# Patient Record
Sex: Female | Born: 1960 | Race: White | Hispanic: No | Marital: Married | Smoking: Never smoker
Health system: Southern US, Community
[De-identification: ages and names within clinical notes are randomized; demographics above are authoritative.]

## PROBLEM LIST (undated history)

## (undated) DIAGNOSIS — G35D Multiple sclerosis, unspecified: Secondary | ICD-10-CM

## (undated) DIAGNOSIS — I10 Essential (primary) hypertension: Secondary | ICD-10-CM

## (undated) DIAGNOSIS — M549 Dorsalgia, unspecified: Secondary | ICD-10-CM

## (undated) DIAGNOSIS — J45909 Unspecified asthma, uncomplicated: Secondary | ICD-10-CM

## (undated) DIAGNOSIS — F329 Major depressive disorder, single episode, unspecified: Secondary | ICD-10-CM

## (undated) DIAGNOSIS — M791 Myalgia, unspecified site: Secondary | ICD-10-CM

## (undated) DIAGNOSIS — H209 Unspecified iridocyclitis: Secondary | ICD-10-CM

## (undated) DIAGNOSIS — I314 Cardiac tamponade: Secondary | ICD-10-CM

## (undated) DIAGNOSIS — F32A Depression, unspecified: Secondary | ICD-10-CM

## (undated) DIAGNOSIS — E669 Obesity, unspecified: Secondary | ICD-10-CM

## (undated) DIAGNOSIS — G35 Multiple sclerosis: Secondary | ICD-10-CM

## (undated) DIAGNOSIS — G4761 Periodic limb movement disorder: Secondary | ICD-10-CM

## (undated) DIAGNOSIS — J90 Pleural effusion, not elsewhere classified: Secondary | ICD-10-CM

## (undated) HISTORY — DX: Myalgia, unspecified site: M79.10

## (undated) HISTORY — DX: Unspecified asthma, uncomplicated: J45.909

## (undated) HISTORY — DX: Obesity, unspecified: E66.9

## (undated) HISTORY — DX: Major depressive disorder, single episode, unspecified: F32.9

## (undated) HISTORY — DX: Essential (primary) hypertension: I10

## (undated) HISTORY — DX: Dorsalgia, unspecified: M54.9

## (undated) HISTORY — DX: Unspecified iridocyclitis: H20.9

## (undated) HISTORY — DX: Periodic limb movement disorder: G47.61

## (undated) HISTORY — PX: CERVICAL FUSION: SHX112

## (undated) HISTORY — DX: Depression, unspecified: F32.A

---

## 2010-08-11 HISTORY — PX: OTHER SURGICAL HISTORY: SHX169

## 2013-11-17 ENCOUNTER — Encounter: Payer: Self-pay | Admitting: Neurology

## 2013-11-21 ENCOUNTER — Encounter (INDEPENDENT_AMBULATORY_CARE_PROVIDER_SITE_OTHER): Payer: Self-pay

## 2013-11-21 ENCOUNTER — Ambulatory Visit (INDEPENDENT_AMBULATORY_CARE_PROVIDER_SITE_OTHER): Payer: BC Managed Care – PPO | Admitting: Neurology

## 2013-11-21 ENCOUNTER — Encounter: Payer: Self-pay | Admitting: Neurology

## 2013-11-21 VITALS — BP 121/82 | HR 67 | Ht 70.0 in | Wt 265.0 lb

## 2013-11-21 DIAGNOSIS — G35 Multiple sclerosis: Secondary | ICD-10-CM

## 2013-11-21 DIAGNOSIS — Z5181 Encounter for therapeutic drug level monitoring: Secondary | ICD-10-CM

## 2013-11-21 DIAGNOSIS — G35D Multiple sclerosis, unspecified: Secondary | ICD-10-CM

## 2013-11-21 NOTE — Progress Notes (Signed)
Reason for visit: Multiple sclerosis  [name] is a 53 y.o. female  History of present illness:  [name] is a 53 year old right-handed white female with a history of multiple sclerosis. The patient was initially diagnosed in 2000. The patient had an episode of left sided optic neuritis in 1996, and then she had left-sided sensory changes in 2000, and a lumbar puncture confirmed the diagnosis of MS. The patient has had multiple white matter lesions involving the brain and the spinal cord. The patient has been treated initially with Avonex for one year, and was switched to Rebif until December 2014. The patient had an exacerbation approximately one year ago associated with a cold sensation on the body and gait problems. The patient was found to have an enhancing lesion of the brain. The patient was switched to Tysabri, and she has been on this for a total of 6 doses. The patient is tolerating the medication well. The patient has a history of uveitis bilaterally, and some blurring of vision associated with this. The patient reports no current symptoms of numbness, or weakness or significant balance issues. The patient denies problems controlling the bowels or the bladder. The patient does have some neck discomfort at times. The patient has just recently moved to this area, and she does not have a primary care physician.  Past Medical History  Diagnosis Date  . Asthma   . Hypertension   . Depression   . Back pain   . Muscle pain   . Obesity   . Uveitis     Bilateral    Past Surgical History  Procedure Laterality Date  . Uterine ablasion  2012    Family History  Problem Relation Age of Onset  . Breast cancer Mother   . Hypertension Mother   . Diabetes Mother     Social history:  reports that she has never smoked. She has never used smokeless tobacco. She reports that she does not drink alcohol or use illicit drugs.  Medications:  No current outpatient prescriptions on file  prior to visit.   No current facility-administered medications on file prior to visit.      Allergies  Allergen Reactions  . Penicillins     ROS:  Out of a complete 14 system review of symptoms, the patient complains only of the following symptoms, and all other reviewed systems are negative.  Blurred vision Allergies Depression Disinterest in activities  Blood pressure 121/82, pulse 67, height 5\' 10"  (1.778 m), weight 265 lb (120.203 kg).  Physical Exam  General: The patient is alert and cooperative at the time of the examination.The patient is moderately obese.  Eyes: Pupils are equal, round, and reactive to light. Discs are flat bilaterally.  Neck: The neck is supple, no carotid bruits are noted.  Respiratory: The respiratory examination is clear.  Cardiovascular: The cardiovascular examination reveals a regular rate and rhythm, no obvious murmurs or rubs are noted.  Skin: Extremities are without significant edema.  Neurologic Exam  Mental status: The patient is alert and oriented x 3 at the time of the examination. The patient has apparent normal recent and remote memory, with an apparently normal attention span and concentration ability.  Cranial nerves: Facial symmetry is present. There is good sensation of the face to pinprick and soft touch bilaterally. The strength of the facial muscles and the muscles to head turning and shoulder shrug are normal bilaterally. Speech is well enunciated, no aphasia or dysarthria is noted. Extraocular movements  are full. Visual fields are full. The tongue is midline, and the patient has symmetric elevation of the soft palate. No obvious hearing deficits are noted.  Motor: The motor testing reveals 5 over 5 strength of all 4 extremities. Good symmetric motor tone is noted throughout.  Sensory: Sensory testing is intact to pinprick, soft touch, vibration sensation, and position sense on all 4 extremities. No evidence of extinction is  noted.  Coordination: Cerebellar testing reveals good finger-nose-finger and heel-to-shin bilaterally.  Gait and station: Gait is normal. Tandem gait is normal. Romberg is negative. No drift is seen.  Reflexes: Deep tendon reflexes are symmetric and normal bilaterally. Toes are downgoing bilaterally.   Assessment/Plan:  One. Multiple sclerosis  The patient will continue on the Tysabri. The patient will have blood work done today, and MRI evaluation of the brain and cervical spinal cord will be done. The patient will followup in 6 months. The patient is to contact our office if any new events occur. We will refer the patient to a primary care physician.    Jill Alexanders MD 11/21/2013 6:53 PM  [facility] [address] [place], [place] [address]  Phone [phone] Fax [phone]

## 2013-11-21 NOTE — Patient Instructions (Signed)
Multiple Sclerosis  Multiple sclerosis (MS) is a disease of the central nervous system. It leads to loss of the insulating covering of the nerves (myelin sheath) of your brain. When this happens, brain signals do not get transmitted properly or may not get transmitted at all. The symptoms of MS occur in episodes or attacks. These attacks may last weeks to months. There may be long periods of nearly no problems between attacks. The age of onset of MS varies.   CAUSES  The cause of MS is unknown. However, it is more common in the [place] than in the southern [place].  RISK FACTORS  There is a higher incidence of MS in women than in men. MS is not an inherited illness, although your risk of MS is higher if you have a relative with MS.  SIGNS AND SYMPTOMS   The symptoms of MS occur in episodes or attacks. These attacks may last weeks to months. There may be long periods of almost no symptoms between attacks.  The symptoms of MS vary. This is because of the many different ways it affects the central nervous system. The main symptoms of MS include:   Vision problems and eye pain.   Numbness.   Weakness.   Paralysis in your arms, hands, feet, and legs (extremities).   Balance problems.   Tremors.  DIAGNOSIS   Your health care provider can diagnose MS with the help of imaging exams and lab tests. These may include specialized X-ray exams and spinal fluid tests. The best imaging exam to confirm a diagnosis of MS is MRI.  TREATMENT   There is no known cure for MS, but there are medicines that can decrease the number and frequency of attacks. Steroids are often used for short-term relief. Physical and occupational therapy may also help.  HOME CARE INSTRUCTIONS    Take medicines as directed by your health care provider.   Exercise as directed by your health care provider.  SEEK MEDICAL CARE IF:  You begin to feel depressed.  SEEK IMMEDIATE MEDICAL CARE IF:   You develop paralysis.   You develop  problems with bladder, bowel, or sexual function.   You develop mental changes, such as forgetfulness or mood swings.   You have a seizure.  Document Released: 07/25/2000 Document Revised: 05/18/2013 Document Reviewed: 04/04/2013  ExitCare Patient Information 2014 [employer].

## 2013-11-22 LAB — COMPREHENSIVE METABOLIC PANEL
ALT: 37 IU/L — ABNORMAL HIGH (ref 0–32)
AST: 25 IU/L (ref 0–40)
Albumin/Globulin Ratio: 1.4 (ref 1.1–2.5)
Albumin: 4.4 g/dL (ref 3.5–5.5)
Alkaline Phosphatase: 100 IU/L (ref 39–117)
BUN/Creatinine Ratio: 24 — ABNORMAL HIGH (ref 9–23)
BUN: 17 mg/dL (ref 6–24)
CALCIUM: 9.7 mg/dL (ref 8.7–10.2)
CO2: 28 mmol/L (ref 18–29)
Chloride: 100 mmol/L (ref 97–108)
Creatinine, Ser: 0.71 mg/dL (ref 0.57–1.00)
GFR calc Af Amer: 115 mL/min/{1.73_m2} (ref >59)
GFR, EST NON AFRICAN AMERICAN: 100 mL/min/{1.73_m2} (ref >59)
GLOBULIN, TOTAL: 3.1 g/dL (ref 1.5–4.5)
GLUCOSE: 91 mg/dL (ref 65–99)
Potassium: 4 mmol/L (ref 3.5–5.2)
Sodium: 140 mmol/L (ref 134–144)
TOTAL PROTEIN: 7.5 g/dL (ref 6.0–8.5)
Total Bilirubin: 0.3 mg/dL (ref 0.0–1.2)

## 2013-11-22 LAB — CBC WITH DIFFERENTIAL
BASOS: 0 %
Basophils Absolute: 0 10*3/uL (ref 0.0–0.2)
EOS: 2 %
Eosinophils Absolute: 0.2 10*3/uL (ref 0.0–0.4)
HEMATOCRIT: 40.7 % (ref 34.0–46.6)
HEMOGLOBIN: 13.5 g/dL (ref 11.1–15.9)
Immature Grans (Abs): 0 10*3/uL (ref 0.0–0.1)
Immature Granulocytes: 0 %
Lymphocytes Absolute: 3.7 10*3/uL — ABNORMAL HIGH (ref 0.7–3.1)
Lymphs: 50 %
MCH: 29.5 pg (ref 26.6–33.0)
MCHC: 33.2 g/dL (ref 31.5–35.7)
MCV: 89 fL (ref 79–97)
MONOS ABS: 0.6 10*3/uL (ref 0.1–0.9)
Monocytes: 8 %
NEUTROS ABS: 3 10*3/uL (ref 1.4–7.0)
Neutrophils Relative %: 40 %
Platelets: 220 10*3/uL (ref 150–379)
RBC: 4.58 x10E6/uL (ref 3.77–5.28)
RDW: 13.7 % (ref 12.3–15.4)
WBC: 7.5 10*3/uL (ref 3.4–10.8)

## 2013-11-30 ENCOUNTER — Telehealth: Payer: Self-pay | Admitting: Neurology

## 2013-11-30 NOTE — Telephone Encounter (Signed)
The JC virus antibody panel came back as negative. We will recheck in about 6 months. The patient remains on Tysabri.

## 2013-12-01 ENCOUNTER — Ambulatory Visit (INDEPENDENT_AMBULATORY_CARE_PROVIDER_SITE_OTHER): Payer: BC Managed Care – PPO

## 2013-12-01 DIAGNOSIS — G35 Multiple sclerosis: Secondary | ICD-10-CM

## 2013-12-01 MED ORDER — GADOPENTETATE DIMEGLUMINE 469.01 MG/ML IV SOLN: 20.0000 mL | Freq: Once | INTRAVENOUS | Status: AC | PRN

## 2013-12-02 ENCOUNTER — Telehealth: Payer: Self-pay | Admitting: Neurology

## 2013-12-02 ENCOUNTER — Other Ambulatory Visit: Payer: Self-pay | Admitting: Neurology

## 2013-12-02 DIAGNOSIS — G35 Multiple sclerosis: Secondary | ICD-10-CM

## 2013-12-02 NOTE — Telephone Encounter (Signed)
I called patient. The MRI of the brain shows lesions consistent with multiple sclerosis, no enhancing lesions. MRI of the cervical spine was done, no Cord abnormalities are noted. The patient is supposed to go on Tysabri, she has not heard anything yet. I'll try to make sure that this gets set up.   MRI brain April 24th 2015:  IMPRESSION: Abnormal MRI scan the brain showing multiple periventricular, corpus callosal, brainstem and cerebellar white matter hyperintensities typical for multiple sclerosis. No enhancing lesions are noted. There mild changes of incidental paranasal sinusitis and left maxillary sinus polyp/mucus retention cyst is noted.

## 2013-12-05 ENCOUNTER — Telehealth: Payer: Self-pay | Admitting: Neurology

## 2013-12-05 NOTE — Telephone Encounter (Signed)
Left message for patient to see if she had signed Tysabri form.  It seems she has been on medication, but may need new enrollment for a different location since she has just moved into the area.

## 2013-12-06 ENCOUNTER — Telehealth: Payer: Self-pay | Admitting: Neurology

## 2013-12-06 ENCOUNTER — Telehealth: Payer: Self-pay | Admitting: *Deleted

## 2013-12-06 NOTE — Telephone Encounter (Signed)
Called patient regarding a desk she stated she had. Waiting on her to return my call IM checking to see if she took her desk home provider would like to get the disk.

## 2013-12-06 NOTE — Telephone Encounter (Signed)
I called the patient. MRI of the cervical spine does not show cord lesions. MRI the brain shows chronic multiple brain lesions consistent with MS. No enhancing lesions are noted. No comparison studies.   MRI cervical spine December 06 2013:  IMPRESSION: Abnormal MRI scan of the cervical spine showing prominent spondylitic changes at C4-5, C5-6 and C6-7 and broad-based disc osteophyte bulges and mild bilateral foraminal narrowing but without significant compression .No enhancing lesions are noted.   MRI brain 12/06/2013:   IMPRESSION: Abnormal MRI scan the brain showing multiple periventricular, corpus callosal, brainstem and cerebellar white matter hyperintensities typical for multiple sclerosis. No enhancing lesions are noted. There mild changes of incidental paranasal sinusitis and left maxillary sinus polyp/mucus retention cyst is noted.

## 2013-12-14 ENCOUNTER — Telehealth: Payer: Self-pay | Admitting: Neurology

## 2013-12-14 NOTE — Telephone Encounter (Signed)
Left another message for patient, that to change infusion sites a new form may need to be completed.  Asked her to return call.

## 2013-12-14 NOTE — Telephone Encounter (Signed)
Patient is returning call.  °

## 2013-12-26 NOTE — Telephone Encounter (Signed)
Left message that we received a "Change Prescriber authorization form by [employer] to be signed by the doctor.  Once she is authorized to receive Tysabri, I will call [facility] to set up her infusion if necessary.

## 2014-01-13 ENCOUNTER — Telehealth: Payer: Self-pay | Admitting: Neurology

## 2014-01-13 MED ORDER — DOXEPIN HCL 25 MG PO CAPS: 25.0000 mg | ORAL_CAPSULE | Freq: Every day | ORAL | Status: DC

## 2014-01-13 NOTE — Telephone Encounter (Signed)
I will call in the doxepin.

## 2014-01-13 NOTE — Telephone Encounter (Signed)
Patient requesting Rx for doxepin (SINEQUAN) 10 MG capsule 25 mg instead of 10 mg.  Thanks

## 2014-01-13 NOTE — Telephone Encounter (Signed)
I called back and spoke with the patient.  She said she gave [place] the wrong dose of Doxepin when she completed her forms.  States she takes 25mg  once daily, not 10mg  twice daily.  This has now been updated on med list.  Says she still does not have a PCP and would like to know if Dr Jannifer Franklin would prescribe this drug. Please advise.  Thank you.

## 2014-01-16 NOTE — Telephone Encounter (Signed)
I have faxed predetermination forms to [employer] [place] and called patient that we were waiting for a response.  Patient relayed that she changed insurance to [employer] on 01-09-14 and notified [facility] program.  I will also call [employer] and see if summary of benefits has been completed.

## 2014-01-17 ENCOUNTER — Telehealth: Payer: Self-pay | Admitting: Neurology

## 2014-01-17 NOTE — Telephone Encounter (Signed)
[name] with [employer] calling to verify the duration of Tysabri order received.  Please return call.  [phone]

## 2014-01-18 NOTE — Telephone Encounter (Signed)
Left message for Deneise Lever with [employer] and relayed that the patient has changed insurance companies effective 01-09-14.  When I sent determination of coverage form I was not aware [employer] was no longer effective.

## 2014-01-18 NOTE — Telephone Encounter (Signed)
Spoke to [employer] program and they have all the information for the new insurance, and a summary of benefits from [employer].  The patient has a $650 deductible to meet and yearly out of pocket of $4000.  They are going to contact patient for her to speak to their financial dept.    Also spoke to [employer] and representative, Langley Gauss, said the patient does not need a PA for the infusion or medication.  Left message for patient to return call, and inform me if she wants her first appointment at [facility] made, or to wait until she speaks to financial team at [employer].

## 2014-01-23 ENCOUNTER — Telehealth: Payer: Self-pay | Admitting: Nurse Practitioner

## 2014-01-23 NOTE — Telephone Encounter (Signed)
[name] with [place], calling [name] to check status for appointment at [facility] for [employer].  Please return call [phone].

## 2014-01-24 ENCOUNTER — Telehealth: Payer: Self-pay | Admitting: Neurology

## 2014-01-24 ENCOUNTER — Other Ambulatory Visit: Payer: Self-pay | Admitting: Neurology

## 2014-01-24 DIAGNOSIS — G35 Multiple sclerosis: Secondary | ICD-10-CM

## 2014-01-24 NOTE — Telephone Encounter (Signed)
Spoke to [place] with [place] and patient has enrolled in their financial assistance program, so she is ready to be scheduled.  I have set up her Tysabri infusion appointment at [facility] for 02-17-14 at 8am.  Left detailed message for patient about her appointment at short stay, and told her if she needed it changed, to call [facility] at [facility] directly at [phone].  Also told her to call with any other questions.

## 2014-01-24 NOTE — Telephone Encounter (Signed)
Patient has been scheduled for her Tysabri infusion at [facility] on 02-1014.  See phone note 01-23-14.

## 2014-01-24 NOTE — Telephone Encounter (Signed)
I will place the orders for Tysabri.

## 2014-01-24 NOTE — Telephone Encounter (Signed)
Patient has been scheduled for her first infusion of Tysabri at [facility] on 02-17-14.   Left message for patient with this appointment.  Short stay asked that orders be placed in Epic.

## 2014-01-27 ENCOUNTER — Other Ambulatory Visit: Payer: Self-pay | Admitting: Family Medicine

## 2014-01-27 DIAGNOSIS — Z1231 Encounter for screening mammogram for malignant neoplasm of breast: Secondary | ICD-10-CM

## 2014-02-06 ENCOUNTER — Telehealth: Payer: Self-pay | Admitting: Neurology

## 2014-02-06 MED ORDER — ARMODAFINIL 150 MG PO TABS: 150.0000 mg | ORAL_TABLET | Freq: Every day | ORAL | Status: DC

## 2014-02-06 NOTE — Telephone Encounter (Signed)
Patient calling to state that she would like Dr. Jannifer Franklin to refill her Rockie Neighbours (this would be a new prescription, Dr. Jannifer Franklin has never refilled this before). Please call patient and advise.

## 2014-02-06 NOTE — Telephone Encounter (Signed)
I will write a prescription for Nuvigil

## 2014-02-06 NOTE — Telephone Encounter (Signed)
Patient indicates she does not have a PCP/other provider at this time and would like to know if we will write Rx for Nuvigil.  Please advise.  Thank you.

## 2014-02-08 ENCOUNTER — Ambulatory Visit
Admission: RE | Admit: 2014-02-08 | Discharge: 2014-02-08 | Disposition: A | Discharge status: Home / Self Care | Payer: 59 | Source: Ambulatory Visit | Attending: Family Medicine | Admitting: Family Medicine

## 2014-02-08 DIAGNOSIS — Z1231 Encounter for screening mammogram for malignant neoplasm of breast: Secondary | ICD-10-CM

## 2014-02-17 ENCOUNTER — Encounter (HOSPITAL_COMMUNITY): Payer: Self-pay

## 2014-02-17 ENCOUNTER — Encounter (HOSPITAL_COMMUNITY)
Admission: RE | Admit: 2014-02-17 | Discharge: 2014-02-17 | Disposition: A | Discharge status: Home / Self Care | Payer: 59 | Source: Ambulatory Visit | Attending: Neurology | Admitting: Neurology

## 2014-02-17 ENCOUNTER — Other Ambulatory Visit (HOSPITAL_COMMUNITY): Payer: Self-pay | Admitting: Neurology

## 2014-02-17 VITALS — BP 136/80 | HR 63 | Temp 97.3°F | Resp 18 | Ht 70.0 in | Wt 257.4 lb

## 2014-02-17 DIAGNOSIS — G35 Multiple sclerosis: Secondary | ICD-10-CM

## 2014-02-17 DIAGNOSIS — D509 Iron deficiency anemia, unspecified: Secondary | ICD-10-CM | POA: Diagnosis present

## 2014-02-17 MED ORDER — SODIUM CHLORIDE 0.9 % IV SOLN
INTRAVENOUS | Status: DC
  Administered 2014-02-17: 09:00:00 via INTRAVENOUS

## 2014-02-17 MED ORDER — LORATADINE 10 MG PO TABS
10.0000 mg | ORAL_TABLET | ORAL | Status: DC
  Administered 2014-02-17: 10 mg via ORAL
  Filled 2014-02-17: qty 1

## 2014-02-17 MED ORDER — ACETAMINOPHEN 325 MG PO TABS
650.0000 mg | ORAL_TABLET | ORAL | Status: DC
  Administered 2014-02-17: 650 mg via ORAL
  Filled 2014-02-17: qty 2

## 2014-02-17 MED ORDER — SODIUM CHLORIDE 0.9 % IV SOLN
300.0000 mg | INTRAVENOUS | Status: DC
  Administered 2014-02-17: 300 mg via INTRAVENOUS
  Filled 2014-02-17: qty 15

## 2014-02-17 NOTE — Discharge Instructions (Signed)
ATTENTION:  If you are going to be 15 or more minutes late for your appointment, please call [phone] to make other arrangements for your treatment.  If you arrive early for your schedule appointment, you may have to wait until your scheduled time.  Natalizumab injection What is this medicine? NATALIZUMAB (na ta LIZ you mab) is used to treat relapsing multiple sclerosis. This drug is not a cure. It is also used to treat Crohn's disease. This medicine may be used for other purposes; ask your health care provider or pharmacist if you have questions. COMMON BRAND NAME(S): Tysabri What should I tell my health care provider before I take this medicine? They need to know if you have any of these conditions: -immune system problems -progressive multifocal leukoencephalopathy (PML) -an unusual or allergic reaction to natalizumab, other medicines, foods, dyes, or preservatives -pregnant or trying to get pregnant -breast-feeding How should I use this medicine? This medicine is for infusion into a vein. It is given by a health care professional in a hospital or clinic setting. A special MedGuide will be given to you by the pharmacist with each prescription and refill. Be sure to read this information carefully each time. Talk to your pediatrician regarding the use of this medicine in children. This medicine is not approved for use in children. Overdosage: If you think you have taken too much of this medicine contact a poison control center or emergency room at once. NOTE: This medicine is only for you. Do not share this medicine with others. What if I miss a dose? It is important not to miss your dose. Call your doctor or health care professional if you are unable to keep an appointment. What may interact with this medicine? -azathioprine -cyclosporine -interferon -6-mercaptopurine -methotrexate -steroid medicines like prednisone or cortisone -TNF-alpha inhibitors like adalimumab,  etanercept, and infliximab -vaccines This list may not describe all possible interactions. Give your health care provider a list of all the medicines, herbs, non-prescription drugs, or dietary supplements you use. Also tell them if you smoke, drink alcohol, or use illegal drugs. Some items may interact with your medicine. What should I watch for while using this medicine? Your condition will be monitored carefully while you are receiving this medicine. Visit your doctor for regular check ups. Tell your doctor or healthcare professional if your symptoms do not start to get better or if they get worse. Stay away from people who are sick. Call your doctor or health care professional for advice if you get a fever, chills or sore throat, or other symptoms of a cold or flu. Do not treat yourself. In some patients, this medicine may cause a serious brain infection that may cause death. If you have any problems seeing, thinking, speaking, walking, or standing, tell your doctor right away. If you cannot reach your doctor, get urgent medical care. What side effects may I notice from receiving this medicine? Side effects that you should report to your doctor or health care professional as soon as possible: -allergic reactions like skin rash, itching or hives, swelling of the face, lips, or tongue -breathing problems -changes in vision -chest pain -dark urine -depression, feelings of sadness -dizziness -general ill feeling or flu-like symptoms -irregular, missed, or painful menstrual periods -light-colored stools -loss of appetite, nausea -muscle weakness -problems with balance, talking, or walking -right upper belly pain -unusually weak or tired -yellowing of the eyes or skin Side effects that usually do not require medical attention (report to your  doctor or health care professional if they continue or are bothersome): -aches, pains -headache -stomach upset -tiredness This list may not describe  all possible side effects. Call your doctor for medical advice about side effects. You may report side effects to FDA at 1-800-FDA-1088. Where should I keep my medicine? This drug is given in a hospital or clinic and will not be stored at home. NOTE: This sheet is a summary. It may not cover all possible information. If you have questions about this medicine, talk to your doctor, pharmacist, or health care provider.  2015, Elsevier/Gold Standard. (2008-09-16 13:33:21)

## 2014-03-16 ENCOUNTER — Telehealth: Payer: Self-pay | Admitting: Neurology

## 2014-03-16 NOTE — Telephone Encounter (Signed)
Pt's information was given to [name], RN, Dr. Jannifer Franklin nurse. Please advise

## 2014-03-16 NOTE — Telephone Encounter (Signed)
Patient stated she's having Cataract Surgery on August 10 th.  She's also scheduled for Infusion at [facility] short stay on the August 10th as well.  [place] informed her our office need to call and reschedule appt.

## 2014-03-16 NOTE — Telephone Encounter (Signed)
Left message for patient that [facility] at [facility] already cancelled her Tysabri infusion appointment for 03-20-14.  Asked her to call our office after her cataract surgery to let us know how she is doing, or if she is on any antibiotics/medicines so we can clear her before she reschedules her next infusion appointment.

## 2014-03-20 ENCOUNTER — Encounter (HOSPITAL_COMMUNITY): Admission: RE | Admit: 2014-03-20 | Payer: 59 | Source: Ambulatory Visit

## 2014-04-03 ENCOUNTER — Telehealth: Payer: Self-pay | Admitting: Neurology

## 2014-04-03 NOTE — Telephone Encounter (Signed)
Patient would like to proceed with Infusions.  Please call anytime and advise, may leave message if not available.

## 2014-04-03 NOTE — Telephone Encounter (Signed)
Patient would like to proceed with Infusions. Please call anytime and advise, may leave message if not available.

## 2014-04-04 NOTE — Telephone Encounter (Signed)
Okay to start the Tysabri at this time.

## 2014-04-04 NOTE — Telephone Encounter (Signed)
Spoke to patient and she had her cataract surgery on 03-20-14 and has been for her follow up with the eye doctor.  She is not on any antibiodics and is ready to get rescheduled for her Tysabri.   Also contacted Dee at [facility] and she stated they still need a clearance from Dr. Jannifer Franklin before she has the infusion.

## 2014-04-05 NOTE — Telephone Encounter (Signed)
Spoke to [name] at [facility] and confirmed that the patient can start the Tysabri.  Also left message for patient that she is already scheduled for 04-24-14 and she can call [facility] directly to confirm or change the appointment.

## 2014-04-20 ENCOUNTER — Encounter (HOSPITAL_COMMUNITY)
Admission: RE | Admit: 2014-04-20 | Discharge: 2014-04-20 | Disposition: A | Discharge status: Home / Self Care | Payer: 59 | Source: Ambulatory Visit | Attending: Neurology | Admitting: Neurology

## 2014-04-20 ENCOUNTER — Encounter (HOSPITAL_COMMUNITY): Payer: Self-pay

## 2014-04-20 VITALS — BP 145/80 | HR 60 | Temp 97.8°F | Resp 18

## 2014-04-20 DIAGNOSIS — G35 Multiple sclerosis: Secondary | ICD-10-CM

## 2014-04-20 DIAGNOSIS — D509 Iron deficiency anemia, unspecified: Secondary | ICD-10-CM | POA: Diagnosis present

## 2014-04-20 MED ORDER — ACETAMINOPHEN 325 MG PO TABS
650.0000 mg | ORAL_TABLET | ORAL | Status: DC
  Administered 2014-04-20: 650 mg via ORAL
  Filled 2014-04-20: qty 2

## 2014-04-20 MED ORDER — SODIUM CHLORIDE 0.9 % IV SOLN
300.0000 mg | INTRAVENOUS | Status: DC
  Administered 2014-04-20: 300 mg via INTRAVENOUS
  Filled 2014-04-20: qty 15

## 2014-04-20 MED ORDER — SODIUM CHLORIDE 0.9 % IV SOLN
INTRAVENOUS | Status: DC
  Administered 2014-04-20: 08:00:00 via INTRAVENOUS

## 2014-04-20 MED ORDER — LORATADINE 10 MG PO TABS
10.0000 mg | ORAL_TABLET | ORAL | Status: DC
  Administered 2014-04-20: 10 mg via ORAL
  Filled 2014-04-20: qty 1

## 2014-04-20 NOTE — Progress Notes (Signed)
tysabri infusion and 1 hour observation is complete.  Pt tolerated well, vss, afebrile.  Pt d/c home ambulatory with friend.

## 2014-04-21 ENCOUNTER — Encounter (HOSPITAL_COMMUNITY): Payer: 59

## 2014-05-02 ENCOUNTER — Telehealth: Payer: Self-pay | Admitting: Neurology

## 2014-05-02 MED ORDER — DOXEPIN HCL 50 MG PO CAPS: 50.0000 mg | ORAL_CAPSULE | Freq: Every day | ORAL | Status: DC

## 2014-05-02 NOTE — Telephone Encounter (Signed)
Patient stated she's having a Neuropathy flare up, unable to sleep and experiencing a lot of pain.  Questioning if she could increase dosage of doxepin (SINEQUAN) 25 MG capsule.  Please call and advise, may leave detailed message on voicemail if not available.

## 2014-05-02 NOTE — Telephone Encounter (Signed)
I called patient. The patient is having some increased problems with pain and difficulty sleeping. We will go up on the doxepin taking 50 mg at night. In the past, the patient has been on much higher doses, and it has been helpful for her.

## 2014-05-02 NOTE — Telephone Encounter (Signed)
Please advise previous note. Thanks  °

## 2014-05-08 ENCOUNTER — Other Ambulatory Visit: Payer: Self-pay

## 2014-05-08 MED ORDER — DOXEPIN HCL 50 MG PO CAPS: 50.0000 mg | ORAL_CAPSULE | Freq: Every day | ORAL | Status: DC

## 2014-05-08 MED ORDER — ARMODAFINIL 150 MG PO TABS: 150.0000 mg | ORAL_TABLET | Freq: Every day | ORAL | Status: DC

## 2014-05-08 NOTE — Telephone Encounter (Signed)
Dr Willis is out of the office.  Forwarding request to [facility] for review.  

## 2014-05-09 NOTE — Telephone Encounter (Signed)
Rx signed and faxed.

## 2014-05-18 ENCOUNTER — Telehealth: Payer: Self-pay | Admitting: Neurology

## 2014-05-18 NOTE — Telephone Encounter (Signed)
Dee from short stay requesting a call back regarding patient and Tysabri, please return her call and advise.

## 2014-05-19 ENCOUNTER — Other Ambulatory Visit: Payer: Self-pay | Admitting: Neurology

## 2014-05-19 DIAGNOSIS — G35 Multiple sclerosis: Secondary | ICD-10-CM

## 2014-05-19 NOTE — Telephone Encounter (Signed)
[facility] called and needs orders for Tysabri in Epic, patient's appointment is 05-23-14.

## 2014-05-19 NOTE — Telephone Encounter (Signed)
I will write a prescription. The patient will be seen in the office next week.

## 2014-05-22 ENCOUNTER — Encounter (HOSPITAL_COMMUNITY): Payer: 59

## 2014-05-23 ENCOUNTER — Encounter: Payer: Self-pay | Admitting: Adult Health

## 2014-05-23 ENCOUNTER — Ambulatory Visit (INDEPENDENT_AMBULATORY_CARE_PROVIDER_SITE_OTHER): Payer: 59 | Admitting: Adult Health

## 2014-05-23 ENCOUNTER — Encounter (HOSPITAL_COMMUNITY): Payer: Self-pay

## 2014-05-23 ENCOUNTER — Encounter (HOSPITAL_COMMUNITY)
Admission: RE | Admit: 2014-05-23 | Discharge: 2014-05-23 | Disposition: A | Discharge status: Home / Self Care | Payer: 59 | Source: Ambulatory Visit | Attending: Neurology | Admitting: Neurology

## 2014-05-23 VITALS — BP 106/72 | HR 72 | Temp 98.7°F | Resp 14

## 2014-05-23 VITALS — BP 133/85 | HR 73 | Ht 68.5 in | Wt 267.0 lb

## 2014-05-23 DIAGNOSIS — Z5181 Encounter for therapeutic drug level monitoring: Secondary | ICD-10-CM

## 2014-05-23 DIAGNOSIS — G35 Multiple sclerosis: Secondary | ICD-10-CM | POA: Diagnosis present

## 2014-05-23 MED ORDER — NATALIZUMAB 300 MG/15ML IV CONC
300.0000 mg | INTRAVENOUS | Status: DC
  Administered 2014-05-23: 300 mg via INTRAVENOUS
  Filled 2014-05-23: qty 15

## 2014-05-23 MED ORDER — LORATADINE 10 MG PO TABS
10.0000 mg | ORAL_TABLET | ORAL | Status: DC
  Administered 2014-05-23: 10 mg via ORAL
  Filled 2014-05-23: qty 1

## 2014-05-23 MED ORDER — ACETAMINOPHEN 325 MG PO TABS
650.0000 mg | ORAL_TABLET | ORAL | Status: DC
  Administered 2014-05-23: 650 mg via ORAL
  Filled 2014-05-23: qty 2

## 2014-05-23 MED ORDER — SODIUM CHLORIDE 0.9 % IV SOLN
INTRAVENOUS | Status: AC
  Administered 2014-05-23: 11:00:00 via INTRAVENOUS

## 2014-05-23 NOTE — Progress Notes (Signed)
Scott: [name] DOB: 1961-05-05  REASON FOR VISIT: follow up HISTORY FROM: Scott  HISTORY OF PRESENT ILLNESS: [name] is a 53 year old female with a history of multiple sclerosis. She returns today for follow-up. She is currently taking tysabri and tolerating it well. She reports no new numbness or weakness. Denies any changes in her gait or balance. No changes with her bowels or bladder. 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 She had cataract surgery several weeks ago. She follows up with her ophthalmologist  regularly. She states that she did have an issue with insomnia but Dr. Jannifer Franklin increase Julie Doxepin and since then it has improved. Fatigue has remained Julie same. She got her 7th dose of tysabri today.   HISTORY 11/21/13 (CW): a 53 year old right-handed white female with a history of multiple sclerosis. [name] was initially diagnosed in 2000. [name] had an episode of left sided optic neuritis in 1996, and then she had left-sided sensory changes in 2000, and a lumbar puncture confirmed Julie diagnosis of MS. [name] has had multiple white matter lesions involving Julie brain and Julie spinal cord. [name] has been treated initially with Avonex for one year, and was switched to Rebif until December 2014. [name] had an exacerbation approximately one year ago associated with a cold sensation on Julie body and gait problems. [name] was found to have an enhancing lesion of Julie brain. [name] was switched to Tysabri, and she has been on this for a total of 6 doses. [name] is tolerating Julie medication well. [name] has a history of uveitis bilaterally, and some blurring of vision associated with this. [name] reports no current symptoms of numbness, or weakness or significant balance issues. [name] denies problems controlling Julie bowels or Julie bladder. [name] does have some  neck discomfort at times. [name] has just recently moved to this area, and she does not have a primary care physician.   REVIEW OF SYSTEMS: Full 14 system review of systems performed and notable only for:  Constitutional: N/A  Eyes: Blurred vision Ear/Nose/Throat: N/A  Skin: N/A  Cardiovascular: N/A  Respiratory: N/A  Gastrointestinal: N/A  Genitourinary: N/A Hematology/Lymphatic: N/A  Endocrine: N/A Musculoskeletal: Back pain, neck pain, next deafness Allergy/Immunology: Environmental allergies  Neurological: N/A Psychiatric: N/A Sleep: Insomnia, frequent waking, daytime sleepiness   ALLERGIES: Allergies  Allergen Reactions  . Penicillins     HOME MEDICATIONS: Outpatient Prescriptions Prior to Visit  Medication Sig Dispense Refill  . Armodafinil (NUVIGIL) 150 MG tablet Take 1 tablet (150 mg total) by mouth daily.  30 tablet  5  . doxepin (SINEQUAN) 50 MG capsule Take 1 capsule (50 mg total) by mouth at bedtime.  90 capsule  1  . Influenza Vac Split Quad (FLUZONE) 0.25 ML injection Inject 0.25 mLs into Julie muscle once.      Marland Kitchen lisinopril (PRINIVIL,ZESTRIL) 2.5 MG tablet Take 2.5 mg by mouth daily.      . natalizumab 300 mg in sodium chloride 0.9 % 100 mL Inject 300 mg into Julie vein every 28 (twenty-eight) days.      . citalopram (CELEXA) 10 MG tablet Take 10 mg by mouth daily.       Facility-Administered Medications Prior to Visit  Medication Dose Route Frequency Provider Last Rate Last Dose  . acetaminophen (TYLENOL) tablet 650 mg  650 mg Oral Q28 days Kathrynn Ducking,  MD   650 mg at 05/23/14 1035  . loratadine (CLARITIN) tablet 10 mg  10 mg Oral Q28 days Kathrynn Ducking, MD   10 mg at 05/23/14 1035  . natalizumab (TYSABRI) 300 mg in sodium chloride 0.9 % 100 mL IVPB  300 mg Intravenous Q28 days Kathrynn Ducking, MD   300 mg at 05/23/14 1039    PAST MEDICAL HISTORY: Past Medical History  Diagnosis Date  . Asthma   . Hypertension   . Depression   . Back pain     . Muscle pain   . Obesity   . Uveitis     Bilateral    PAST SURGICAL HISTORY: Past Surgical History  Procedure Laterality Date  . Uterine ablasion  2012    FAMILY HISTORY: Family History  Problem Relation Age of Onset  . Breast cancer Mother   . Hypertension Mother   . Diabetes Mother     SOCIAL HISTORY: History   Social History  . Marital Status: Single    Spouse Name: N/A    Number of Children: 0  . Years of Education: MA   Occupational History  . [employer]    Social History Main Topics  . Smoking status: Never Smoker   . Smokeless tobacco: Never Used  . Alcohol Use: No  . Drug Use: No     Comment: marijuana  . Sexual Activity: Not on file   Other Topics Concern  . Not on file   Social History Narrative  . No narrative on file      PHYSICAL EXAM  Filed Vitals:   05/23/14 1529  BP: 133/85  Pulse: 73  Height: 5' 8.5" (1.74 m)  Weight: 267 lb (121.11 kg)   Body mass index is 40 kg/(m^2).  Generalized: Well developed, in no acute distress   Neurological examination  Mentation: Alert oriented to time, place, history taking. Follows all commands speech and language fluent Cranial nerve II-XII: Pupils were equal round reactive to light. Extraocular movements were full, visual field were full on confrontational test. Facial sensation and strength were normal. Uvula tongue midline. Head turning and shoulder shrug  were normal and symmetric. Motor: Julie motor testing reveals 5 over 5 strength of all 4 extremities. Good symmetric motor tone is noted throughout.  Sensory: Sensory testing is intact to soft touch on all 4 extremities. No evidence of extinction is noted.  Coordination: Cerebellar testing reveals good finger-nose-finger and heel-to-shin bilaterally.  Gait and station: Gait is normal. Tandem gait is normal. Romberg is negative. No drift is seen.  Reflexes: Deep tendon reflexes are symmetric and normal bilaterally.    DIAGNOSTIC DATA (LABS, IMAGING, TESTING) - I reviewed Scott records, labs, notes, testing and imaging myself where available.  Lab Results  Component Value Date   WBC 7.5 11/21/2013   HGB 13.5 11/21/2013   HCT 40.7 11/21/2013   MCV 89 11/21/2013   PLT 220 11/21/2013      Component Value Date/Time   NA 140 11/21/2013 1524   K 4.0 11/21/2013 1524   CL 100 11/21/2013 1524   CO2 28 11/21/2013 1524   GLUCOSE 91 11/21/2013 1524   BUN 17 11/21/2013 1524   CREATININE 0.71 11/21/2013 1524   CALCIUM 9.7 11/21/2013 1524   PROT 7.5 11/21/2013 1524   AST 25 11/21/2013 1524   ALT 37* 11/21/2013 1524   ALKPHOS 100 11/21/2013 1524   BILITOT 0.3 11/21/2013 1524   GFRNONAA 100 11/21/2013 1524   GFRAA  115 11/21/2013 1524       ASSESSMENT AND PLAN 53 y.o. year old female  has a past medical history of Asthma; Hypertension; Depression; Back pain; Muscle pain; Obesity; and Uveitis. here with:  1. Multiple sclerosis  Overall [name] is doing very well on her Tysabri treatments. Her JC antivirus in April was negative. I will repeat blood work today. She does have a history of bilateral uveitis. She does followup with her ophthalmologist regularly. Her only recent complaint was insomnia. Her doxepin was increased and her insomnia has improved since then. If [name]'s symptoms worsen or she develops new symptoms she should let us know immediately. Otherwise she will followup in 6 months or sooner if needed.  Ward Givens, MSN, NP-C 05/23/2014, 3:29 PM [facility] [address], [place], [place] [address] [phone]  Note: This document was prepared with digital dictation and possible smart phrase technology. Any transcriptional errors that result from this process are unintentional.

## 2014-05-23 NOTE — Patient Instructions (Signed)

## 2014-05-23 NOTE — Progress Notes (Signed)
I have read the note, and I agree with the clinical assessment and plan.  [name],[name]   

## 2014-05-24 LAB — COMPREHENSIVE METABOLIC PANEL
ALK PHOS: 111 IU/L (ref 39–117)
ALT: 21 IU/L (ref 0–32)
AST: 19 IU/L (ref 0–40)
Albumin/Globulin Ratio: 1.3 (ref 1.1–2.5)
Albumin: 4 g/dL (ref 3.5–5.5)
BUN / CREAT RATIO: 13 (ref 9–23)
BUN: 11 mg/dL (ref 6–24)
CO2: 23 mmol/L (ref 18–29)
CREATININE: 0.84 mg/dL (ref 0.57–1.00)
Calcium: 9 mg/dL (ref 8.7–10.2)
Chloride: 102 mmol/L (ref 97–108)
GFR calc Af Amer: 93 mL/min/{1.73_m2} (ref >59)
GFR, EST NON AFRICAN AMERICAN: 81 mL/min/{1.73_m2} (ref >59)
GLOBULIN, TOTAL: 3 g/dL (ref 1.5–4.5)
Glucose: 77 mg/dL (ref 65–99)
Potassium: 4.3 mmol/L (ref 3.5–5.2)
Sodium: 141 mmol/L (ref 134–144)
Total Bilirubin: 0.3 mg/dL (ref 0.0–1.2)
Total Protein: 7 g/dL (ref 6.0–8.5)

## 2014-05-24 LAB — CBC WITH DIFFERENTIAL
BASOS ABS: 0 10*3/uL (ref 0.0–0.2)
Basos: 0 %
EOS ABS: 0.2 10*3/uL (ref 0.0–0.4)
Eos: 3 %
HCT: 34.8 % (ref 34.0–46.6)
Hemoglobin: 11.7 g/dL (ref 11.1–15.9)
Immature Grans (Abs): 0 10*3/uL (ref 0.0–0.1)
Immature Granulocytes: 0 %
Lymphocytes Absolute: 2 10*3/uL (ref 0.7–3.1)
Lymphs: 31 %
MCH: 29.8 pg (ref 26.6–33.0)
MCHC: 33.6 g/dL (ref 31.5–35.7)
MCV: 89 fL (ref 79–97)
MONOS ABS: 0.5 10*3/uL (ref 0.1–0.9)
Monocytes: 8 %
Neutrophils Absolute: 3.7 10*3/uL (ref 1.4–7.0)
Neutrophils Relative %: 58 %
PLATELETS: 223 10*3/uL (ref 150–379)
RBC: 3.93 x10E6/uL (ref 3.77–5.28)
RDW: 13.7 % (ref 12.3–15.4)
WBC: 6.5 10*3/uL (ref 3.4–10.8)

## 2014-05-31 ENCOUNTER — Telehealth: Payer: Self-pay | Admitting: Adult Health

## 2014-05-31 NOTE — Telephone Encounter (Signed)
I called the patient in regard to her lab work. I left a message for her to call our office. Her JCV antibody was negative.

## 2014-06-02 NOTE — Telephone Encounter (Signed)
I called the patient. Her JCV antibody was negative. She verbalized understanding.

## 2014-06-02 NOTE — Telephone Encounter (Signed)
Patient returning call to [facility], please return call and advise, she can be reached at [phone].

## 2014-06-11 ENCOUNTER — Emergency Department (HOSPITAL_COMMUNITY): Payer: 59

## 2014-06-11 ENCOUNTER — Encounter (HOSPITAL_COMMUNITY)
Admission: EM | Disposition: A | Discharge status: Home / Self Care | Payer: Self-pay | Source: Home / Self Care | Attending: Cardiovascular Disease

## 2014-06-11 ENCOUNTER — Inpatient Hospital Stay (HOSPITAL_COMMUNITY)
Admission: EM | Admit: 2014-06-11 | Discharge: 2014-06-20 | DRG: 272 | Disposition: A | Discharge status: Home / Self Care | Payer: 59 | Attending: Cardiovascular Disease | Admitting: Cardiovascular Disease

## 2014-06-11 ENCOUNTER — Encounter (HOSPITAL_COMMUNITY): Payer: Self-pay | Admitting: Emergency Medicine

## 2014-06-11 DIAGNOSIS — I309 Acute pericarditis, unspecified: Secondary | ICD-10-CM

## 2014-06-11 DIAGNOSIS — R0600 Dyspnea, unspecified: Secondary | ICD-10-CM

## 2014-06-11 DIAGNOSIS — Z6837 Body mass index (BMI) 37.0-37.9, adult: Secondary | ICD-10-CM | POA: Diagnosis not present

## 2014-06-11 DIAGNOSIS — I3139 Other pericardial effusion (noninflammatory): Secondary | ICD-10-CM

## 2014-06-11 DIAGNOSIS — I319 Disease of pericardium, unspecified: Secondary | ICD-10-CM

## 2014-06-11 DIAGNOSIS — D649 Anemia, unspecified: Secondary | ICD-10-CM | POA: Diagnosis present

## 2014-06-11 DIAGNOSIS — R635 Abnormal weight gain: Secondary | ICD-10-CM | POA: Diagnosis not present

## 2014-06-11 DIAGNOSIS — T380X5A Adverse effect of glucocorticoids and synthetic analogues, initial encounter: Secondary | ICD-10-CM | POA: Diagnosis not present

## 2014-06-11 DIAGNOSIS — E669 Obesity, unspecified: Secondary | ICD-10-CM | POA: Diagnosis present

## 2014-06-11 DIAGNOSIS — I314 Cardiac tamponade: Secondary | ICD-10-CM | POA: Diagnosis present

## 2014-06-11 DIAGNOSIS — R609 Edema, unspecified: Secondary | ICD-10-CM | POA: Diagnosis not present

## 2014-06-11 DIAGNOSIS — I313 Pericardial effusion (noninflammatory): Secondary | ICD-10-CM

## 2014-06-11 DIAGNOSIS — K5900 Constipation, unspecified: Secondary | ICD-10-CM | POA: Diagnosis not present

## 2014-06-11 DIAGNOSIS — D72829 Elevated white blood cell count, unspecified: Secondary | ICD-10-CM | POA: Diagnosis not present

## 2014-06-11 DIAGNOSIS — J069 Acute upper respiratory infection, unspecified: Secondary | ICD-10-CM | POA: Diagnosis not present

## 2014-06-11 DIAGNOSIS — B9789 Other viral agents as the cause of diseases classified elsewhere: Secondary | ICD-10-CM | POA: Diagnosis present

## 2014-06-11 DIAGNOSIS — J939 Pneumothorax, unspecified: Secondary | ICD-10-CM

## 2014-06-11 DIAGNOSIS — I10 Essential (primary) hypertension: Secondary | ICD-10-CM | POA: Diagnosis present

## 2014-06-11 DIAGNOSIS — G35 Multiple sclerosis: Secondary | ICD-10-CM | POA: Diagnosis present

## 2014-06-11 DIAGNOSIS — G35D Multiple sclerosis, unspecified: Secondary | ICD-10-CM | POA: Diagnosis present

## 2014-06-11 DIAGNOSIS — R0789 Other chest pain: Secondary | ICD-10-CM | POA: Diagnosis not present

## 2014-06-11 DIAGNOSIS — R079 Chest pain, unspecified: Secondary | ICD-10-CM

## 2014-06-11 HISTORY — PX: PERICARDIAL TAP: SHX5486

## 2014-06-11 HISTORY — DX: Cardiac tamponade: I31.4

## 2014-06-11 HISTORY — DX: Multiple sclerosis: G35

## 2014-06-11 HISTORY — DX: Multiple sclerosis, unspecified: G35.D

## 2014-06-11 LAB — CBC
HCT: 31.9 % — ABNORMAL LOW (ref 36.0–46.0)
Hemoglobin: 10.7 g/dL — ABNORMAL LOW (ref 12.0–15.0)
MCH: 29.4 pg (ref 26.0–34.0)
MCHC: 33.5 g/dL (ref 30.0–36.0)
MCV: 87.6 fL (ref 78.0–100.0)
PLATELETS: 278 10*3/uL (ref 150–400)
RBC: 3.64 MIL/uL — AB (ref 3.87–5.11)
RDW: 12.5 % (ref 11.5–15.5)
WBC: 9.4 10*3/uL (ref 4.0–10.5)

## 2014-06-11 LAB — I-STAT TROPONIN, ED: Troponin i, poc: 0 ng/mL (ref 0.00–0.08)

## 2014-06-11 LAB — CBC WITH DIFFERENTIAL/PLATELET
BASOS ABS: 0 10*3/uL (ref 0.0–0.1)
BASOS PCT: 0 % (ref 0–1)
Eosinophils Absolute: 0 10*3/uL (ref 0.0–0.7)
Eosinophils Relative: 0 % (ref 0–5)
HEMATOCRIT: 32.8 % — AB (ref 36.0–46.0)
Hemoglobin: 11.1 g/dL — ABNORMAL LOW (ref 12.0–15.0)
Lymphocytes Relative: 14 % (ref 12–46)
Lymphs Abs: 1.7 10*3/uL (ref 0.7–4.0)
MCH: 29.6 pg (ref 26.0–34.0)
MCHC: 33.8 g/dL (ref 30.0–36.0)
MCV: 87.5 fL (ref 78.0–100.0)
MONO ABS: 1.1 10*3/uL — AB (ref 0.1–1.0)
Monocytes Relative: 9 % (ref 3–12)
NEUTROS ABS: 9.6 10*3/uL — AB (ref 1.7–7.7)
Neutrophils Relative %: 77 % (ref 43–77)
PLATELETS: 330 10*3/uL (ref 150–400)
RBC: 3.75 MIL/uL — ABNORMAL LOW (ref 3.87–5.11)
RDW: 12.6 % (ref 11.5–15.5)
WBC: 12.5 10*3/uL — ABNORMAL HIGH (ref 4.0–10.5)

## 2014-06-11 LAB — COMPREHENSIVE METABOLIC PANEL
ALT: 32 U/L (ref 0–35)
AST: 24 U/L (ref 0–37)
Albumin: 3.2 g/dL — ABNORMAL LOW (ref 3.5–5.2)
Alkaline Phosphatase: 179 U/L — ABNORMAL HIGH (ref 39–117)
Anion gap: 13 (ref 5–15)
BUN: 13 mg/dL (ref 6–23)
CALCIUM: 8.9 mg/dL (ref 8.4–10.5)
CO2: 23 meq/L (ref 19–32)
Chloride: 98 mEq/L (ref 96–112)
Creatinine, Ser: 0.79 mg/dL (ref 0.50–1.10)
Glucose, Bld: 144 mg/dL — ABNORMAL HIGH (ref 70–99)
Potassium: 4.3 mEq/L (ref 3.7–5.3)
Sodium: 134 mEq/L — ABNORMAL LOW (ref 137–147)
Total Bilirubin: 0.4 mg/dL (ref 0.3–1.2)
Total Protein: 7.2 g/dL (ref 6.0–8.3)

## 2014-06-11 LAB — CREATININE, SERUM
CREATININE: 0.87 mg/dL (ref 0.50–1.10)
GFR calc non Af Amer: 76 mL/min — ABNORMAL LOW (ref >90)
GFR, EST AFRICAN AMERICAN: 88 mL/min — AB (ref >90)

## 2014-06-11 LAB — CREATININE, FLUID (PLEURAL, PERITONEAL, JP DRAINAGE): CREAT FL: 0.8 mg/dL

## 2014-06-11 LAB — BODY FLUID CELL COUNT WITH DIFFERENTIAL
LYMPHS FL: 24 %
Monocyte-Macrophage-Serous Fluid: 3 % — ABNORMAL LOW (ref 50–90)
Neutrophil Count, Fluid: 73 % — ABNORMAL HIGH (ref 0–25)
Total Nucleated Cell Count, Fluid: 4216 cu mm — ABNORMAL HIGH (ref 0–1000)

## 2014-06-11 LAB — MRSA PCR SCREENING: MRSA by PCR: NEGATIVE

## 2014-06-11 LAB — GLUCOSE, SEROUS FLUID: GLUCOSE FL: 88 mg/dL

## 2014-06-11 LAB — TYPE AND SCREEN
ABO/RH(D): A POS
Antibody Screen: NEGATIVE

## 2014-06-11 LAB — LACTATE DEHYDROGENASE, PLEURAL OR PERITONEAL FLUID: LD, Fluid: 941 U/L — ABNORMAL HIGH (ref 3–23)

## 2014-06-11 LAB — PRO B NATRIURETIC PEPTIDE: PRO B NATRI PEPTIDE: 253.1 pg/mL — AB (ref 0–125)

## 2014-06-11 LAB — ABO/RH: ABO/RH(D): A POS

## 2014-06-11 LAB — PROTEIN, PERICARDIAL FLUID: Protein, Pericardial Fluid: 5.8 g/dL

## 2014-06-11 LAB — LIPASE, BLOOD: Lipase: 12 U/L (ref 11–59)

## 2014-06-11 SURGERY — PERICARDIAL TAP
Anesthesia: Moderate Sedation

## 2014-06-11 MED ORDER — GI COCKTAIL ~~LOC~~
30.0000 mL | Freq: Once | ORAL | Status: AC
  Administered 2014-06-11: 30 mL via ORAL
  Filled 2014-06-11: qty 30

## 2014-06-11 MED ORDER — SODIUM CHLORIDE 0.9 % IJ SOLN
3.0000 mL | Freq: Two times a day (BID) | INTRAMUSCULAR | Status: DC
  Administered 2014-06-11 – 2014-06-15 (×9): 3 mL via INTRAVENOUS

## 2014-06-11 MED ORDER — SODIUM CHLORIDE 0.9 % IV BOLUS (SEPSIS)
1000.0000 mL | Freq: Once | INTRAVENOUS | Status: AC
  Administered 2014-06-11: 1000 mL via INTRAVENOUS

## 2014-06-11 MED ORDER — PREDNISOLONE ACETATE 1 % OP SUSP
1.0000 [drp] | Freq: Three times a day (TID) | OPHTHALMIC | Status: DC
  Administered 2014-06-11 – 2014-06-20 (×18): 1 [drp] via OPHTHALMIC
  Filled 2014-06-11: qty 1

## 2014-06-11 MED ORDER — ACETAMINOPHEN 325 MG PO TABS
650.0000 mg | ORAL_TABLET | Freq: Four times a day (QID) | ORAL | Status: DC | PRN
  Administered 2014-06-11 – 2014-06-12 (×2): 650 mg via ORAL
  Filled 2014-06-11 (×2): qty 2

## 2014-06-11 MED ORDER — LIDOCAINE HCL (PF) 1 % IJ SOLN
INTRAMUSCULAR | Status: AC
  Filled 2014-06-11: qty 30

## 2014-06-11 MED ORDER — DOXEPIN HCL 50 MG PO CAPS
50.0000 mg | ORAL_CAPSULE | Freq: Every day | ORAL | Status: DC
Start: 2014-06-11 — End: 2014-06-20
  Administered 2014-06-11 – 2014-06-19 (×9): 50 mg via ORAL
  Filled 2014-06-11 (×12): qty 1

## 2014-06-11 MED ORDER — KETOROLAC TROMETHAMINE 60 MG/2ML IM SOLN: 60.0000 mg | Freq: Once | INTRAMUSCULAR | Status: DC

## 2014-06-11 MED ORDER — IBUPROFEN 600 MG PO TABS
600.0000 mg | ORAL_TABLET | Freq: Three times a day (TID) | ORAL | Status: DC
  Administered 2014-06-11 – 2014-06-20 (×25): 600 mg via ORAL
  Filled 2014-06-11 (×30): qty 1

## 2014-06-11 MED ORDER — MORPHINE SULFATE 2 MG/ML IJ SOLN
2.0000 mg | INTRAMUSCULAR | Status: DC | PRN
  Administered 2014-06-11 – 2014-06-12 (×3): 2 mg via INTRAVENOUS
  Filled 2014-06-11 (×4): qty 1

## 2014-06-11 MED ORDER — HEPARIN SODIUM (PORCINE) 5000 UNIT/ML IJ SOLN
5000.0000 [IU] | Freq: Three times a day (TID) | INTRAMUSCULAR | Status: DC
  Administered 2014-06-11 – 2014-06-16 (×13): 5000 [IU] via SUBCUTANEOUS
  Filled 2014-06-11 (×17): qty 1

## 2014-06-11 MED ORDER — MIDAZOLAM HCL 2 MG/2ML IJ SOLN
INTRAMUSCULAR | Status: AC
  Filled 2014-06-11: qty 2

## 2014-06-11 MED ORDER — KETOROLAC TROMETHAMINE 30 MG/ML IJ SOLN
30.0000 mg | Freq: Once | INTRAMUSCULAR | Status: AC
  Administered 2014-06-11: 30 mg via INTRAVENOUS
  Filled 2014-06-11: qty 1

## 2014-06-11 MED ORDER — HEPARIN (PORCINE) IN NACL 2-0.9 UNIT/ML-% IJ SOLN
INTRAMUSCULAR | Status: AC
  Filled 2014-06-11: qty 500

## 2014-06-11 MED ORDER — FENTANYL CITRATE 0.05 MG/ML IJ SOLN
INTRAMUSCULAR | Status: AC
  Filled 2014-06-11: qty 2

## 2014-06-11 MED ORDER — OXYCODONE HCL 5 MG PO TABS
5.0000 mg | ORAL_TABLET | ORAL | Status: DC | PRN
  Administered 2014-06-11 – 2014-06-13 (×5): 5 mg via ORAL
  Filled 2014-06-11 (×5): qty 1

## 2014-06-11 MED ORDER — ESCITALOPRAM OXALATE 10 MG PO TABS
10.0000 mg | ORAL_TABLET | Freq: Every day | ORAL | Status: DC
  Administered 2014-06-12 – 2014-06-20 (×8): 10 mg via ORAL
  Filled 2014-06-11 (×9): qty 1

## 2014-06-11 MED ORDER — ACETAMINOPHEN 650 MG RE SUPP: 650.0000 mg | Freq: Four times a day (QID) | RECTAL | Status: DC | PRN

## 2014-06-11 MED ORDER — ARMODAFINIL 150 MG PO TABS
150.0000 mg | ORAL_TABLET | Freq: Every day | ORAL | Status: DC
  Filled 2014-06-11: qty 1

## 2014-06-11 NOTE — Progress Notes (Signed)
  Echocardiogram 2D Echocardiogram has been performed.  Darlina Sicilian M 06/11/2014, 4:39 PM

## 2014-06-11 NOTE — Plan of Care (Signed)
Problem: Phase I Progression Outcomes Goal: Hemodynamically stable Outcome: Completed/Met Date Met:  06/11/14     

## 2014-06-11 NOTE — CV Procedure (Signed)
   Cardiac Catheterization Procedure Note  Name: [name] MRN: [id number] DOB: Dec 25, 1960  Procedure: Fluoroscopic directed pericardiocentesis  Indication: Cardiac tamponade   Procedural details: The sub-xyphoid area was prepped, draped, and anesthetized with 1% lidocaine. An LP needle with stilette was used after careful anatomic landmark assessment. After the needle was passed under the ribcage, it was directed to the left posterior shoulder under fluoroscopic guidance. The pericardial sac was entered via a single puncture and after dilating a tract, a catheter was advanced into the pericardial space. Contrast was injected to confirm catheter position and this clearly demonstrated appropriate location in the pericardium. Approximately 1 liter of bloody pericardial fluid was withdrawn and the catheter was then put to suction with a wound-vac. A bedside echo was used to confirm resolution of the effusion at the completion of the procedure. There were no immediate procedural complications. The patient was transferred to the CICU for further monitoring.  Final Conclusions:  Successful pericardiocentesis under fluoroscopic guidance  Recommendations: Leave tube in place until tomorrow. D/C tube tomorrow if drainage is not excessive. Repeat echo 48 hours.  Sherren Mocha MD 06/11/2014, 2:45 PM

## 2014-06-11 NOTE — Interval H&P Note (Signed)
History and Physical Interval Note:  06/11/2014 1:29 PM  [name]  has presented today for surgery, with the diagnosis of Chest pain  The various methods of treatment have been discussed with the patient and family. After consideration of risks, benefits and other options for treatment, the patient has consented to  Procedure(s): PERICARDIAL TAP (N/A) as a surgical intervention .  The patient's history has been reviewed, patient examined, no change in status, stable for surgery.  I have reviewed the patient's chart and labs.  Questions were answered to the patient's satisfaction.    Patient was interviewed and evaluated. I discussed her case with Dr Sallyanne Kuster. I personally performed a bedside echo imaging to confirm subcostal access into the pericardial effusion. The patient does have a large pericardial effusion easily visualized from the subcostal region. Clinically she is in cardiac tamponade. We will proceed urgently with pericardiocentesis in the cardiac Cath Lab. I have reviewed the specific risks, indications, and alternatives with the patient. She understands and agrees to proceed.  Sherren Mocha MD

## 2014-06-11 NOTE — ED Notes (Signed)
Per EMS: called for dizziness, chest pain, weakness, sob. Upon arrival, EMS found patient to be pale, diaphoretic, and nauseated.  Found to have low bp 86 systolic palpated.  HR 120 sinus tach.  Given 500 cc bolus en route, BP is now 107/64 HR 106, afebrile.  Denies recent fevers or chills.  Given 4 zofran en route.    Per Patient:  Last Saturday pt had CP in center of chest, went to UC was found to have residual bronchitis, started on zpac and given cough syrup.  Tuesday pt had lots of fatigue and still cp.  Wednesday, Thursday, and Friday patient reports feeling better.  Saturday patient reports nausea and CP radiating to both sides and back, this morning patient woke up at 0800 feeling weak, dizzy, nauseated, sob, with continuing chest pain.  Patient was able to ambulate to ambulance.  CBG 179, no hx of diabetes.

## 2014-06-11 NOTE — H&P (Signed)
Chief Complaint:  Pericardial tamponade  HPI:  This is a 53 y.o. female with a past medical history significant for multiple sclerosis and HTN, no cardiac illness, presents with extreme fatigue, presyncope and dyspnea after a week or so of "bronchitis"  - mostly pleuritic pain, less cough, no upper airway symptoms. Presented with hypotension and tachycardia. Today had profuse diaphoresis, presyncope and severe dyspnea. Improved in ED with IV fluids.  PMHx:  Past Medical History  Diagnosis Date  . Asthma   . Hypertension   . Depression   . Back pain   . Muscle pain   . Obesity   . Uveitis     Bilateral  . MS (multiple sclerosis)     Past Surgical History  Procedure Laterality Date  . Uterine ablasion  2012    FAMHx:  Family History  Problem Relation Age of Onset  . Breast cancer Mother   . Hypertension Mother   . Diabetes Mother     SOCHx:   reports that she has never smoked. She has never used smokeless tobacco. She reports that she drinks alcohol. She reports that she does not use illicit drugs.  ALLERGIES:  Allergies  Allergen Reactions  . Penicillins     ROS: Constitutional: positive for fatigue, negative for chills, fevers and positive for sweats Eyes: negative Ears, nose, mouth, throat, and face: negative for nasal congestion, sore throat and voice change Respiratory: positive for dyspnea on exertion, negative for cough, hemoptysis, sputum and wheezing Cardiovascular: positive for chest pressure/discomfort, dyspnea, fatigue and near syncope, negative for palpitations and syncope Gastrointestinal: negative for change in bowel habits, dysphagia, melena, nausea, reflux symptoms and vomiting Genitourinary:negative Integument/breast: negative Hematologic/lymphatic: negative for bleeding, easy bruising and petechiae Musculoskeletal:negative Neurological: negative, history of MS Behavioral/Psych: negative Endocrine: negative for diabetic symptoms including  polydipsia, polyphagia and polyuria Allergic/Immunologic: negative  HOME MEDS: No current facility-administered medications on file prior to encounter.   Current Outpatient Prescriptions on File Prior to Encounter  Medication Sig Dispense Refill  . Armodafinil (NUVIGIL) 150 MG tablet Take 1 tablet (150 mg total) by mouth daily. 30 tablet 5  . doxepin (SINEQUAN) 50 MG capsule Take 1 capsule (50 mg total) by mouth at bedtime. 90 capsule 1  . escitalopram (LEXAPRO) 10 MG tablet Take 10 mg by mouth daily.    Marland Kitchen lisinopril (PRINIVIL,ZESTRIL) 2.5 MG tablet Take 2.5 mg by mouth daily.    . natalizumab 300 mg in sodium chloride 0.9 % 100 mL Inject 300 mg into the vein every 28 (twenty-eight) days.    . prednisoLONE acetate (PRED FORTE) 1 % ophthalmic suspension Place 1 drop into both eyes 3 (three) times daily.    . Influenza Vac Split Quad (FLUZONE) 0.25 ML injection Inject 0.25 mLs into the muscle once.       (Not in a hospital admission)  LABS/IMAGING: Results for orders placed or performed during the hospital encounter of 06/11/14 (from the past 48 hour(s))  CBC with Differential     Status: Abnormal   Collection Time: 06/11/14  9:57 AM  Result Value Ref Range   WBC 12.5 (H) 4.0 - 10.5 K/uL   RBC 3.75 (L) 3.87 - 5.11 MIL/uL   Hemoglobin 11.1 (L) 12.0 - 15.0 g/dL   HCT 32.8 (L) 36.0 - 46.0 %   MCV 87.5 78.0 - 100.0 fL   MCH 29.6 26.0 - 34.0 pg   MCHC 33.8 30.0 - 36.0 g/dL   RDW 12.6 11.5 - 15.5 %  Platelets 330 150 - 400 K/uL   Neutrophils Relative % 77 43 - 77 %   Neutro Abs 9.6 (H) 1.7 - 7.7 K/uL   Lymphocytes Relative 14 12 - 46 %   Lymphs Abs 1.7 0.7 - 4.0 K/uL   Monocytes Relative 9 3 - 12 %   Monocytes Absolute 1.1 (H) 0.1 - 1.0 K/uL   Eosinophils Relative 0 0 - 5 %   Eosinophils Absolute 0.0 0.0 - 0.7 K/uL   Basophils Relative 0 0 - 1 %   Basophils Absolute 0.0 0.0 - 0.1 K/uL  Pro b natriuretic peptide     Status: Abnormal   Collection Time: 06/11/14  9:57 AM  Result  Value Ref Range   Pro B Natriuretic peptide (BNP) 253.1 (H) 0 - 125 pg/mL  Comprehensive metabolic panel     Status: Abnormal   Collection Time: 06/11/14  9:57 AM  Result Value Ref Range   Sodium 134 (L) 137 - 147 mEq/L   Potassium 4.3 3.7 - 5.3 mEq/L   Chloride 98 96 - 112 mEq/L   CO2 23 19 - 32 mEq/L   Glucose, Bld 144 (H) 70 - 99 mg/dL   BUN 13 6 - 23 mg/dL   Creatinine, Ser 0.79 0.50 - 1.10 mg/dL   Calcium 8.9 8.4 - 10.5 mg/dL   Total Protein 7.2 6.0 - 8.3 g/dL   Albumin 3.2 (L) 3.5 - 5.2 g/dL   AST 24 0 - 37 U/L   ALT 32 0 - 35 U/L   Alkaline Phosphatase 179 (H) 39 - 117 U/L   Total Bilirubin 0.4 0.3 - 1.2 mg/dL   GFR calc non Af Amer >90 >90 mL/min   GFR calc Af Amer >90 >90 mL/min    Comment: (NOTE) The eGFR has been calculated using the CKD EPI equation. This calculation has not been validated in all clinical situations. eGFR's persistently <90 mL/min signify possible Chronic Kidney Disease.    Anion gap 13 5 - 15  Lipase, blood     Status: None   Collection Time: 06/11/14  9:57 AM  Result Value Ref Range   Lipase 12 11 - 59 U/L  I-stat troponin, ED     Status: None   Collection Time: 06/11/14 10:28 AM  Result Value Ref Range   Troponin i, poc 0.00 0.00 - 0.08 ng/mL   Comment 3            Comment: Due to the release kinetics of cTnI, a negative result within the first hours of the onset of symptoms does not rule out myocardial infarction with certainty. If myocardial infarction is still suspected, repeat the test at appropriate intervals.    Dg Chest 2 View  06/11/2014   CLINICAL DATA:  Epigastric pain  EXAM: CHEST  2 VIEW  COMPARISON:  None.  FINDINGS: Moderate cardiomegaly. Small bilateral pleural effusions and bibasilar atelectasis. Normal vascularity.  IMPRESSION: Cardiomegaly without edema.  Small bilateral pleural effusions and bibasilar atelectasis.   Electronically Signed   By: Maryclare Bean M.D.   On: 06/11/2014 11:17    VITALS: Blood pressure 102/88,  pulse 96, temperature 98.6 F (37 C), temperature source Oral, resp. rate 16, height $RemoveBe'5\' 10"'[id number]$  (1.778 m), weight 250 lb (113.399 kg), SpO2 96 %.  EXAM:  General: Alert, oriented x3, no distress Head: no evidence of trauma, PERRL, EOMI, no exophtalmos or lid lag, no myxedema, no xanthelasma; normal ears, nose and oropharynx Neck: hard to see jugular venous pulsations and  no hepatojugular reflux; brisk carotid pulses without delay and no carotid bruits Chest: clear to auscultation, no signs of consolidation by percussion or palpation, normal fremitus, symmetrical and full respiratory excursions Cardiovascular: normal position and quality of the apical impulse, regular rhythm, distant first heart sound and second heart sounds, no rubs or gallops, no murmur Abdomen: no tenderness or distention, no masses by palpation, no abnormal pulsatility or arterial bruits, normal bowel sounds, no hepatosplenomegaly Extremities: no clubbing, cyanosis or edema; 2+ radial, ulnar and brachial pulses bilaterally; 2+ right femoral, posterior tibial and dorsalis pedis pulses; 2+ left femoral, posterior tibial and dorsalis pedis pulses; no subclavian or femoral bruits Neurological: grossly nonfocal   IMPRESSION: Pericardial tamponade, most likely secondary to acute (viral?) pericarditis Hemodynamics improved after fluids, but BP still low and HR fast  PLAN: Urgent pericardiocentesis. NSAIDS and colchicine. Screen for malignancy: cytology, maybe additional chest imaging.  Sanda Klein, MD, [facility] [phone] office [phone] pager  06/11/2014, 12:59 PM

## 2014-06-11 NOTE — ED Notes (Signed)
Reported to [place], [place] . Pt. Reports that her pain was unchanged after receiving GI cocktail

## 2014-06-11 NOTE — ED Notes (Signed)
Cath lab ready for patient, zoll attached, patient transported by this nurse and tech amanda.

## 2014-06-11 NOTE — ED Provider Notes (Signed)
CSN: [id number]     Arrival date & time 06/11/14  0910 History   First MD Initiated Contact with Patient 06/11/14 [phone]     Chief Complaint  Patient presents with  . Chest Pain  . Shortness of Breath  . Dizziness  . Nausea     (Consider location/radiation/quality/duration/timing/severity/associated sxs/prior Treatment) HPI  [name] is a 53 y.o. female with PMH of HTN, MS, asthma, depression presenting with chest pain/epigastric ache/ sharp pain that started this morning around 8am. Associated with lightheadedness, generalized weakness and initial SOB. Per EMS, pt found to be pale, diaphoretic and nauseated. With bp 86 systolic and tachycardic to 120. Pt given 500 bolus. Pt also took 4 mg of zofran and 325mg  aspirin with improvement of nausea and mild improvement of chest pain. Pain not worse with activity or eating. Worse with deep breathing, coughing, lying down and movement. Pt with history of similar chest pain one week ago and presented to UC and diagnosed with bronchitis and given zpac and cough syrup which helped pain. Pt never seen a cardiologist. Pt with history of HTN, no hyperlipidemia, smoking, FM of sudden cardiac death, history of DVT, PE, malignancy, unilateral leg swelling or hemopytsis.    Past Medical History  Diagnosis Date  . Asthma   . Hypertension   . Depression   . Back pain   . Muscle pain   . Obesity   . Uveitis     Bilateral  . MS (multiple sclerosis)   . Cardiac/pericardial tamponade 06/11/2014   Past Surgical History  Procedure Laterality Date  . Uterine ablasion  2012   Family History  Problem Relation Age of Onset  . Breast cancer Mother   . Hypertension Mother   . Diabetes Mother    History  Substance Use Topics  . Smoking status: Never Smoker   . Smokeless tobacco: Never Used  . Alcohol Use: Yes     Comment: occasional   OB History    No data available     Review of Systems  Constitutional: Negative for fever and chills.  HENT:  Negative for congestion and rhinorrhea.   Eyes: Negative for visual disturbance.  Respiratory: Positive for cough. Negative for shortness of breath.   Cardiovascular: Positive for chest pain. Negative for palpitations and leg swelling.  Gastrointestinal: Positive for nausea. Negative for vomiting, diarrhea and blood in stool.  Genitourinary: Negative for dysuria and hematuria.  Musculoskeletal: Negative for back pain and gait problem.  Skin: Negative for rash.  Neurological: Negative for weakness and headaches.      Allergies  Penicillins  Home Medications   Prior to Admission medications   Medication Sig Start Date End Date Taking? Authorizing Provider  Armodafinil (NUVIGIL) 150 MG tablet Take 1 tablet (150 mg total) by mouth daily. 05/08/14  Yes Hulen Luster, DO  doxepin (SINEQUAN) 50 MG capsule Take 1 capsule (50 mg total) by mouth at bedtime. 05/08/14  Yes Hulen Luster, DO  escitalopram (LEXAPRO) 10 MG tablet Take 10 mg by mouth daily.   Yes Historical Provider, MD  lisinopril (PRINIVIL,ZESTRIL) 2.5 MG tablet Take 2.5 mg by mouth daily.   Yes Historical Provider, MD  natalizumab 300 mg in sodium chloride 0.9 % 100 mL Inject 300 mg into the vein every 28 (twenty-eight) days.   Yes Historical Provider, MD  prednisoLONE acetate (PRED FORTE) 1 % ophthalmic suspension Place 1 drop into both eyes 3 (three) times daily.   Yes Historical Provider, MD  Influenza Vac  Split Quad (FLUZONE) 0.25 ML injection Inject 0.25 mLs into the muscle once.    Historical Provider, MD   BP 113/86 mmHg  Pulse 88  Temp(Src) 98.6 F (37 C) (Oral)  Resp 12  Ht 5\' 10"  (1.778 m)  Wt 250 lb (113.399 kg)  BMI 35.87 kg/m2  SpO2 100% Physical Exam  Constitutional: She appears well-developed and well-nourished. No distress.  HENT:  Head: Normocephalic and atraumatic.  Mouth/Throat: Oropharynx is clear and moist. No oropharyngeal exudate.  Eyes: Conjunctivae and EOM are normal. Right eye exhibits  no discharge. Left eye exhibits no discharge.  Neck: Normal range of motion.  Cardiovascular: Normal rate, regular rhythm, normal heart sounds and intact distal pulses.   No leg swelling or tenderness. Negative Homan's sign. CP reproducible  Pulmonary/Chest: Effort normal and breath sounds normal. No respiratory distress. She has no wheezes.  Abdominal: Soft. Bowel sounds are normal. She exhibits no distension.  Epigastric tenderness without rebound, guarding or rigidity. No CVA tenderness.  Musculoskeletal:  No midline back tenderness, step off or crepitus. Right Left sided lower back tenderness.   Lymphadenopathy:    She has no cervical adenopathy.  Neurological: She is alert. She exhibits normal muscle tone. Coordination normal.  Skin: Skin is warm and dry. She is not diaphoretic.  Nursing note and vitals reviewed.   ED Course  Procedures (including critical care time) Labs Review Labs Reviewed  CBC WITH DIFFERENTIAL - Abnormal; Notable for the following:    WBC 12.5 (*)    RBC 3.75 (*)    Hemoglobin 11.1 (*)    HCT 32.8 (*)    Neutro Abs 9.6 (*)    Monocytes Absolute 1.1 (*)    All other components within normal limits  PRO B NATRIURETIC PEPTIDE - Abnormal; Notable for the following:    Pro B Natriuretic peptide (BNP) 253.1 (*)    All other components within normal limits  COMPREHENSIVE METABOLIC PANEL - Abnormal; Notable for the following:    Sodium 134 (*)    Glucose, Bld 144 (*)    Albumin 3.2 (*)    Alkaline Phosphatase 179 (*)    All other components within normal limits  LACTATE DEHYDROGENASE, BODY FLUID - Abnormal; Notable for the following:    LD, Fluid 941 (*)    All other components within normal limits  BODY FLUID CELL COUNT WITH DIFFERENTIAL - Abnormal; Notable for the following:    Color, Fluid RED (*)    Appearance, Fluid TURBID (*)    WBC, Fluid 4216 (*)    Neutrophil Count, Fluid 73 (*)    Monocyte-Macrophage-Serous Fluid 3 (*)    All other components  within normal limits  MRSA PCR SCREENING  BODY FLUID CULTURE  BODY FLUID CULTURE  LIPASE, BLOOD  CREATININE, BODY FLUID  GLUCOSE, SEROUS FLUID  PROTEIN, PERICARDIAL FLUID  AMYLASE, PLEURAL FLUID  OTHER BODY FLUID CHEMISTRY  PH, BODY FLUID  BODY FLUID CELL COUNT WITH DIFFERENTIAL  CBC  CREATININE, SERUM  I-STAT TROPOININ, ED  I-STAT TROPOININ, ED  TYPE AND SCREEN  CYTOLOGY - NON PAP    Imaging Review Dg Chest 2 View  06/11/2014   CLINICAL DATA:  Epigastric pain  EXAM: CHEST  2 VIEW  COMPARISON:  None.  FINDINGS: Moderate cardiomegaly. Small bilateral pleural effusions and bibasilar atelectasis. Normal vascularity.  IMPRESSION: Cardiomegaly without edema.  Small bilateral pleural effusions and bibasilar atelectasis.   Electronically Signed   By: Maryclare Bean M.D.   On: 06/11/2014 11:17  EKG Interpretation None       Date: 06/11/2014  Rate: 105  Rhythm: sinus tachycardia  QRS Axis: normal  Intervals: normal  ST/T Wave abnormalities: normal  Conduction Disutrbances:none  Narrative Interpretation: low voltage, extremity and precordial leads  Old EKG Reviewed: changes noted above    MDM   Final diagnoses:  Chest pain  Acute dyspnea  Pericardial effusion  Tamponade   Pt without history of chest pain presenting after one week of bronchitis with hypotension and tachycardia, per EMS, chest pain/epigastric pain worse with deep breathing. Pt given IV fluids and BP without hypotension and tachycardia stabilized in ED. Pt given gi cocktail and toradol without relief. EKG with sinus tachycardia and low voltage without other changes and troponin negative. CXR with moderate cardiomegally and patient without signs of volume overload on exam. Bedside ultrasound revealed significant pericardial effusion. Cardiology consulted who performed a bedside US as well, confirmed the effusion and recommended admission with surgical drainage today.  Discussed all results and patient verbalizes  understanding and agrees with plan.  This is a shared patient. This patient was discussed with the physician who saw and evaluated the patient and agrees with the plan.     Pura Spice, PA-C 06/11/14 [phone]

## 2014-06-12 LAB — CBC WITH DIFFERENTIAL/PLATELET
BASOS ABS: 0 10*3/uL (ref 0.0–0.1)
BASOS PCT: 0 % (ref 0–1)
Eosinophils Absolute: 0.2 10*3/uL (ref 0.0–0.7)
Eosinophils Relative: 2 % (ref 0–5)
HEMATOCRIT: 30.5 % — AB (ref 36.0–46.0)
Hemoglobin: 10.2 g/dL — ABNORMAL LOW (ref 12.0–15.0)
Lymphocytes Relative: 31 % (ref 12–46)
Lymphs Abs: 3 10*3/uL (ref 0.7–4.0)
MCH: 29.5 pg (ref 26.0–34.0)
MCHC: 33.4 g/dL (ref 30.0–36.0)
MCV: 88.2 fL (ref 78.0–100.0)
Monocytes Absolute: 1.2 10*3/uL — ABNORMAL HIGH (ref 0.1–1.0)
Monocytes Relative: 13 % — ABNORMAL HIGH (ref 3–12)
NEUTROS ABS: 5 10*3/uL (ref 1.7–7.7)
Neutrophils Relative %: 54 % (ref 43–77)
Platelets: 292 10*3/uL (ref 150–400)
RBC: 3.46 MIL/uL — ABNORMAL LOW (ref 3.87–5.11)
RDW: 12.7 % (ref 11.5–15.5)
WBC: 9.4 10*3/uL (ref 4.0–10.5)

## 2014-06-12 LAB — PH, BODY FLUID: pH, Fluid: 8

## 2014-06-12 LAB — BASIC METABOLIC PANEL
ANION GAP: 11 (ref 5–15)
BUN: 16 mg/dL (ref 6–23)
CALCIUM: 8.5 mg/dL (ref 8.4–10.5)
CHLORIDE: 102 meq/L (ref 96–112)
CO2: 24 meq/L (ref 19–32)
CREATININE: 0.86 mg/dL (ref 0.50–1.10)
GFR calc non Af Amer: 77 mL/min — ABNORMAL LOW (ref >90)
GFR, EST AFRICAN AMERICAN: 89 mL/min — AB (ref >90)
Glucose, Bld: 106 mg/dL — ABNORMAL HIGH (ref 70–99)
Potassium: 4.1 mEq/L (ref 3.7–5.3)
Sodium: 137 mEq/L (ref 137–147)

## 2014-06-12 MED ORDER — MODAFINIL 100 MG PO TABS
100.0000 mg | ORAL_TABLET | Freq: Every day | ORAL | Status: DC
  Filled 2014-06-12 (×4): qty 1

## 2014-06-12 MED ORDER — ONDANSETRON HCL 4 MG/2ML IJ SOLN
4.0000 mg | Freq: Once | INTRAMUSCULAR | Status: AC
  Administered 2014-06-12: 4 mg via INTRAVENOUS
  Filled 2014-06-12: qty 2

## 2014-06-12 NOTE — Progress Notes (Signed)
  Echocardiogram 2D Echocardiogram limited has been performed.  [name] 06/12/2014, 9:22 AM

## 2014-06-12 NOTE — Progress Notes (Signed)
Utilization review complete. Sanaai Doane RN CCM Case Mgmt phone [phone] 

## 2014-06-12 NOTE — Progress Notes (Signed)
Pt received into room 2w07, pt placed on tele, pt states no pain, partner at bedside, will continue to monitor closely Rickard Rhymes, RN

## 2014-06-12 NOTE — Progress Notes (Addendum)
SUBJECTIVE: Pt seen and examined in AM. Pt with asymptomatic bp of 84/48 overnight that improved. Per nurse with 215 mL drainage since yesterday   She reports left sided chest soreness at site of her tube that is worse with deep breathing. She denies other complaints.   OBJECTIVE:   Vitals:   Filed Vitals:   06/12/14 0225 06/12/14 0300 06/12/14 0500 06/12/14 0700  BP:  84/48 108/66 90/63  Pulse: 81 78 82 77  Temp:  98.5 F (36.9 C)  97.6 F (36.4 C)  TempSrc:  Oral  Oral  Resp: 25 14 16 13   Height:      Weight:      SpO2: 93% 92% 96% 96%   I&O's:   Intake/Output Summary (Last 24 hours) at 06/12/14 0846 Last data filed at 06/12/14 0700  Gross per 24 hour  Intake    243 ml  Output    815 ml  Net   -572 ml   TELEMETRY: Reviewed telemetry pt in normal sinus rhythm:     PHYSICAL EXAM General: Well developed, well nourished, in no acute distress Head:   Normal cephalic and atramatic  Lungs:  Clear bilaterally to auscultation. Heart:   HRRR S1 S2  No JVD. No friction rub.   Abdomen: abdomen soft and non-tender Msk:  Back normal,  Normal strength and tone for age. Extremities:  Trace b/l LE edema.   Neuro: Alert and oriented. Psych:  Normal affect, responds appropriately   LABS: Basic Metabolic Panel:  Recent Labs  06/11/14 0957 06/11/14 1640 06/12/14 0233  NA 134*  --  137  K 4.3  --  4.1  CL 98  --  102  CO2 23  --  24  GLUCOSE 144*  --  106*  BUN 13  --  16  CREATININE 0.79 0.87 0.86  CALCIUM 8.9  --  8.5   Liver Function Tests:  Recent Labs  06/11/14 0957  AST 24  ALT 32  ALKPHOS 179*  BILITOT 0.4  PROT 7.2  ALBUMIN 3.2*    Recent Labs  06/11/14 0957  LIPASE 12   CBC:  Recent Labs  06/11/14 0957 06/11/14 1640 06/12/14 0233  WBC 12.5* 9.4 9.4  NEUTROABS 9.6*  --  5.0  HGB 11.1* 10.7* 10.2*  HCT 32.8* 31.9* 30.5*  MCV 87.5 87.6 88.2  PLT 330 278 292   Cardiac Enzymes: No results for input(s): CKTOTAL, CKMB, CKMBINDEX,  TROPONINI in the last 72 hours. BNP: Invalid input(s): POCBNP D-Dimer: No results for input(s): DDIMER in the last 72 hours. Hemoglobin A1C: No results for input(s): HGBA1C in the last 72 hours. Fasting Lipid Panel: No results for input(s): CHOL, HDL, LDLCALC, TRIG, CHOLHDL, LDLDIRECT in the last 72 hours. Thyroid Function Tests: No results for input(s): TSH, T4TOTAL, T3FREE, THYROIDAB in the last 72 hours.  Invalid input(s): FREET3 Anemia Panel: No results for input(s): VITAMINB12, FOLATE, FERRITIN, TIBC, IRON, RETICCTPCT in the last 72 hours. Coag Panel:   No results found for: INR, PROTIME  RADIOLOGY: Dg Chest 2 View  06/11/2014   CLINICAL DATA:  Epigastric pain  EXAM: CHEST  2 VIEW  COMPARISON:  None.  FINDINGS: Moderate cardiomegaly. Small bilateral pleural effusions and bibasilar atelectasis. Normal vascularity.  IMPRESSION: Cardiomegaly without edema.  Small bilateral pleural effusions and bibasilar atelectasis.   Electronically Signed   By: Maryclare Bean M.D.   On: 06/11/2014 11:17      ASSESSMENT: 53 yo woman with pmh of hypertension and  MS on tysabri who presented on 11/1 with pleuritic chest pain, dyspnea, hypotension, and presyncope found to have pericardial tamponade who is s/p pericardiocentesis POD #1).     PLAN:    Acute pericarditis complicated by pericardial tamponade s/p urgent pericardiocentesis on 11/1 -Plan for 2D-echo today, if effusion improved will remove chest tube drain (215 mL drainage since yesterday). Cytology pending, appears to be exudative in nature. Pt is immunosuppressed on Tysabri for MS. Continue ibuprofen 600 mg TID and continue for 2-4 weeks. Can transfer out of CCU today pending echo.       Juluis Mire, MD  PGY-II IMTS 06/12/2014  8:46 AM  I have examined the patient and reviewed assessment and plan and discussed with patient.  Agree with above as stated.  Minimal drainiage from the pericardial drain.  I personally reviewed the echo.   There is a small posterior effusion.  No sign of tamponade.  Normal LV/RV function.  Remove pericardial drain and will plan to transfer to floor later today.  Zailey Audia S.    Addendum: Pericardial catheter removed without complication,.  SOme serosanguinous drainage noted from the site.

## 2014-06-13 LAB — OTHER BODY FLUID CHEMISTRY

## 2014-06-13 LAB — AMYLASE, PLEURAL FLUID: Amylase, Pleural Fluid: 11 U/L

## 2014-06-13 MED ORDER — COLCHICINE 0.6 MG PO TABS
0.6000 mg | ORAL_TABLET | Freq: Two times a day (BID) | ORAL | Status: DC
  Administered 2014-06-13 – 2014-06-20 (×13): 0.6 mg via ORAL
  Filled 2014-06-13 (×17): qty 1

## 2014-06-13 NOTE — Progress Notes (Signed)
SUBJECTIVE:  No events overnight. Blood pressure has improved. No complaints of chest pain.  OBJECTIVE:   Vitals:   Filed Vitals:   06/12/14 1600 06/12/14 1832 06/13/14 0500 06/13/14 0752  BP: 119/75 105/63 112/59 107/65  Pulse:  98 92 84  Temp: 97.6 F (36.4 C) 98.2 F (36.8 C) 98.2 F (36.8 C)   TempSrc: Oral Oral Oral   Resp: 15 18 18    Height:  5\' 10"  (1.778 m)    Weight:  263 lb 14.3 oz (119.7 kg)    SpO2: 98% 100% 93%    I&O's:    Intake/Output Summary (Last 24 hours) at 06/13/14 1101 Last data filed at 06/13/14 0730  Gross per 24 hour  Intake   1200 ml  Output   1150 ml  Net     50 ml   TELEMETRY: Reviewed telemetry pt in normal sinus rhythm:  PHYSICAL EXAM General: Well developed, well nourished, in no acute distress Head:   Normal cephalic and atramatic  Lungs:  Clear bilaterally to auscultation. Heart:   HRRR S1 S2  No JVD. No friction rub.   Abdomen: abdomen soft and non-tender Msk:  Back normal,  Normal strength and tone for age. Extremities:  Trace b/l LE edema.   Neuro: Alert and oriented. Psych:  Normal affect, responds appropriately   LABS: Basic Metabolic Panel:  Recent Labs  06/11/14 0957 06/11/14 1640 06/12/14 0233  NA 134*  --  137  K 4.3  --  4.1  CL 98  --  102  CO2 23  --  24  GLUCOSE 144*  --  106*  BUN 13  --  16  CREATININE 0.79 0.87 0.86  CALCIUM 8.9  --  8.5   Liver Function Tests:  Recent Labs  06/11/14 0957  AST 24  ALT 32  ALKPHOS 179*  BILITOT 0.4  PROT 7.2  ALBUMIN 3.2*    Recent Labs  06/11/14 0957  LIPASE 12   CBC:  Recent Labs  06/11/14 0957 06/11/14 1640 06/12/14 0233  WBC 12.5* 9.4 9.4  NEUTROABS 9.6*  --  5.0  HGB 11.1* 10.7* 10.2*  HCT 32.8* 31.9* 30.5*  MCV 87.5 87.6 88.2  PLT 330 278 292   Cardiac Enzymes: No results for input(s): CKTOTAL, CKMB, CKMBINDEX, TROPONINI in the last 72 hours. BNP: Invalid input(s): POCBNP D-Dimer: No results for input(s): DDIMER in the last 72  hours. Hemoglobin A1C: No results for input(s): HGBA1C in the last 72 hours. Fasting Lipid Panel: No results for input(s): CHOL, HDL, LDLCALC, TRIG, CHOLHDL, LDLDIRECT in the last 72 hours. Thyroid Function Tests: No results for input(s): TSH, T4TOTAL, T3FREE, THYROIDAB in the last 72 hours.  Invalid input(s): FREET3 Anemia Panel: No results for input(s): VITAMINB12, FOLATE, FERRITIN, TIBC, IRON, RETICCTPCT in the last 72 hours. Coag Panel:   No results found for: INR, PROTIME  RADIOLOGY: Dg Chest 2 View  06/11/2014   CLINICAL DATA:  Epigastric pain  EXAM: CHEST  2 VIEW  COMPARISON:  None.  FINDINGS: Moderate cardiomegaly. Small bilateral pleural effusions and bibasilar atelectasis. Normal vascularity.  IMPRESSION: Cardiomegaly without edema.  Small bilateral pleural effusions and bibasilar atelectasis.   Electronically Signed   By: Maryclare Bean M.D.   On: 06/11/2014 11:17    ASSESSMENT: 53 yo woman with pmh of hypertension and  MS on tysabri who presented on 11/1 with pleuritic chest pain, dyspnea, hypotension, and presyncope found to have pericardial tamponade who is s/p pericardiocentesis POD #1).  PLAN:    Acute pericarditis complicated by pericardial tamponade s/p urgent pericardiocentesis on 11/1 - She is on ibuprofen 800 mg TID. Add colcrys 0.6 mg BID to reduce likelihood of recurrence. It appears that this is likely a viral or inflammatory based on fluid analysis with many WBC's. If remains chest pain free, could likely be discharged tomorrow. Will need follow-up limited echo in 1 week as outpatient to re-assess effusion.  Pixie Casino, MD, [facility] Attending [place], MD  06/13/2014  11:01 AM

## 2014-06-14 ENCOUNTER — Encounter (HOSPITAL_COMMUNITY): Payer: Self-pay | Admitting: Radiology

## 2014-06-14 ENCOUNTER — Inpatient Hospital Stay (HOSPITAL_COMMUNITY): Payer: 59

## 2014-06-14 DIAGNOSIS — G35 Multiple sclerosis: Secondary | ICD-10-CM

## 2014-06-14 DIAGNOSIS — I319 Disease of pericardium, unspecified: Secondary | ICD-10-CM

## 2014-06-14 DIAGNOSIS — I314 Cardiac tamponade: Secondary | ICD-10-CM

## 2014-06-14 DIAGNOSIS — I309 Acute pericarditis, unspecified: Principal | ICD-10-CM

## 2014-06-14 MED ORDER — FUROSEMIDE 20 MG PO TABS
20.0000 mg | ORAL_TABLET | Freq: Once | ORAL | Status: AC
Start: 2014-06-14 — End: 2014-06-14
  Administered 2014-06-14: 20 mg via ORAL
  Filled 2014-06-14: qty 1

## 2014-06-14 MED ORDER — GUAIFENESIN ER 600 MG PO TB12
1200.0000 mg | ORAL_TABLET | Freq: Two times a day (BID) | ORAL | Status: DC
  Administered 2014-06-14 – 2014-06-20 (×12): 1200 mg via ORAL
  Filled 2014-06-14 (×17): qty 2

## 2014-06-14 MED ORDER — POLYETHYLENE GLYCOL 3350 17 G PO PACK
17.0000 g | PACK | Freq: Every day | ORAL | Status: DC
  Administered 2014-06-14 – 2014-06-15 (×2): 17 g via ORAL
  Filled 2014-06-14 (×3): qty 1

## 2014-06-14 MED ORDER — IOHEXOL 300 MG/ML  SOLN: 80.0000 mL | Freq: Once | INTRAMUSCULAR | Status: AC | PRN

## 2014-06-14 NOTE — Progress Notes (Signed)
Pt c/o of SBO when lying down and feels like she is retaining fluid again. Otherwise denies needs. Agree with student assessment.

## 2014-06-14 NOTE — Progress Notes (Signed)
Subjective: Some mild chest discomfort, + prod cough mostly clear, at rest no SOB with exertion some SOB  Objective: Vital signs in last 24 hours: Temp:  [98.4 F (36.9 C)-98.8 F (37.1 C)] 98.5 F (36.9 C) (11/04 0436) Pulse Rate:  [86-91] 88 (11/04 0436) Resp:  [17-18] 17 (11/04 0436) BP: (115-129)/(64-72) 129/72 mmHg (11/04 0436) SpO2:  [97 %-99 %] 99 % (11/04 0436) Weight change:  Last BM Date: 06/11/14 Intake/Output from previous day: 11/03 0701 - 11/04 0700 In: 720 [P.O.:720] Out: 1150 [Urine:1150] Intake/Output this shift:    PE: General:Pleasant affect, NAD Skin:Warm and dry, brisk capillary refill HEENT:normocephalic, sclera clear, mucus membranes moist Neck:supple, no JVD  Heart:S1S2 RRR without murmur, gallup, rub or click Lungs:clear though diminished in the bases.  without rales, rhonchi, or wheezes RJJ:OACZ, non tender, + BS, do not palpate liver spleen or masses Ext:no lower ext edema, 2+ pedal pulses, 2+ radial pulses Neuro:alert and oriented, MAE, follows commands, + facial symmetry   Lab Results:  Recent Labs  06/11/14 1640 06/12/14 0233  WBC 9.4 9.4  HGB 10.7* 10.2*  HCT 31.9* 30.5*  PLT 278 292   BMET  Recent Labs  06/11/14 0957 06/11/14 1640 06/12/14 0233  NA 134*  --  137  K 4.3  --  4.1  CL 98  --  102  CO2 23  --  24  GLUCOSE 144*  --  106*  BUN 13  --  16  CREATININE 0.79 0.87 0.86  CALCIUM 8.9  --  8.5   No results for input(s): TROPONINI in the last 72 hours.  Invalid input(s): CK, MB  No results found for: CHOL, HDL, LDLCALC, LDLDIRECT, TRIG, CHOLHDL No results found for: HGBA1C   No results found for: TSH  Hepatic Function Panel  Recent Labs  06/11/14 0957  PROT 7.2  ALBUMIN 3.2*  AST 24  ALT 32  ALKPHOS 179*  BILITOT 0.4   No results for input(s): CHOL in the last 72 hours. No results for input(s): PROTIME in the last 72 hours.     Studies/Results: No results found.  Medications: I  have reviewed the patient's current medications. Scheduled Meds: . colchicine  0.6 mg Oral BID  . doxepin  50 mg Oral QHS  . escitalopram  10 mg Oral Daily  . heparin  5,000 Units Subcutaneous 3 times per day  . ibuprofen  600 mg Oral TID  . modafinil  100 mg Oral Daily  . prednisoLONE acetate  1 drop Both Eyes TID  . sodium chloride  3 mL Intravenous Q12H   Continuous Infusions:  PRN Meds:.acetaminophen **OR** acetaminophen, morphine injection, oxyCODONE  Assessment/Plan: 53 yo woman with pmh of hypertension and MS on tysabri who presented on 11/1 with pleuritic chest pain, dyspnea, hypotension, and presyncope found to have pericardial tamponade who is s/p pericardiocentesis POD #1).   Cytology pending, appears to be exudative in nature. Pt is immunosuppressed on Tysabri for MS. Continue ibuprofen 800 mg TID and continue for 2-4 weeks. Added colcrys 0.6 mg BID to reduce likelihood of recurrence  Follow up echo pending.  Principal Problem:   Cardiac tamponade- pericardiocentesis -1 L of bloody pericardial fluid removed.  Tube removed on the 2nd.  Awaiting follow up echo.  Active Problems:   Multiple sclerosis   Cardiac/pericardial tamponade   Pericardial effusion, acute   URI- add mucinex to meds.   LOS: 3 days   Time spent with pt. :15  minutes. [facility] R  Nurse Practitioner Certified Pager [phone] or after 5pm and on weekends call [phone] 06/14/2014, 7:53 AM

## 2014-06-15 DIAGNOSIS — J90 Pleural effusion, not elsewhere classified: Secondary | ICD-10-CM

## 2014-06-15 LAB — COMPREHENSIVE METABOLIC PANEL
ALT: 33 U/L (ref 0–35)
AST: 24 U/L (ref 0–37)
Albumin: 2.7 g/dL — ABNORMAL LOW (ref 3.5–5.2)
Alkaline Phosphatase: 170 U/L — ABNORMAL HIGH (ref 39–117)
Anion gap: 11 (ref 5–15)
BUN: 9 mg/dL (ref 6–23)
CO2: 28 mEq/L (ref 19–32)
Calcium: 9.1 mg/dL (ref 8.4–10.5)
Chloride: 99 mEq/L (ref 96–112)
Creatinine, Ser: 0.73 mg/dL (ref 0.50–1.10)
GFR calc Af Amer: >90 mL/min (ref >90)
GFR calc non Af Amer: >90 mL/min (ref >90)
Glucose, Bld: 110 mg/dL — ABNORMAL HIGH (ref 70–99)
Potassium: 4.4 mEq/L (ref 3.7–5.3)
Sodium: 138 mEq/L (ref 137–147)
Total Bilirubin: 0.2 mg/dL — ABNORMAL LOW (ref 0.3–1.2)
Total Protein: 7.2 g/dL (ref 6.0–8.3)

## 2014-06-15 LAB — TYPE AND SCREEN
ABO/RH(D): A POS
Antibody Screen: NEGATIVE

## 2014-06-15 LAB — PROTIME-INR
INR: 1.05 (ref 0.00–1.49)
INR: 1.08 (ref 0.00–1.49)
Prothrombin Time: 13.8 seconds (ref 11.6–15.2)
Prothrombin Time: 14.1 seconds (ref 11.6–15.2)

## 2014-06-15 LAB — BLOOD GAS, ARTERIAL
Acid-Base Excess: 1.9 mmol/L (ref 0.0–2.0)
Bicarbonate: 25.2 mEq/L — ABNORMAL HIGH (ref 20.0–24.0)
Drawn by: 305991
FIO2: 0.21 %
O2 Saturation: 95.6 %
Patient temperature: 98.6
TCO2: 26.2 mmol/L (ref 0–100)
pCO2 arterial: 33.9 mmHg — ABNORMAL LOW (ref 35.0–45.0)
pH, Arterial: 7.483 — ABNORMAL HIGH (ref 7.350–7.450)
pO2, Arterial: 74.8 mmHg — ABNORMAL LOW (ref 80.0–100.0)

## 2014-06-15 LAB — CBC
HCT: 30.3 % — ABNORMAL LOW (ref 36.0–46.0)
Hemoglobin: 10.2 g/dL — ABNORMAL LOW (ref 12.0–15.0)
MCH: 29.1 pg (ref 26.0–34.0)
MCHC: 33.7 g/dL (ref 30.0–36.0)
MCV: 86.6 fL (ref 78.0–100.0)
Platelets: 314 10*3/uL (ref 150–400)
RBC: 3.5 MIL/uL — ABNORMAL LOW (ref 3.87–5.11)
RDW: 12.5 % (ref 11.5–15.5)
WBC: 7.6 10*3/uL (ref 4.0–10.5)

## 2014-06-15 LAB — SURGICAL PCR SCREEN
MRSA, PCR: NEGATIVE
Staphylococcus aureus: NEGATIVE

## 2014-06-15 LAB — URINALYSIS, ROUTINE W REFLEX MICROSCOPIC
Bilirubin Urine: NEGATIVE
Glucose, UA: NEGATIVE mg/dL
Hgb urine dipstick: NEGATIVE
Ketones, ur: NEGATIVE mg/dL
Leukocytes, UA: NEGATIVE
Nitrite: NEGATIVE
Protein, ur: NEGATIVE mg/dL
Specific Gravity, Urine: 1.014 (ref 1.005–1.030)
Urobilinogen, UA: 1 mg/dL (ref 0.0–1.0)
pH: 7 (ref 5.0–8.0)

## 2014-06-15 LAB — BODY FLUID CULTURE: Culture: NO GROWTH

## 2014-06-15 LAB — APTT
aPTT: 37 seconds (ref 24–37)
aPTT: 36 seconds (ref 24–37)

## 2014-06-15 MED ORDER — VANCOMYCIN HCL 10 G IV SOLR
1500.0000 mg | INTRAVENOUS | Status: DC
  Filled 2014-06-15: qty 1500

## 2014-06-15 NOTE — Progress Notes (Signed)
4 Days Post-Op Procedure(s) (LRB): PERICARDIAL TAP (N/A) Subjective: Patient examined, echocardiograms and chest CT scan reviewed. Middle-aged female with chronic immunosuppression for multiple sclerosis therapy presents with probable viral pericarditis and large pericardial effusion which was drained 1.0 L by pericardiocentesis 4 days ago.follow-up echo shows some evidence of recurrence and I agree that subxiphoid pericardial window is an appropriate procedure to prevent recurrent effusion and potentially future constriction. I discussed the procedure subxiphoid pericardial 1 of the patient and she understands indications benefits alternatives and risks. We'll proceed with pericardial window tomorrow.  Objective: Vital signs in last 24 hours: Temp:  [97.7 F (36.5 C)-98.3 F (36.8 C)] 97.7 F (36.5 C) (11/05 0900) Pulse Rate:  [61-83] 72 (11/05 0900) Cardiac Rhythm:  [-] Normal sinus rhythm (11/05 0900) Resp:  [18] 18 (11/05 0900) BP: (111-127)/(54-76) 127/76 mmHg (11/05 0900) SpO2:  [95 %-100 %] 100 % (11/05 0900)  Hemodynamic parameters for last 24 hours:  afebrile stable sinus rhythm  Intake/Output from previous day: 11/04 0701 - 11/05 0700 In: 360 [P.O.:360] Out: -  Intake/Output this shift:    Mildly obese middle-aged female Breath sounds clear No pericardial friction rub, sinus rhythm Abdomen nontender Extremities warm pulses intact  Lab Results: No results for input(s): WBC, HGB, HCT, PLT in the last 72 hours. BMET: No results for input(s): NA, K, CL, CO2, GLUCOSE, BUN, CREATININE, CALCIUM in the last 72 hours.  PT/INR:  Recent Labs  06/15/14 0403  LABPROT 13.8  INR 1.05   ABG No results found for: PHART, HCO3, TCO2, ACIDBASEDEF, O2SAT CBG (last 3)  No results for input(s): GLUCAP in the last 72 hours.  Assessment/Plan: S/P Procedure(s) (LRB): PERICARDIAL TAP (N/A) Plan subxiphoid pericardial window in a.m. tomorrow   LOS: 4 days    VAN TRIGT  III,PETER 06/15/2014

## 2014-06-15 NOTE — Care Management Note (Unsigned)
    Page 1 of 1   06/15/2014     3:53:33 PM CARE MANAGEMENT NOTE 06/15/2014  Patient:  [name], [name]   Account Number:  [id number]  Date Initiated:  06/12/2014  Documentation initiated by:  [facility]  Subjective/Objective Assessment:   Acute pericarditis     Action/Plan:   Anticipated DC Date:  06/14/2014   Anticipated DC Plan:  [place]  CM consult      Choice offered to / List presented to:             Status of service:  In process, will continue to follow Medicare Important Message given?   (If response is "NO", the following Medicare IM given date fields will be blank) Date Medicare IM given:   Medicare IM given by:   Date Additional Medicare IM given:   Additional Medicare IM given by:    Discharge Disposition:    Per UR Regulation:  Reviewed for med. necessity/level of care/duration of stay  If discussed at [place] of Stay Meetings, dates discussed:    Comments:  06/15/14 Ellan Lambert, RN, BSN [phone] Planning pericardial window on 06/16/14.  Will cont to follow progress.

## 2014-06-15 NOTE — Progress Notes (Signed)
Patient Profile: 53 y/o female who presented 06/11/14 with extreme fatigue, dyspnea and near syncope, in the setting of acute pericarditis complicated by pericardial tamponade s/p urgent pericardiocentesis on 11/1.   Subjective: Still with mild dyspnea and pleuritic CP.  No fatigue, syncope or near syncope.    Objective: Vital signs in last 24 hours: Temp:  [97.8 F (36.6 C)-98.3 F (36.8 C)] 97.8 F (36.6 C) (11/05 0432) Pulse Rate:  [61-83] 61 (11/05 0432) Resp:  [18] 18 (11/05 0432) BP: (111-123)/(54-75) 111/54 mmHg (11/05 0432) SpO2:  [95 %-100 %] 95 % (11/05 0432) Last BM Date: 06/11/14  Intake/Output from previous day: 2023/07/15 0701 - 11/05 0700 In: 360 [P.O.:360] Out: -  Intake/Output this shift:    Medications Current Facility-Administered Medications  Medication Dose Route Frequency Provider Last Rate Last Dose  . acetaminophen (TYLENOL) tablet 650 mg  650 mg Oral Q6H PRN Sherren Mocha, MD   650 mg at 06/12/14 0316   Or  . acetaminophen (TYLENOL) suppository 650 mg  650 mg Rectal Q6H PRN Sherren Mocha, MD      . colchicine tablet 0.6 mg  0.6 mg Oral BID Pixie Casino, MD   0.6 mg at 2014-07-14 2137  . doxepin (SINEQUAN) capsule 50 mg  50 mg Oral QHS Sherren Mocha, MD   50 mg at 2014-07-14 2137  . escitalopram (LEXAPRO) tablet 10 mg  10 mg Oral Daily Sherren Mocha, MD   10 mg at 07/14/2014 1110  . guaiFENesin (MUCINEX) 12 hr tablet 1,200 mg  1,200 mg Oral BID Isaiah Serge, NP   1,200 mg at 07-14-14 2136  . heparin injection 5,000 Units  5,000 Units Subcutaneous 3 times per day Sherren Mocha, MD   5,000 Units at 06/15/14 0545  . ibuprofen (ADVIL,MOTRIN) tablet 600 mg  600 mg Oral TID Sherren Mocha, MD   600 mg at Jul 14, 2014 2137  . modafinil (PROVIGIL) tablet 100 mg  100 mg Oral Daily Sherren Mocha, MD   100 mg at 06/12/14 2229  . morphine 2 MG/ML injection 2-4 mg  2-4 mg Intravenous Q4H PRN Sherren Mocha, MD   2 mg at 06/12/14 7989  . oxyCODONE (Oxy IR/ROXICODONE)  immediate release tablet 5 mg  5 mg Oral Q3H PRN Sherren Mocha, MD   5 mg at 06/13/14 0752  . polyethylene glycol (MIRALAX / GLYCOLAX) packet 17 g  17 g Oral Daily Pixie Casino, MD   17 g at 07-14-14 1434  . prednisoLONE acetate (PRED FORTE) 1 % ophthalmic suspension 1 drop  1 drop Both Eyes TID Sherren Mocha, MD   1 drop at July 14, 2014 2136  . sodium chloride 0.9 % injection 3 mL  3 mL Intravenous Q12H Sherren Mocha, MD   3 mL at 07-14-2014 2138    PE: General appearance: alert, cooperative and no distress Neck: no JVD Lungs: clear to auscultation bilaterally Heart: regular rate and rhythm Extremities: no LEE Pulses: 2+ and symmetric Skin: warm and dry Neurologic: Grossly normal  Lab Results:  No results for input(s): WBC, HGB, HCT, PLT in the last 72 hours. BMET No results for input(s): NA, K, CL, CO2, GLUCOSE, BUN, CREATININE, CALCIUM in the last 72 hours. PT/INR  Recent Labs  06/15/14 0403  LABPROT 13.8  INR 1.05    Studies/Results:  Repeat 2D echo 14-Jul-2014 Study Conclusions  - Left ventricle: The cavity size was normal. Systolic function was normal. The estimated ejection fraction was in the range of 55% to 60%. Septal bounce is present.  Wall motion was normal; there were no regional wall motion abnormalities. - Left atrium: The atrium was mildly dilated. - Pericardium, extracardiac: A mostly moderate pericardial effusion (1.1-1.5cm) was identified circumferential to the heart. Largest segment of effusion is 2.1-2.5 cm along right ventricular free wall. There is mild early diastolic invagination of the right ventricular basal free wall (short axis view) suggestive of elevated intrapericardial pressure. IVC however does change with respiration. Finding are suggestive of early tamponade physiology. Clinical correlation suggested.   Chest CT 06/14/14 FINDINGS: Mild cardiac enlargement with moderate size pericardial effusion. Prominent  mediastinal lymph nodes with pretracheal lymph nodes measuring up to 14 mm short axis dimension and left aortopulmonic window lymph nodes measuring up to about 7 mm short axis dimension. These are likely reactive. Small bilateral pleural effusions. Basilar atelectasis bilaterally. No significant pulmonary parenchymal consolidation. Airways are patent. No pneumothorax. Visualized portions of the upper abdominal organs are grossly unremarkable. Degenerative changes in the thoracic spine. No destructive bone lesions were.  IMPRESSION: Moderate-sized pericardial effusion. Small bilateral pleural effusions with basilar atelectasis. Probable reactive lymph nodes in the mediastinum.   Assessment/Plan  Principal Problem:   Cardiac tamponade Active Problems:   Multiple sclerosis   Cardiac/pericardial tamponade   Pericardial effusion, acute  1. Cardiac tamponade: s/p urgent pericardiocentesis on 11/1. Now with mild recurrent intermittent dyspnea. F/u 2D echo yesterday demonstrated re accumulation of pericardial fluid of moderate size. CT of chest also confirmed moderate size effusion. She remains hemodynamically stable. Plan for pericardial window by CT surgery. Continue NSAID and colchicine for pericarditis.    LOS: 4 days    Brittainy M. Rosita Fire, PA-C 06/15/2014 9:01 AM

## 2014-06-16 ENCOUNTER — Inpatient Hospital Stay (HOSPITAL_COMMUNITY): Payer: 59 | Admitting: Certified Registered Nurse Anesthetist

## 2014-06-16 ENCOUNTER — Inpatient Hospital Stay (HOSPITAL_COMMUNITY): Payer: 59

## 2014-06-16 ENCOUNTER — Encounter (HOSPITAL_COMMUNITY)
Admission: EM | Disposition: A | Discharge status: Home / Self Care | Payer: Self-pay | Source: Home / Self Care | Attending: Cardiovascular Disease

## 2014-06-16 ENCOUNTER — Encounter (HOSPITAL_COMMUNITY): Payer: Self-pay | Admitting: Certified Registered Nurse Anesthetist

## 2014-06-16 DIAGNOSIS — I313 Pericardial effusion (noninflammatory): Secondary | ICD-10-CM | POA: Diagnosis present

## 2014-06-16 DIAGNOSIS — I3139 Other pericardial effusion (noninflammatory): Secondary | ICD-10-CM | POA: Diagnosis present

## 2014-06-16 HISTORY — PX: TEE WITHOUT CARDIOVERSION: SHX5443

## 2014-06-16 HISTORY — PX: SUBXYPHOID PERICARDIAL WINDOW: SHX5075

## 2014-06-16 LAB — GLUCOSE, CAPILLARY
GLUCOSE-CAPILLARY: 98 mg/dL (ref 70–99)
Glucose-Capillary: 84 mg/dL (ref 70–99)

## 2014-06-16 LAB — POCT I-STAT 3, ART BLOOD GAS (G3+)
ACID-BASE DEFICIT: 3 mmol/L — AB (ref 0.0–2.0)
Bicarbonate: 23.1 mEq/L (ref 20.0–24.0)
O2 SAT: 92 %
Patient temperature: 97.6
TCO2: 24 mmol/L (ref 0–100)
pCO2 arterial: 43.4 mmHg (ref 35.0–45.0)
pH, Arterial: 7.332 — ABNORMAL LOW (ref 7.350–7.450)
pO2, Arterial: 66 mmHg — ABNORMAL LOW (ref 80.0–100.0)

## 2014-06-16 SURGERY — CREATION, PERICARDIAL WINDOW, SUBXIPHOID APPROACH
Anesthesia: General

## 2014-06-16 MED ORDER — SODIUM CHLORIDE 0.9 % IJ SOLN
10.0000 mL | INTRAMUSCULAR | Status: DC | PRN
  Administered 2014-06-19: 20 mL
  Filled 2014-06-16: qty 40

## 2014-06-16 MED ORDER — BISACODYL 5 MG PO TBEC
10.0000 mg | DELAYED_RELEASE_TABLET | Freq: Every day | ORAL | Status: DC
  Administered 2014-06-16 – 2014-06-20 (×5): 10 mg via ORAL
  Filled 2014-06-16 (×5): qty 2

## 2014-06-16 MED ORDER — PROPOFOL 10 MG/ML IV BOLUS
INTRAVENOUS | Status: AC
  Filled 2014-06-16: qty 20

## 2014-06-16 MED ORDER — DIPHENHYDRAMINE HCL 50 MG/ML IJ SOLN: 12.5000 mg | Freq: Four times a day (QID) | INTRAMUSCULAR | Status: DC | PRN

## 2014-06-16 MED ORDER — FENTANYL 10 MCG/ML IV SOLN
INTRAVENOUS | Status: DC
  Administered 2014-06-16: 16:00:00 via INTRAVENOUS
  Administered 2014-06-16: 180 ug via INTRAVENOUS
  Administered 2014-06-17: 17:00:00 via INTRAVENOUS
  Administered 2014-06-17: 315 ug via INTRAVENOUS
  Administered 2014-06-17: 135 ug via INTRAVENOUS
  Administered 2014-06-17: 240 ug via INTRAVENOUS
  Administered 2014-06-17: 165 ug via INTRAVENOUS
  Administered 2014-06-17: 06:00:00 via INTRAVENOUS
  Administered 2014-06-17: 15 ug via INTRAVENOUS
  Filled 2014-06-16 (×4): qty 50

## 2014-06-16 MED ORDER — PANTOPRAZOLE SODIUM 40 MG PO TBEC
40.0000 mg | DELAYED_RELEASE_TABLET | Freq: Every day | ORAL | Status: DC
  Administered 2014-06-16 – 2014-06-20 (×5): 40 mg via ORAL
  Filled 2014-06-16 (×5): qty 1

## 2014-06-16 MED ORDER — SODIUM CHLORIDE 0.9 % IJ SOLN: 9.0000 mL | INTRAMUSCULAR | Status: DC | PRN

## 2014-06-16 MED ORDER — FENTANYL CITRATE 0.05 MG/ML IJ SOLN
INTRAMUSCULAR | Status: DC | PRN
  Administered 2014-06-16: 100 ug via INTRAVENOUS
  Administered 2014-06-16 (×2): 50 ug via INTRAVENOUS
  Administered 2014-06-16: 150 ug via INTRAVENOUS
  Administered 2014-06-16: 50 ug via INTRAVENOUS

## 2014-06-16 MED ORDER — VANCOMYCIN HCL IN DEXTROSE 1-5 GM/200ML-% IV SOLN
1000.0000 mg | Freq: Two times a day (BID) | INTRAVENOUS | Status: AC
  Administered 2014-06-16: 1000 mg via INTRAVENOUS
  Filled 2014-06-16: qty 200

## 2014-06-16 MED ORDER — ROCURONIUM BROMIDE 100 MG/10ML IV SOLN
INTRAVENOUS | Status: DC | PRN
Start: 2014-06-16 — End: 2014-06-16
  Administered 2014-06-16: 50 mg via INTRAVENOUS

## 2014-06-16 MED ORDER — MIDAZOLAM HCL 2 MG/2ML IJ SOLN
INTRAMUSCULAR | Status: AC
  Filled 2014-06-16: qty 2

## 2014-06-16 MED ORDER — MIDAZOLAM HCL 5 MG/5ML IJ SOLN
INTRAMUSCULAR | Status: DC | PRN
  Administered 2014-06-16: 2 mg via INTRAVENOUS

## 2014-06-16 MED ORDER — ACETAMINOPHEN 500 MG PO TABS
1000.0000 mg | ORAL_TABLET | Freq: Four times a day (QID) | ORAL | Status: DC
  Administered 2014-06-16 – 2014-06-20 (×14): 1000 mg via ORAL
  Filled 2014-06-16 (×19): qty 2

## 2014-06-16 MED ORDER — OXYCODONE HCL 5 MG PO TABS
ORAL_TABLET | ORAL | Status: AC
  Filled 2014-06-16: qty 1

## 2014-06-16 MED ORDER — DEXTROSE-NACL 5-0.9 % IV SOLN
INTRAVENOUS | Status: DC
  Administered 2014-06-16: 125 mL/h via INTRAVENOUS
  Administered 2014-06-16 – 2014-06-17 (×2): via INTRAVENOUS

## 2014-06-16 MED ORDER — DIPHENHYDRAMINE HCL 12.5 MG/5ML PO ELIX
12.5000 mg | ORAL_SOLUTION | Freq: Four times a day (QID) | ORAL | Status: DC | PRN
  Filled 2014-06-16: qty 5

## 2014-06-16 MED ORDER — POTASSIUM CHLORIDE 10 MEQ/50ML IV SOLN: 10.0000 meq | Freq: Every day | INTRAVENOUS | Status: DC | PRN

## 2014-06-16 MED ORDER — MEPERIDINE HCL 25 MG/ML IJ SOLN: 6.2500 mg | INTRAMUSCULAR | Status: DC | PRN

## 2014-06-16 MED ORDER — HYDROMORPHONE HCL 1 MG/ML IJ SOLN
INTRAMUSCULAR | Status: AC
  Filled 2014-06-16: qty 1

## 2014-06-16 MED ORDER — NALOXONE HCL 0.4 MG/ML IJ SOLN: 0.4000 mg | INTRAMUSCULAR | Status: DC | PRN

## 2014-06-16 MED ORDER — METHYLPREDNISOLONE SODIUM SUCC 125 MG IJ SOLR: 60.0000 mg | INTRAMUSCULAR | Status: DC

## 2014-06-16 MED ORDER — OXYCODONE HCL 5 MG PO TABS
5.0000 mg | ORAL_TABLET | Freq: Once | ORAL | Status: AC | PRN
  Administered 2014-06-16: 5 mg via ORAL

## 2014-06-16 MED ORDER — SODIUM CHLORIDE 0.9 % IJ SOLN
10.0000 mL | Freq: Two times a day (BID) | INTRAMUSCULAR | Status: DC
  Administered 2014-06-17 – 2014-06-20 (×6): 10 mL

## 2014-06-16 MED ORDER — ONDANSETRON HCL 4 MG/2ML IJ SOLN: 4.0000 mg | Freq: Once | INTRAMUSCULAR | Status: DC | PRN

## 2014-06-16 MED ORDER — FENTANYL CITRATE 0.05 MG/ML IJ SOLN
INTRAMUSCULAR | Status: AC
  Filled 2014-06-16: qty 5

## 2014-06-16 MED ORDER — SENNOSIDES-DOCUSATE SODIUM 8.6-50 MG PO TABS
1.0000 | ORAL_TABLET | Freq: Every day | ORAL | Status: DC
  Administered 2014-06-16 – 2014-06-19 (×4): 1 via ORAL
  Filled 2014-06-16 (×5): qty 1

## 2014-06-16 MED ORDER — SODIUM CHLORIDE 0.9 % IJ SOLN
INTRAMUSCULAR | Status: AC
Start: 2014-06-16 — End: 2014-06-16
  Filled 2014-06-16: qty 10

## 2014-06-16 MED ORDER — VANCOMYCIN HCL 1000 MG IV SOLR
1000.0000 mg | INTRAVENOUS | Status: DC | PRN
  Administered 2014-06-16: 1500 mg via INTRAVENOUS

## 2014-06-16 MED ORDER — TRAMADOL HCL 50 MG PO TABS
50.0000 mg | ORAL_TABLET | Freq: Four times a day (QID) | ORAL | Status: DC | PRN
  Administered 2014-06-17: 50 mg via ORAL
  Administered 2014-06-17 – 2014-06-19 (×6): 100 mg via ORAL
  Filled 2014-06-16 (×4): qty 2
  Filled 2014-06-16: qty 1
  Filled 2014-06-16 (×2): qty 2

## 2014-06-16 MED ORDER — OXYCODONE HCL 5 MG/5ML PO SOLN: 5.0000 mg | Freq: Once | ORAL | Status: AC | PRN

## 2014-06-16 MED ORDER — EPHEDRINE SULFATE 50 MG/ML IJ SOLN
INTRAMUSCULAR | Status: AC
  Filled 2014-06-16: qty 1

## 2014-06-16 MED ORDER — CETYLPYRIDINIUM CHLORIDE 0.05 % MT LIQD
7.0000 mL | Freq: Two times a day (BID) | OROMUCOSAL | Status: DC
  Administered 2014-06-16 – 2014-06-20 (×8): 7 mL via OROMUCOSAL

## 2014-06-16 MED ORDER — METHYLPREDNISOLONE SODIUM SUCC 125 MG IJ SOLR
60.0000 mg | Freq: Two times a day (BID) | INTRAMUSCULAR | Status: AC
Start: 2014-06-16 — End: 2014-06-17
  Administered 2014-06-16 – 2014-06-17 (×3): 60 mg via INTRAVENOUS
  Filled 2014-06-16 (×3): qty 0.96

## 2014-06-16 MED ORDER — ROCURONIUM BROMIDE 50 MG/5ML IV SOLN
INTRAVENOUS | Status: AC
  Filled 2014-06-16: qty 1

## 2014-06-16 MED ORDER — LABETALOL HCL 5 MG/ML IV SOLN
INTRAVENOUS | Status: DC | PRN
  Administered 2014-06-16: 5 mg via INTRAVENOUS

## 2014-06-16 MED ORDER — PROPOFOL 10 MG/ML IV BOLUS
INTRAVENOUS | Status: DC | PRN
  Administered 2014-06-16: 50 mg via INTRAVENOUS
  Administered 2014-06-16: 150 mg via INTRAVENOUS

## 2014-06-16 MED ORDER — OXYCODONE HCL 5 MG PO TABS
5.0000 mg | ORAL_TABLET | ORAL | Status: DC | PRN
  Administered 2014-06-16 – 2014-06-19 (×6): 10 mg via ORAL
  Filled 2014-06-16 (×6): qty 2

## 2014-06-16 MED ORDER — ONDANSETRON HCL 4 MG/2ML IJ SOLN: 4.0000 mg | Freq: Four times a day (QID) | INTRAMUSCULAR | Status: DC | PRN

## 2014-06-16 MED ORDER — ONDANSETRON HCL 4 MG/2ML IJ SOLN
4.0000 mg | Freq: Four times a day (QID) | INTRAMUSCULAR | Status: DC | PRN
Start: 2014-06-16 — End: 2014-06-19
  Administered 2014-06-16 – 2014-06-17 (×3): 4 mg via INTRAVENOUS
  Filled 2014-06-16 (×4): qty 2

## 2014-06-16 MED ORDER — INSULIN ASPART 100 UNIT/ML ~~LOC~~ SOLN
0.0000 [IU] | Freq: Four times a day (QID) | SUBCUTANEOUS | Status: DC
Start: 2014-06-16 — End: 2014-06-20
  Administered 2014-06-16: 4 [IU] via SUBCUTANEOUS
  Administered 2014-06-17 – 2014-06-19 (×6): 2 [IU] via SUBCUTANEOUS

## 2014-06-16 MED ORDER — HYDROMORPHONE HCL 1 MG/ML IJ SOLN
0.2500 mg | INTRAMUSCULAR | Status: DC | PRN
  Administered 2014-06-16 (×4): 0.5 mg via INTRAVENOUS

## 2014-06-16 MED ORDER — ONDANSETRON HCL 4 MG/2ML IJ SOLN
INTRAMUSCULAR | Status: AC
  Filled 2014-06-16: qty 2

## 2014-06-16 MED ORDER — NEOSTIGMINE METHYLSULFATE 10 MG/10ML IV SOLN
INTRAVENOUS | Status: DC | PRN
  Administered 2014-06-16: 5 mg via INTRAVENOUS

## 2014-06-16 MED ORDER — ACETAMINOPHEN 160 MG/5ML PO SOLN: 1000.0000 mg | Freq: Four times a day (QID) | ORAL | Status: DC

## 2014-06-16 MED ORDER — LACTATED RINGERS IV SOLN
INTRAVENOUS | Status: DC | PRN
  Administered 2014-06-16: 13:00:00 via INTRAVENOUS

## 2014-06-16 MED ORDER — GLYCOPYRROLATE 0.2 MG/ML IJ SOLN
INTRAMUSCULAR | Status: DC | PRN
  Administered 2014-06-16: .8 mg via INTRAVENOUS

## 2014-06-16 MED ORDER — 0.9 % SODIUM CHLORIDE (POUR BTL) OPTIME
TOPICAL | Status: DC | PRN
  Administered 2014-06-16: 2000 mL

## 2014-06-16 MED ORDER — SODIUM CHLORIDE 0.9 % IV SOLN: INTRAVENOUS | Status: DC

## 2014-06-16 MED ORDER — SODIUM CHLORIDE 0.9 % IV SOLN: INTRAVENOUS | Status: DC | PRN

## 2014-06-16 SURGICAL SUPPLY — 56 items
ATTRACTOMAT 16X20 MAGNETIC DRP (DRAPES) ×3 IMPLANT
BENZOIN TINCTURE PRP APPL 2/3 (GAUZE/BANDAGES/DRESSINGS) ×3 IMPLANT
CANISTER SUCTION 2500CC (MISCELLANEOUS) ×3 IMPLANT
CATH ROBINSON RED A/P 18FR (CATHETERS) ×3 IMPLANT
CATH THORACIC 28FR (CATHETERS) IMPLANT
CATH THORACIC 28FR RT ANG (CATHETERS) ×3 IMPLANT
CATH THORACIC 36FR (CATHETERS) IMPLANT
CATH THORACIC 36FR RT ANG (CATHETERS) IMPLANT
CLOSURE WOUND 1/2 X4 (GAUZE/BANDAGES/DRESSINGS) ×1
CONT SPEC 4OZ CLIKSEAL STRL BL (MISCELLANEOUS) ×12 IMPLANT
COVER SURGICAL LIGHT HANDLE (MISCELLANEOUS) ×6 IMPLANT
DERMABOND ADHESIVE PROPEN (GAUZE/BANDAGES/DRESSINGS) ×2
DERMABOND ADVANCED .7 DNX6 (GAUZE/BANDAGES/DRESSINGS) ×1 IMPLANT
DRAIN CHANNEL 28F RND 3/8 FF (WOUND CARE) ×3 IMPLANT
DRAPE LAPAROSCOPIC ABDOMINAL (DRAPES) ×3 IMPLANT
DRAPE PROXIMA HALF (DRAPES) ×3 IMPLANT
ELECT BLADE 4.0 EZ CLEAN MEGAD (MISCELLANEOUS) ×3
ELECT BLADE 6.5 EXT (BLADE) ×3 IMPLANT
ELECT REM PT RETURN 9FT ADLT (ELECTROSURGICAL) ×3
ELECTRODE BLDE 4.0 EZ CLN MEGD (MISCELLANEOUS) ×1 IMPLANT
ELECTRODE REM PT RTRN 9FT ADLT (ELECTROSURGICAL) ×1 IMPLANT
GAUZE SPONGE 4X4 12PLY STRL (GAUZE/BANDAGES/DRESSINGS) ×3 IMPLANT
GLOVE BIO SURGEON STRL SZ7.5 (GLOVE) ×6 IMPLANT
HEMOSTAT POWDER SURGIFOAM 1G (HEMOSTASIS) IMPLANT
KIT BASIN OR (CUSTOM PROCEDURE TRAY) ×3 IMPLANT
KIT ROOM TURNOVER OR (KITS) ×3 IMPLANT
KIT SUCTION CATH 14FR (SUCTIONS) ×3 IMPLANT
NS IRRIG 1000ML POUR BTL (IV SOLUTION) ×3 IMPLANT
PACK CHEST (CUSTOM PROCEDURE TRAY) ×3 IMPLANT
PAD ARMBOARD 7.5X6 YLW CONV (MISCELLANEOUS) ×6 IMPLANT
PAD ELECT DEFIB RADIOL ZOLL (MISCELLANEOUS) ×3 IMPLANT
SPONGE INTESTINAL PEANUT (DISPOSABLE) ×3 IMPLANT
SPONGE LAP 4X18 X RAY DECT (DISPOSABLE) ×3 IMPLANT
STRIP CLOSURE SKIN 1/2X4 (GAUZE/BANDAGES/DRESSINGS) ×2 IMPLANT
SUT BONE WAX W31G (SUTURE) ×3 IMPLANT
SUT SILK  1 MH (SUTURE) ×4
SUT SILK 1 MH (SUTURE) ×2 IMPLANT
SUT SILK 2 0 SH CR/8 (SUTURE) ×3 IMPLANT
SUT VIC AB 1 CTX 18 (SUTURE) ×6 IMPLANT
SUT VIC AB 1 CTX 36 (SUTURE) ×2
SUT VIC AB 1 CTX36XBRD ANBCTR (SUTURE) ×1 IMPLANT
SUT VIC AB 2-0 CTX 36 (SUTURE) ×3 IMPLANT
SUT VIC AB 3-0 X1 27 (SUTURE) ×9 IMPLANT
SWAB COLLECTION DEVICE MRSA (MISCELLANEOUS) IMPLANT
SYR 50ML SLIP (SYRINGE) IMPLANT
SYRINGE 10CC LL (SYRINGE) IMPLANT
SYSTEM SAHARA CHEST DRAIN ATS (WOUND CARE) ×3 IMPLANT
TAPE CLOTH SURG 4X10 WHT LF (GAUZE/BANDAGES/DRESSINGS) ×3 IMPLANT
TOWEL OR 17X24 6PK STRL BLUE (TOWEL DISPOSABLE) ×3 IMPLANT
TOWEL OR 17X26 10 PK STRL BLUE (TOWEL DISPOSABLE) ×9 IMPLANT
TRAP SPECIMEN MUCOUS 40CC (MISCELLANEOUS) ×3 IMPLANT
TRAY FOLEY CATH 14FRSI W/METER (CATHETERS) ×3 IMPLANT
TRAY FOLEY CATH 16FRSI W/METER (SET/KITS/TRAYS/PACK) ×3 IMPLANT
TRAY FOLEY IC TEMP SENS 14FR (CATHETERS) ×3 IMPLANT
TUBE ANAEROBIC SPECIMEN COL (MISCELLANEOUS) IMPLANT
WATER STERILE IRR 1000ML POUR (IV SOLUTION) ×6 IMPLANT

## 2014-06-16 NOTE — Anesthesia Preprocedure Evaluation (Addendum)
Anesthesia Evaluation  Patient identified by MRN, date of birth, ID band Patient awake    Reviewed: Allergy & Precautions, H&P , NPO status , Patient's Chart, lab work & pertinent test results  Airway Mallampati: I  TM Distance: >3 FB Neck ROM: Full    Dental  (+) Teeth Intact, Dental Advisory Given   Pulmonary          Cardiovascular hypertension, Pt. on medications  Pericardial effusion prob secondary to viral pericarditis.   Neuro/Psych Depression H/O MS    GI/Hepatic   Endo/Other    Renal/GU      Musculoskeletal   Abdominal   Peds  Hematology   Anesthesia Other Findings   Reproductive/Obstetrics                           Anesthesia Physical Anesthesia Plan  ASA: III  Anesthesia Plan: General   Post-op Pain Management:    Induction: Intravenous  Airway Management Planned: Oral ETT  Additional Equipment: Arterial line, CVP, TEE and Ultrasound Guidance Line Placement  Intra-op Plan:   Post-operative Plan: Extubation in OR  Informed Consent: I have reviewed the patients History and Physical, chart, labs and discussed the procedure including the risks, benefits and alternatives for the proposed anesthesia with the patient or authorized representative who has indicated his/her understanding and acceptance.     Plan Discussed with: CRNA and Surgeon  Anesthesia Plan Comments:         Anesthesia Quick Evaluation

## 2014-06-16 NOTE — Anesthesia Procedure Notes (Addendum)
Procedure Name: Intubation Date/Time: 06/16/2014 1:12 PM Performed by: Julian Reil Pre-anesthesia Checklist: Patient identified, Emergency Drugs available, Suction available, Patient being monitored and Timeout performed Patient Re-evaluated:Patient Re-evaluated prior to inductionOxygen Delivery Method: Circle system utilized Preoxygenation: Pre-oxygenation with 100% oxygen Intubation Type: IV induction Ventilation: Mask ventilation without difficulty Laryngoscope Size: Mac and 3 Grade View: Grade IV Tube type: Oral Tube size: 8.0 mm Number of attempts: 1 Airway Equipment and Method: Stylet Placement Confirmation: ETT inserted through vocal cords under direct vision,  positive ETCO2 and breath sounds checked- equal and bilateral Secured at: 23 cm Tube secured with: Tape Dental Injury: Teeth and Oropharynx as per pre-operative assessment

## 2014-06-16 NOTE — Progress Notes (Signed)
Subjective: No complaints  Objective: Vital signs in last 24 hours: Temp:  [97.6 F (36.4 C)-98.2 F (36.8 C)] 97.6 F (36.4 C) (11/06 0507) Pulse Rate:  [70-76] 76 (11/06 0507) Resp:  [18] 18 (11/06 0507) BP: (120-131)/(75-94) 131/75 mmHg (11/06 0507) SpO2:  [98 %-100 %] 98 % (11/06 0507) Weight change:  Last BM Date: 06/15/14 Intake/Output from previous day:   Intake/Output this shift:    PE: General:Pleasant affect, NAD Skin:Warm and dry, brisk capillary refill HEENT:normocephalic, sclera clear, mucus membranes moist Neck:supple, no JVD Heart:S1S2 RRR without murmur, gallup, rub or click Lungs:clear without rales, rhonchi, or wheezes YKZ:LDJT, non tender, + BS, do not palpate liver spleen or masses Ext:no lower ext edema, 2+ pedal pulses, 2+ radial pulses Neuro:alert and oriented, MAE, follows commands, + facial symmetry   Lab Results:  Recent Labs  06/15/14 1710  WBC 7.6  HGB 10.2*  HCT 30.3*  PLT 314   BMET  Recent Labs  06/15/14 1710  NA 138  K 4.4  CL 99  CO2 28  GLUCOSE 110*  BUN 9  CREATININE 0.73  CALCIUM 9.1    Hepatic Function Panel  Recent Labs  06/15/14 1710  PROT 7.2  ALBUMIN 2.7*  AST 24  ALT 33  ALKPHOS 170*  BILITOT 0.2*   No results for input(s): CHOL in the last 72 hours. No results for input(s): PROTIME in the last 72 hours.     Studies/Results: Ct Chest W Contrast  06/14/2014   CLINICAL DATA:  Shortness of breath when lying down and feels like retaining fluid again.  EXAM: CT CHEST WITH CONTRAST  TECHNIQUE: Multidetector CT imaging of the chest was performed during intravenous contrast administration.  CONTRAST:  80 mL Omnipaque 300  COMPARISON:  Chest radiograph 06/11/2014  FINDINGS: Mild cardiac enlargement with moderate size pericardial effusion. Prominent mediastinal lymph nodes with pretracheal lymph nodes measuring up to 14 mm short axis dimension and left aortopulmonic window lymph nodes measuring  up to about 7 mm short axis dimension. These are likely reactive. Small bilateral pleural effusions. Basilar atelectasis bilaterally. No significant pulmonary parenchymal consolidation. Airways are patent. No pneumothorax. Visualized portions of the upper abdominal organs are grossly unremarkable. Degenerative changes in the thoracic spine. No destructive bone lesions were.  IMPRESSION: Moderate-sized pericardial effusion. Small bilateral pleural effusions with basilar atelectasis. Probable reactive lymph nodes in the mediastinum.   Electronically Signed   By: Lucienne Capers M.D.   On: 06/14/2014 22:22    Medications: I have reviewed the patient's current medications. Scheduled Meds: . colchicine  0.6 mg Oral BID  . doxepin  50 mg Oral QHS  . escitalopram  10 mg Oral Daily  . guaiFENesin  1,200 mg Oral BID  . heparin  5,000 Units Subcutaneous 3 times per day  . ibuprofen  600 mg Oral TID  . modafinil  100 mg Oral Daily  . polyethylene glycol  17 g Oral Daily  . prednisoLONE acetate  1 drop Both Eyes TID  . sodium chloride  3 mL Intravenous Q12H  . vancomycin  1,500 mg Intravenous On Call to OR   Continuous Infusions:  PRN Meds:.acetaminophen **OR** acetaminophen, morphine injection, oxyCODONE  Assessment/Plan: Principal Problem:  Cardiac tamponade Active Problems:  Multiple sclerosis  Cardiac/pericardial tamponade  Pericardial effusion, acute  1. Cardiac tamponade: s/p urgent pericardiocentesis on 11/1. Now with mild recurrent intermittent dyspnea. F/u 2D echo yesterday demonstrated re accumulation of pericardial fluid of moderate size.  CT of chest also confirmed moderate size effusion. She remains hemodynamically stable. Plan for pericardial window by CT surgery today. Continue NSAID and colchicine for pericarditis.    LOS: 5 days   Time spent with pt. : 15 minutes. [facility] R  Nurse Practitioner Certified Pager [phone] or after 5pm and on weekends call  [phone] 06/16/2014, 9:05 AM

## 2014-06-16 NOTE — Progress Notes (Signed)
Patient ID: [name], female   DOB: 05-16-1961, 53 y.o.   MRN: [id number] EVENING ROUNDS NOTE :     [address]       [place] [address]             [phone]                 Day of Surgery Procedure(s) (LRB): SUBXYPHOID PERICARDIAL WINDOW (N/A) TRANSESOPHAGEAL ECHOCARDIOGRAM (TEE) (N/A)  Total Length of Stay:  LOS: 5 days  BP 124/58 mmHg  Pulse 74  Temp(Src) 98.2 F (36.8 C) (Oral)  Resp 16  Ht 5\' 10"  (1.778 m)  Wt 263 lb 14.3 oz (119.7 kg)  BMI 37.86 kg/m2  SpO2 96%  .Intake/Output      11/06 0701 - 11/07 0700   P.O. 300   I.V. (mL/kg) 1356.3 (11.3)   Total Intake(mL/kg) 1656.3 (13.8)   Urine (mL/kg/hr) 360 (0.3)   Chest Tube 40 (0)   Total Output 400   Net +1256.3         . dextrose 5 % and 0.9% NaCl 125 mL/hr at 06/16/14 1657     Lab Results  Component Value Date   WBC 7.6 06/15/2014   HGB 10.2* 06/15/2014   HCT 30.3* 06/15/2014   PLT 314 06/15/2014   GLUCOSE 110* 06/15/2014   ALT 33 06/15/2014   AST 24 06/15/2014   NA 138 06/15/2014   K 4.4 06/15/2014   CL 99 06/15/2014   CREATININE 0.73 06/15/2014   BUN 9 06/15/2014   CO2 28 06/15/2014   INR 1.08 06/15/2014   Stable post op, minimal output  Grace Isaac MD  Beeper [phone] Office [phone] 06/16/2014 7:01 PM

## 2014-06-16 NOTE — Anesthesia Postprocedure Evaluation (Signed)
Anesthesia Post Note  Patient: [name]  Procedure(s) Performed: Procedure(s) (LRB): SUBXYPHOID PERICARDIAL WINDOW (N/A) TRANSESOPHAGEAL ECHOCARDIOGRAM (TEE) (N/A)  Anesthesia type: General  Patient location: PACU  Post pain: Pain level controlled and Adequate analgesia  Post assessment: Post-op Vital signs reviewed, Patient's Cardiovascular Status Stable, Respiratory Function Stable, Patent Airway and Pain level controlled  Last Vitals:  Filed Vitals:   06/16/14 1615  BP: 125/75  Pulse: 77  Temp:   Resp: 15    Post vital signs: Reviewed and stable  Level of consciousness: awake, alert  and oriented  Complications: No apparent anesthesia complications

## 2014-06-16 NOTE — Transfer of Care (Signed)
Immediate Anesthesia Transfer of Care Note  Patient: [name]  Procedure(s) Performed: Procedure(s): SUBXYPHOID PERICARDIAL WINDOW (N/A) TRANSESOPHAGEAL ECHOCARDIOGRAM (TEE) (N/A)  Patient Location: PACU  Anesthesia Type:General  Level of Consciousness: awake, alert , oriented and patient cooperative  Airway & Oxygen Therapy: Patient Spontanous Breathing and Patient connected to nasal cannula oxygen  Post-op Assessment: Report given to PACU RN, Post -op Vital signs reviewed and stable and Patient moving all extremities  Post vital signs: Reviewed and stable  Complications: No apparent anesthesia complications

## 2014-06-16 NOTE — Brief Op Note (Addendum)
06/11/2014 - 06/16/2014  2:28 PM  PATIENT:  [name]  53 y.o. female  PRE-OPERATIVE DIAGNOSIS:  1. PERICARDIAL EFFUSION 2. PERICARDITIS  POST-OPERATIVE DIAGNOSIS:  PERICARDIAL EFFUSION 2. PERICARDITIS  PROCEDURE:  TRANSESOPHAGEAL ECHOCARDIOGRAM (TEE), SUBXYPHOID PERICARDIAL WINDOW   FINDINGS: 100 cc of hemorraghic pericardial fluid removed, epicardium with erythema,edema and fibrinous exudate . pericardial space irrigated  And drained  SURGEON:  Surgeon(s) and Role:    * Ivin Poot, MD - Primary  PHYSICIAN ASSISTANT: Lars Pinks PA-C  ANESTHESIA:   general  EBL:  Total I/O In: 0  Out: 175 [Urine:175]  BLOOD ADMINISTERED:none  DRAINS: 28 right angle chest tube in the pericardial space   SPECIMEN:  Source of Specimen:  Pericardial fluid and pericardial biopsy  DISPOSITION OF SPECIMEN:  Pathology and cultures  COUNTS CORRECT:  YES  DICTATION: .Dragon Dictation  PLAN OF CARE: Admit to inpatient   PATIENT DISPOSITION:  PACU - hemodynamically stable.   Delay start of Pharmacological VTE agent (>24hrs) due to surgical blood loss or risk of bleeding: yes

## 2014-06-16 NOTE — Progress Notes (Signed)
The patient was examined and preop studies reviewed. There has been no change from the prior exam and the patient is ready for surgery.   Plan subxyphoid pericardial window on M [place] today

## 2014-06-17 DIAGNOSIS — I319 Disease of pericardium, unspecified: Secondary | ICD-10-CM

## 2014-06-17 LAB — URINALYSIS, ROUTINE W REFLEX MICROSCOPIC
Bilirubin Urine: NEGATIVE
Glucose, UA: NEGATIVE mg/dL
Ketones, ur: NEGATIVE mg/dL
Leukocytes, UA: NEGATIVE
Nitrite: NEGATIVE
Protein, ur: NEGATIVE mg/dL
Specific Gravity, Urine: 1.026 (ref 1.005–1.030)
Urobilinogen, UA: 0.2 mg/dL (ref 0.0–1.0)
pH: 6 (ref 5.0–8.0)

## 2014-06-17 LAB — POCT I-STAT 3, ART BLOOD GAS (G3+)
Bicarbonate: 25.8 mEq/L — ABNORMAL HIGH (ref 20.0–24.0)
O2 Saturation: 92 %
PH ART: 7.354 (ref 7.350–7.450)
PO2 ART: 68 mmHg — AB (ref 80.0–100.0)
Patient temperature: 37
TCO2: 27 mmol/L (ref 0–100)
pCO2 arterial: 46.2 mmHg — ABNORMAL HIGH (ref 35.0–45.0)

## 2014-06-17 LAB — BASIC METABOLIC PANEL
Anion gap: 11 (ref 5–15)
BUN: 7 mg/dL (ref 6–23)
CALCIUM: 8.6 mg/dL (ref 8.4–10.5)
CO2: 24 mEq/L (ref 19–32)
CREATININE: 0.59 mg/dL (ref 0.50–1.10)
Chloride: 100 mEq/L (ref 96–112)
GLUCOSE: 164 mg/dL — AB (ref 70–99)
Potassium: 4.2 mEq/L (ref 3.7–5.3)
Sodium: 135 mEq/L — ABNORMAL LOW (ref 137–147)

## 2014-06-17 LAB — GLUCOSE, CAPILLARY
GLUCOSE-CAPILLARY: 154 mg/dL — AB (ref 70–99)
GLUCOSE-CAPILLARY: 148 mg/dL — AB (ref 70–99)
GLUCOSE-CAPILLARY: 154 mg/dL — AB (ref 70–99)
Glucose-Capillary: 138 mg/dL — ABNORMAL HIGH (ref 70–99)

## 2014-06-17 LAB — CBC
HEMATOCRIT: 31.2 % — AB (ref 36.0–46.0)
HEMOGLOBIN: 10.4 g/dL — AB (ref 12.0–15.0)
MCH: 28.6 pg (ref 26.0–34.0)
MCHC: 33.3 g/dL (ref 30.0–36.0)
MCV: 85.7 fL (ref 78.0–100.0)
Platelets: 331 10*3/uL (ref 150–400)
RBC: 3.64 MIL/uL — ABNORMAL LOW (ref 3.87–5.11)
RDW: 12.3 % (ref 11.5–15.5)
WBC: 7.3 10*3/uL (ref 4.0–10.5)

## 2014-06-17 LAB — URINE MICROSCOPIC-ADD ON

## 2014-06-17 MED ORDER — SODIUM CHLORIDE 0.9 % IV SOLN: INTRAVENOUS | Status: DC | PRN

## 2014-06-17 MED ORDER — FENTANYL 10 MCG/ML IV SOLN
INTRAVENOUS | Status: DC
  Administered 2014-06-17 (×2): 135 ug via INTRAVENOUS
  Administered 2014-06-18: 120 ug via INTRAVENOUS
  Administered 2014-06-18: 60 ug via INTRAVENOUS
  Administered 2014-06-18: 105 ug via INTRAVENOUS
  Administered 2014-06-18: 30 ug via INTRAVENOUS
  Administered 2014-06-18: 08:00:00 via INTRAVENOUS
  Administered 2014-06-19: 15 ug via INTRAVENOUS
  Filled 2014-06-17 (×2): qty 50

## 2014-06-17 NOTE — Progress Notes (Signed)
Subjective: 7/10 chest pain, manageable, trying to wean off PCA.  Objective:  Vital signs in last 24 hours: Temp:  [97.4 F (36.3 C)-98.7 F (37.1 C)] 97.9 F (36.6 C) (11/07 0700) Pulse Rate:  [58-89] 58 (11/07 0800) Resp:  [11-24] 12 (11/07 0800) BP: (95-136)/(30-75) 105/57 mmHg (11/07 0800) SpO2:  [90 %-100 %] 94 % (11/07 0800) Arterial Line BP: (97-163)/(51-78) 111/56 mmHg (11/07 0800) Weight change:  Last BM Date: 06/15/14   Intake/Output from previous day: 11/06 0701 - 11/07 0700 In: 4154.3 [P.O.:1380; I.V.:2774.3] Out: 830 [Urine:745; Chest Tube:85] Intake/Output this shift: Total I/O In: 125 [I.V.:125] Out: 40 [Urine:40]  PE: General:Pleasant affect, NAD Skin:Warm and dry, brisk capillary refill HEENT:normocephalic, sclera clear, mucus membranes moist Neck:supple, no JVD Heart:S1S2 RRR without murmur, gallup, + rub or click Lungs: rales at bases, rhonchi, or wheezes IFO:YDXA, non tender, + BS, do not palpate liver spleen or masses Ext:no lower ext edema, 2+ pedal pulses, 2+ radial pulses Neuro:alert and oriented, MAE, follows commands, + facial symmetry   Lab Results:  Recent Labs  06/15/14 1710 06/17/14 0332  WBC 7.6 7.3  HGB 10.2* 10.4*  HCT 30.3* 31.2*  PLT 314 331   BMET  Recent Labs  06/15/14 1710 06/17/14 0332  NA 138 135*  K 4.4 4.2  CL 99 100  CO2 28 24  GLUCOSE 110* 164*  BUN 9 7  CREATININE 0.73 0.59  CALCIUM 9.1 8.6    Hepatic Function Panel  Recent Labs  06/15/14 1710  PROT 7.2  ALBUMIN 2.7*  AST 24  ALT 33  ALKPHOS 170*  BILITOT 0.2*   Studies/Results: Dg Chest Port 1 View  06/16/2014   CLINICAL DATA:  History pericardial effusion.  History pneumothorax.  EXAM: PORTABLE CHEST - 1 VIEW  COMPARISON:  CT 06/14/2014, chest x-ray 06/11/2014.  FINDINGS: Right IJ line in good anatomic position. Left chest tube is noted projected over the lower left chest. No pneumothorax. Cardiomegaly is noted. Should be noted prior CT  of the chest of 06/14/2014 demonstrated a pericardial effusion. Left pleural effusions present. Basilar atelectasis and/or infiltrate noted on the left. No pneumothorax. No acute osseous abnormality .  IMPRESSION: 1. Right IJ line in good anatomic position. Left chest tube noted over lower medial left chest. No pneumothorax. 2. Prominent cardiomegaly. Reference is made to prior chest CT of 06/14/2014 which demonstrates a pericardial effusion. 3. Left lower lobe atelectasis and/or infiltrate with small left effusion. Pleural effusion appears diminished from prior CT of 06/14/2014.   Electronically Signed   By: Marcello Moores  Register   On: 06/16/2014 15:21    Medications: I have reviewed the patient's current medications. Scheduled Meds: . acetaminophen  1,000 mg Oral 4 times per day   Or  . acetaminophen (TYLENOL) oral liquid 160 mg/5 mL  1,000 mg Oral 4 times per day  . antiseptic oral rinse  7 mL Mouth Rinse BID  . bisacodyl  10 mg Oral Daily  . colchicine  0.6 mg Oral BID  . doxepin  50 mg Oral QHS  . escitalopram  10 mg Oral Daily  . fentaNYL   Intravenous 6 times per day  . guaiFENesin  1,200 mg Oral BID  . ibuprofen  600 mg Oral TID  . insulin aspart  0-24 Units Subcutaneous 4 times per day  . methylPREDNISolone (SOLU-MEDROL) injection  60 mg Intravenous Q12H  . modafinil  100 mg Oral Daily  . pantoprazole  40 mg Oral Daily  . prednisoLONE acetate  1 drop Both Eyes TID  . senna-docusate  1 tablet Oral QHS  . sodium chloride  10-40 mL Intracatheter Q12H   Continuous Infusions: . dextrose 5 % and 0.9% NaCl 50 mL/hr at 06/17/14 1000   PRN Meds:.Place/Maintain arterial line **AND** sodium chloride, Place/Maintain arterial line **AND** sodium chloride, diphenhydrAMINE **OR** diphenhydrAMINE, naloxone **AND** sodium chloride, ondansetron (ZOFRAN) IV, oxyCODONE, potassium chloride, sodium chloride, traMADol  Assessment/Plan: Principal Problem:  Cardiac tamponade Active Problems:  Multiple  sclerosis  Cardiac/pericardial tamponade  Pericardial effusion, acute  1. Cardiac tamponade:  s/p urgent pericardiocentesis on 11/1, with recurrent effusion, s/p pericardial window on 11/6. Removed chest tube today. Continue colchicine and prednisone and NSAIDS.   2. Anemia - stable, expected post procedure, we will monitor  3. BP - stable    LOS: 6 days   Dorothy Spark 06/17/2014

## 2014-06-17 NOTE — Plan of Care (Signed)
Problem: Phase I Progression Outcomes Goal: Sutures/staples intact Outcome: Completed/Met Date Met:  06/17/14     

## 2014-06-17 NOTE — Progress Notes (Signed)
Patient ID: [name], female   DOB: [birth year], 53 y.o.   MRN: [id number] TCTS DAILY ICU PROGRESS NOTE                   [address]            [place] [address]          [phone]   1 Day Post-Op Procedure(s) (LRB): SUBXYPHOID PERICARDIAL WINDOW (N/A) TRANSESOPHAGEAL ECHOCARDIOGRAM (TEE) (N/A)  Total Length of Stay:  LOS: 6 days   Subjective: Awake and alert , fatigued  Objective: Vital signs in last 24 hours: Temp:  [97.4 F (36.3 C)-98.7 F (37.1 C)] 97.9 F (36.6 C) (11/07 0700) Pulse Rate:  [58-89] 58 (11/07 0800) Cardiac Rhythm:  [-] Normal sinus rhythm (11/06 2000) Resp:  [11-24] 12 (11/07 0800) BP: (95-136)/(30-77) 105/57 mmHg (11/07 0800) SpO2:  [90 %-100 %] 94 % (11/07 0800) Arterial Line BP: (97-163)/(51-78) 111/56 mmHg (11/07 0800)  Filed Weights   06/11/14 0927 06/12/14 1832  Weight: 250 lb (113.399 kg) 263 lb 14.3 oz (119.7 kg)    Weight change:    Hemodynamic parameters for last 24 hours:    Intake/Output from previous day: 11/06 0701 - 11/07 0700 In: 4154.3 [P.O.:1380; I.V.:2774.3] Out: 830 [Urine:745; Chest Tube:85]  Intake/Output this shift:    Current Meds: Scheduled Meds: . acetaminophen  1,000 mg Oral 4 times per day   Or  . acetaminophen (TYLENOL) oral liquid 160 mg/5 mL  1,000 mg Oral 4 times per day  . antiseptic oral rinse  7 mL Mouth Rinse BID  . bisacodyl  10 mg Oral Daily  . colchicine  0.6 mg Oral BID  . doxepin  50 mg Oral QHS  . escitalopram  10 mg Oral Daily  . fentaNYL   Intravenous 6 times per day  . guaiFENesin  1,200 mg Oral BID  . ibuprofen  600 mg Oral TID  . insulin aspart  0-24 Units Subcutaneous 4 times per day  . methylPREDNISolone (SOLU-MEDROL) injection  60 mg Intravenous Q12H  . modafinil  100 mg Oral Daily  . pantoprazole  40 mg Oral Daily  . prednisoLONE acetate  1 drop Both Eyes TID  . senna-docusate  1 tablet Oral QHS  . sodium chloride  10-40 mL Intracatheter Q12H   Continuous  Infusions: . dextrose 5 % and 0.9% NaCl 125 mL/hr (06/16/14 2312)   PRN Meds:.Place/Maintain arterial line **AND** sodium chloride, Place/Maintain arterial line **AND** sodium chloride, diphenhydrAMINE **OR** diphenhydrAMINE, naloxone **AND** sodium chloride, ondansetron (ZOFRAN) IV, oxyCODONE, potassium chloride, sodium chloride, traMADol  General appearance: alert, cooperative, appears older than stated age and no distress Neurologic: intact Heart: regular rate and rhythm, S1, S2 normal, no murmur, click, rub or gallop Lungs: diminished breath sounds bibasilar Abdomen: soft, non-tender; bowel sounds normal; no masses,  no organomegaly Extremities: extremities normal, atraumatic, no cyanosis or edema and Homans sign is negative, no sign of DVT Wound: tube intact with minimal drainage since yesterday brusise all over abdomen from heparin  Lab Results: CBC: Recent Labs  06/15/14 1710 06/17/14 0332  WBC 7.6 7.3  HGB 10.2* 10.4*  HCT 30.3* 31.2*  PLT 314 331   BMET:  Recent Labs  06/15/14 1710 06/17/14 0332  NA 138 135*  K 4.4 4.2  CL 99 100  CO2 28 24  GLUCOSE 110* 164*  BUN 9 7  CREATININE 0.73 0.59  CALCIUM 9.1 8.6    PT/INR:  Recent Labs  06/15/14 1710  LABPROT 14.1  INR 1.08   Radiology: Dg Chest Port 1 View  06/16/2014   CLINICAL DATA:  History pericardial effusion.  History pneumothorax.  EXAM: PORTABLE CHEST - 1 VIEW  COMPARISON:  CT 06/14/2014, chest x-ray 06/11/2014.  FINDINGS: Right IJ line in good anatomic position. Left chest tube is noted projected over the lower left chest. No pneumothorax. Cardiomegaly is noted. Should be noted prior CT of the chest of 06/14/2014 demonstrated a pericardial effusion. Left pleural effusions present. Basilar atelectasis and/or infiltrate noted on the left. No pneumothorax. No acute osseous abnormality .  IMPRESSION: 1. Right IJ line in good anatomic position. Left chest tube noted over lower medial left chest. No pneumothorax.  2. Prominent cardiomegaly. Reference is made to prior chest CT of 06/14/2014 which demonstrates a pericardial effusion. 3. Left lower lobe atelectasis and/or infiltrate with small left effusion. Pleural effusion appears diminished from prior CT of 06/14/2014.   Electronically Signed   By: Marcello Moores  Register   On: 06/16/2014 15:21     Assessment/Plan: S/P Procedure(s) (LRB): SUBXYPHOID PERICARDIAL WINDOW (N/A) TRANSESOPHAGEAL ECHOCARDIOGRAM (TEE) (N/A) Mobilize Diuresis d/c tubes/lines Stable from surgical view Further plans per cardiology Pas hose in place, avoid heparin now to avoid  Complication of pericardial hemmorage preop anemia and  Expected Acute  Blood - loss Anemia   [name] B 06/17/2014 9:03 AM

## 2014-06-17 NOTE — Plan of Care (Signed)
Problem: Phase I Progression Outcomes Goal: Pain controlled with appropriate interventions Outcome: Completed/Met Date Met:  06/17/14 Goal: OOB as tolerated unless otherwise ordered Outcome: Completed/Met Date Met:  06/17/14 Goal: Incision/dressings dry and intact Outcome: Completed/Met Date Met:  06/17/14 Goal: Voiding-avoid urinary catheter unless indicated Outcome: Completed/Met Date Met:  06/17/14 Foley D/C'd per order, Pt voided 200 post removal

## 2014-06-17 NOTE — Progress Notes (Signed)
Patient ID: [name], female   DOB: March 14, 1961, 53 y.o.   MRN: [id number] EVENING ROUNDS NOTE :     [address]       [place] [address]             [phone]                 1 Day Post-Op Procedure(s) (LRB): SUBXYPHOID PERICARDIAL WINDOW (N/A) TRANSESOPHAGEAL ECHOCARDIOGRAM (TEE) (N/A)  Total Length of Stay:  LOS: 6 days  BP 127/67 mmHg  Pulse 80  Temp(Src) 97.8 F (36.6 C) (Oral)  Resp 26  Ht 5\' 10"  (1.778 m)  Wt 263 lb 14.3 oz (119.7 kg)  BMI 37.86 kg/m2  SpO2 98%  .Intake/Output      11/06 0701 - 11/07 0700 11/07 0701 - 11/08 0700   P.O. 1380 240   I.V. (mL/kg) 2774.3 (23.2) 773 (6.5)   Total Intake(mL/kg) 4154.3 (34.7) 1013 (8.5)   Urine (mL/kg/hr) 745 (0.3) 240 (0.2)   Chest Tube 85 (0) 5 (0)   Total Output 830 245   Net +3324.3 +768          . dextrose 5 % and 0.9% NaCl 50 mL/hr at 06/17/14 1000     Lab Results  Component Value Date   WBC 7.3 06/17/2014   HGB 10.4* 06/17/2014   HCT 31.2* 06/17/2014   PLT 331 06/17/2014   GLUCOSE 164* 06/17/2014   ALT 33 06/15/2014   AST 24 06/15/2014   NA 135* 06/17/2014   K 4.2 06/17/2014   CL 100 06/17/2014   CREATININE 0.59 06/17/2014   BUN 7 06/17/2014   CO2 24 06/17/2014   INR 1.08 06/15/2014   Stable day, tubes out, ambulated [place] [phone] Office [phone] 06/17/2014 6:25 PM

## 2014-06-17 NOTE — Op Note (Signed)
[name], [name]  MEDICAL RECORD NO.:  [id number]  LOCATION:  2S05C                        FACILITY:  [facility]  PHYSICIAN:  Ivin Poot, M.D.  DATE OF BIRTH:  June 03, 1961  DATE OF PROCEDURE:  06/16/2014 DATE OF DISCHARGE:                              OPERATIVE REPORT   OPERATION:  Subxiphoid pericardial window.  PREOPERATIVE DIAGNOSIS:  Acute pericarditis with pericardial effusion, recurrent.  POSTOPERATIVE DIAGNOSIS:  Acute pericarditis with pericardial effusion, recurrent.  ANESTHESIA:  General.  SURGEON:  Ivin Poot, M.D.  ASSISTANT:  Lars Pinks, PA-C.  CLINICAL NOTE:  The patient is a 53 year old obese female on immunosuppressive therapy for multiple sclerosis.  She presented with acute pericarditis and large pericardial effusion in respiratory distress.  An emergency pericardiocentesis was performed in the cath lab by Cardiology, which drained 1 L of serosanguineous fluid.  The patient was started on anti-inflammatory agents and colchicine.  Followup echo 48-72 hours later demonstrates some recurrent of the effusion, it was felt that surgical pericardial window would be indicated prior to her discharge to prevent recurrent effusion.  I examined the patient, reviewed the echocardiograms as well as a chest CT scan.  I agreed that the patient had evidence of recurrent pericardial effusion and that pericardial window would be appropriate to prevent further recurrence and prevent rehospitalization.  I discussed the procedure of subxiphoid pericardial window with the patient including the details of general anesthesia, the location of the surgical incisions, the use of a postoperative pericardial drain, and the expected postoperative hospital recovery.  I discussed with the patient risks of bleeding, recurrent effusion, pleural effusion, pain, infection, recurrent pericardial effusion, arrhythmia, bleeding, and death.   She demonstrated her understanding and agreed to proceed with surgery under what I felt was an informed consent.  OPERATIVE PROCEDURE:  The patient was brought to the operating room, placed supine on the operating table where general anesthesia was induced under invasive hemodynamic monitoring.  Chest and upper abdomen were prepped and draped as a sterile field.  A proper time-out was performed.  A small incision was made based on the xiphoid.  This was carried down to the fascia.  The xiphoid was excised.  The sternal elevating retractor was placed.  The soft tissue underneath the sternum was dissected off to expose the anterior surface of the pericardium.  An incision was made in the pericardium and serosanguineous fluid drained immediately.  The pericardium itself was thickened and inflamed.  A 3-cm pericardial window was created.  The epicardium was inspected.  It was inflamed, edematous, and erythematous with some fibrinous exudate, which was irrigated extensively with over 500 mL of fluid.  100 mL to 200 mL of fluid was drained from the pericardial space prior to irrigation.  I also gently broke up pericardial adhesions with my gloved finger and this facilitated irrigation of the pericardial space.  An angled 24- French chest tube was then placed through a separate incision independently into the pericardium and secured to the skin.  The sternal elevating retractor was removed.  The fascia was closed with interrupted #1 Vicryl.  The subcutaneous and skin layers were closed in running Vicryl  and sterile dressings were applied.  The patient will be extubated and returned to the recovery room for further observation and care.     Ivin Poot, M.D.     PV/MEDQ  D:  06/16/2014  T:  06/17/2014  Job:  [id number]

## 2014-06-17 NOTE — Plan of Care (Signed)
Problem: Phase I Progression Outcomes Goal: Pain controlled with appropriate interventions Outcome: Progressing Goal: Tubes/drains patent Outcome: Completed/Met Date Met:  06/17/14 Pt with 1 M.T. To -20 cm Sx with minimal output of serosang. Drainage, no air leak noted. Goal: Vital signs/hemodynamically stable Outcome: Completed/Met Date Met:  06/17/14

## 2014-06-18 DIAGNOSIS — I369 Nonrheumatic tricuspid valve disorder, unspecified: Secondary | ICD-10-CM

## 2014-06-18 LAB — CBC
HCT: 32.3 % — ABNORMAL LOW (ref 36.0–46.0)
Hemoglobin: 10.8 g/dL — ABNORMAL LOW (ref 12.0–15.0)
MCH: 28.8 pg (ref 26.0–34.0)
MCHC: 33.4 g/dL (ref 30.0–36.0)
MCV: 86.1 fL (ref 78.0–100.0)
Platelets: 407 10*3/uL — ABNORMAL HIGH (ref 150–400)
RBC: 3.75 MIL/uL — ABNORMAL LOW (ref 3.87–5.11)
RDW: 12.4 % (ref 11.5–15.5)
WBC: 13.7 10*3/uL — ABNORMAL HIGH (ref 4.0–10.5)

## 2014-06-18 LAB — COMPREHENSIVE METABOLIC PANEL
ALBUMIN: 2.6 g/dL — AB (ref 3.5–5.2)
ALT: 32 U/L (ref 0–35)
ANION GAP: 12 (ref 5–15)
AST: 28 U/L (ref 0–37)
Alkaline Phosphatase: 174 U/L — ABNORMAL HIGH (ref 39–117)
BUN: 10 mg/dL (ref 6–23)
CHLORIDE: 101 meq/L (ref 96–112)
CO2: 24 mEq/L (ref 19–32)
CREATININE: 0.74 mg/dL (ref 0.50–1.10)
Calcium: 8.9 mg/dL (ref 8.4–10.5)
GFR calc Af Amer: >90 mL/min (ref >90)
Glucose, Bld: 143 mg/dL — ABNORMAL HIGH (ref 70–99)
Potassium: 4.1 mEq/L (ref 3.7–5.3)
Sodium: 137 mEq/L (ref 137–147)
Total Bilirubin: <0.2 mg/dL — ABNORMAL LOW (ref 0.3–1.2)
Total Protein: 7 g/dL (ref 6.0–8.3)

## 2014-06-18 LAB — GLUCOSE, CAPILLARY
GLUCOSE-CAPILLARY: 108 mg/dL — AB (ref 70–99)
Glucose-Capillary: 150 mg/dL — ABNORMAL HIGH (ref 70–99)
Glucose-Capillary: 97 mg/dL (ref 70–99)
Glucose-Capillary: 122 mg/dL — ABNORMAL HIGH (ref 70–99)

## 2014-06-18 NOTE — Progress Notes (Signed)
Subjective: 4/10 chest pain from 7/10 yesterday, manageable, trying to wean off PCA. Mild SOB.   Objective:  Vital signs in last 24 hours: Temp:  [97.6 F (36.4 C)-97.9 F (36.6 C)] 97.6 F (36.4 C) (11/08 0000) Pulse Rate:  [53-80] 58 (11/08 0700) Resp:  [11-26] 15 (11/08 0700) BP: (105-134)/(55-80) 125/72 mmHg (11/08 0700) SpO2:  [91 %-100 %] 100 % (11/08 0700) Arterial Line BP: (122-126)/(66-67) 126/66 mmHg (11/07 1000) Weight change:  Last BM Date: 06/15/14   Intake/Output from previous day: 11/07 0701 - 11/08 0700 In: 2490.5 [P.O.:1080; I.V.:1410.5] Out: 1445 [Urine:1440; Chest Tube:5] Intake/Output this shift:    PE: General:Pleasant affect, NAD Skin:Warm and dry, brisk capillary refill HEENT:normocephalic, sclera clear, mucus membranes moist Neck:supple, no JVD Heart:S1S2 RRR without murmur, gallup, + rub or click Lungs: rales at bases, rhonchi, or wheezes SWF:UXNA, non tender, + BS, do not palpate liver spleen or masses Ext:no lower ext edema, 2+ pedal pulses, 2+ radial pulses Neuro:alert and oriented, MAE, follows commands, + facial symmetry   Lab Results:  Recent Labs  06/17/14 0332 06/18/14 0300  WBC 7.3 13.7*  HGB 10.4* 10.8*  HCT 31.2* 32.3*  PLT 331 407*   BMET  Recent Labs  06/17/14 0332 06/18/14 0300  NA 135* 137  K 4.2 4.1  CL 100 101  CO2 24 24  GLUCOSE 164* 143*  BUN 7 10  CREATININE 0.59 0.74  CALCIUM 8.6 8.9    Hepatic Function Panel  Recent Labs  06/18/14 0300  PROT 7.0  ALBUMIN 2.6*  AST 28  ALT 32  ALKPHOS 174*  BILITOT <0.2*   Studies/Results: Dg Chest Port 1 View  06/16/2014   CLINICAL DATA:  History pericardial effusion.  History pneumothorax.  EXAM: PORTABLE CHEST - 1 VIEW  COMPARISON:  CT 06/14/2014, chest x-ray 06/11/2014.  FINDINGS: Right IJ line in good anatomic position. Left chest tube is noted projected over the lower left chest. No pneumothorax. Cardiomegaly is noted. Should be noted prior CT of the  chest of 06/14/2014 demonstrated a pericardial effusion. Left pleural effusions present. Basilar atelectasis and/or infiltrate noted on the left. No pneumothorax. No acute osseous abnormality .  IMPRESSION: 1. Right IJ line in good anatomic position. Left chest tube noted over lower medial left chest. No pneumothorax. 2. Prominent cardiomegaly. Reference is made to prior chest CT of 06/14/2014 which demonstrates a pericardial effusion. 3. Left lower lobe atelectasis and/or infiltrate with small left effusion. Pleural effusion appears diminished from prior CT of 06/14/2014.   Electronically Signed   By: Marcello Moores  Register   On: 06/16/2014 15:21    Medications: I have reviewed the patient's current medications. Scheduled Meds: . acetaminophen  1,000 mg Oral 4 times per day   Or  . acetaminophen (TYLENOL) oral liquid 160 mg/5 mL  1,000 mg Oral 4 times per day  . antiseptic oral rinse  7 mL Mouth Rinse BID  . bisacodyl  10 mg Oral Daily  . colchicine  0.6 mg Oral BID  . doxepin  50 mg Oral QHS  . escitalopram  10 mg Oral Daily  . fentaNYL   Intravenous 6 times per day  . guaiFENesin  1,200 mg Oral BID  . ibuprofen  600 mg Oral TID  . insulin aspart  0-24 Units Subcutaneous 4 times per day  . modafinil  100 mg Oral Daily  . pantoprazole  40 mg Oral Daily  . prednisoLONE acetate  1 drop Both Eyes TID  . senna-docusate  1 tablet Oral QHS  . sodium chloride  10-40 mL Intracatheter Q12H   Continuous Infusions: . dextrose 5 % and 0.9% NaCl 50 mL/hr at 06/17/14 2141   PRN Meds:.Place/Maintain arterial line **AND** sodium chloride, Place/Maintain arterial line **AND** sodium chloride, diphenhydrAMINE **OR** diphenhydrAMINE, naloxone **AND** sodium chloride, ondansetron (ZOFRAN) IV, oxyCODONE, potassium chloride, sodium chloride, traMADol    Assessment/Plan: Principal Problem:  Cardiac tamponade Active Problems:  Multiple sclerosis  Cardiac/pericardial tamponade  Pericardial effusion,  acute  1. Cardiac tamponade:  s/p urgent pericardiocentesis on 11/1, with recurrent effusion, s/p pericardial window on 11/6 (per Dr Servando Snare minimal effusion). Removed chest tube yesterday Continue colchicine and prednisone and NSAIDS. Mildly elevated WBC, most probably secondary to steroids.   2. Anemia - stable, expected post procedure, we will monitor  3. BP - stable  She can be transferred to stepdown.    LOS: 7 days   Dorothy Spark 06/18/2014

## 2014-06-18 NOTE — Progress Notes (Addendum)
Report called to Marcie Bal, RN on 3W. Patient and belongings transferred to [phone] via wheelchair. Patient in stable condition. Sitting in chair. Marcie Bal, RN at bedside.

## 2014-06-18 NOTE — Progress Notes (Signed)
Patient ID: [name], female   DOB: 02-15-1961, 53 y.o.   MRN: [id number] TCTS DAILY ICU PROGRESS NOTE                   [address]            [place] [address]          [phone]   2 Days Post-Op Procedure(s) (LRB): SUBXYPHOID PERICARDIAL WINDOW (N/A) TRANSESOPHAGEAL ECHOCARDIOGRAM (TEE) (N/A)  Total Length of Stay:  LOS: 7 days   Subjective: Up to chair feels better today  Objective: Vital signs in last 24 hours: Temp:  [97.6 F (36.4 C)-97.9 F (36.6 C)] 97.6 F (36.4 C) (11/08 0000) Pulse Rate:  [53-80] 58 (11/08 0700) Cardiac Rhythm:  [-] Normal sinus rhythm (11/08 0730) Resp:  [11-26] 12 (11/08 0800) BP: (105-134)/(55-80) 125/72 mmHg (11/08 0700) SpO2:  [91 %-100 %] 100 % (11/08 0800) Arterial Line BP: (122-126)/(66-67) 126/66 mmHg (11/07 1000)  Filed Weights   06/11/14 0927 06/12/14 1832  Weight: 250 lb (113.399 kg) 263 lb 14.3 oz (119.7 kg)    Weight change:    Hemodynamic parameters for last 24 hours:    Intake/Output from previous day: 11/07 0701 - 11/08 0700 In: 2490.5 [P.O.:1080; I.V.:1410.5] Out: 1445 [Urine:1440; Chest Tube:5]  Intake/Output this shift:    Current Meds: Scheduled Meds: . acetaminophen  1,000 mg Oral 4 times per day   Or  . acetaminophen (TYLENOL) oral liquid 160 mg/5 mL  1,000 mg Oral 4 times per day  . antiseptic oral rinse  7 mL Mouth Rinse BID  . bisacodyl  10 mg Oral Daily  . colchicine  0.6 mg Oral BID  . doxepin  50 mg Oral QHS  . escitalopram  10 mg Oral Daily  . fentaNYL   Intravenous 6 times per day  . guaiFENesin  1,200 mg Oral BID  . ibuprofen  600 mg Oral TID  . insulin aspart  0-24 Units Subcutaneous 4 times per day  . modafinil  100 mg Oral Daily  . pantoprazole  40 mg Oral Daily  . prednisoLONE acetate  1 drop Both Eyes TID  . senna-docusate  1 tablet Oral QHS  . sodium chloride  10-40 mL Intracatheter Q12H   Continuous Infusions: . dextrose 5 % and 0.9% NaCl 50 mL/hr at 06/17/14  2141   PRN Meds:.Place/Maintain arterial line **AND** sodium chloride, Place/Maintain arterial line **AND** sodium chloride, diphenhydrAMINE **OR** diphenhydrAMINE, naloxone **AND** sodium chloride, ondansetron (ZOFRAN) IV, oxyCODONE, potassium chloride, sodium chloride, traMADol  General appearance: alert, cooperative, appears older than stated age and no distress Neurologic: intact Heart: regular rate and rhythm, S1, S2 normal, no murmur, click, rub or gallop Lungs: diminished breath sounds bibasilar Abdomen: soft, non-tender; bowel sounds normal; no masses,  no organomegaly Extremities: extremities normal, atraumatic, no cyanosis or edema and Homans sign is negative, no sign of DVT Wound: wound intact, durabond on wound  Lab Results: CBC: Recent Labs  06/17/14 0332 06/18/14 0300  WBC 7.3 13.7*  HGB 10.4* 10.8*  HCT 31.2* 32.3*  PLT 331 407*   BMET:  Recent Labs  06/17/14 0332 06/18/14 0300  NA 135* 137  K 4.2 4.1  CL 100 101  CO2 24 24  GLUCOSE 164* 143*  BUN 7 10  CREATININE 0.59 0.74  CALCIUM 8.6 8.9    PT/INR:  Recent Labs  06/15/14 1710  LABPROT 14.1  INR 1.08   Radiology: Dg Chest Port 1 View  06/16/2014  CLINICAL DATA:  History pericardial effusion.  History pneumothorax.  EXAM: PORTABLE CHEST - 1 VIEW  COMPARISON:  CT 06/14/2014, chest x-ray 06/11/2014.  FINDINGS: Right IJ line in good anatomic position. Left chest tube is noted projected over the lower left chest. No pneumothorax. Cardiomegaly is noted. Should be noted prior CT of the chest of 06/14/2014 demonstrated a pericardial effusion. Left pleural effusions present. Basilar atelectasis and/or infiltrate noted on the left. No pneumothorax. No acute osseous abnormality .  IMPRESSION: 1. Right IJ line in good anatomic position. Left chest tube noted over lower medial left chest. No pneumothorax. 2. Prominent cardiomegaly. Reference is made to prior chest CT of 06/14/2014 which demonstrates a pericardial  effusion. 3. Left lower lobe atelectasis and/or infiltrate with small left effusion. Pleural effusion appears diminished from prior CT of 06/14/2014.   Electronically Signed   By: Marcello Moores  Register   On: 06/16/2014 15:21     Assessment/Plan: S/P Procedure(s) (LRB): SUBXYPHOID PERICARDIAL WINDOW (N/A) TRANSESOPHAGEAL ECHOCARDIOGRAM (TEE) (N/A) Mobilize From surgical stand point could  transfer to step-down: transfer per cardiology service  Cultures negative, question duration of antibiotics  Mild leucocytosis likely related to steroid bolus    [name] 06/18/2014 8:19 AM

## 2014-06-18 NOTE — Progress Notes (Signed)
Pt walked the circle of the unit from her room & back. Pt tolerated well although we stopped for a second for the pt to catch her breath. Pt  c/o of some sharp lt sided pain under the lt breast which was relieved by sitting in the bed. Pt state the pain was worse with deep breaths. Will continue to monitor the pt. Hoover Brunette, RN

## 2014-06-18 NOTE — Progress Notes (Signed)
  Echocardiogram 2D Echocardiogram has been performed.  [name] 06/18/2014, 1:51 PM

## 2014-06-19 LAB — CBC
HCT: 33.3 % — ABNORMAL LOW (ref 36.0–46.0)
Hemoglobin: 10.8 g/dL — ABNORMAL LOW (ref 12.0–15.0)
MCH: 29.2 pg (ref 26.0–34.0)
MCHC: 32.4 g/dL (ref 30.0–36.0)
MCV: 90 fL (ref 78.0–100.0)
Platelets: 349 10*3/uL (ref 150–400)
RBC: 3.7 MIL/uL — AB (ref 3.87–5.11)
RDW: 13.1 % (ref 11.5–15.5)
WBC: 10.3 10*3/uL (ref 4.0–10.5)

## 2014-06-19 LAB — GLUCOSE, CAPILLARY
Glucose-Capillary: 105 mg/dL — ABNORMAL HIGH (ref 70–99)
Glucose-Capillary: 117 mg/dL — ABNORMAL HIGH (ref 70–99)
Glucose-Capillary: 132 mg/dL — ABNORMAL HIGH (ref 70–99)
Glucose-Capillary: 96 mg/dL (ref 70–99)

## 2014-06-19 LAB — URINE CULTURE
Colony Count: NO GROWTH
Culture: NO GROWTH
Special Requests: NORMAL

## 2014-06-19 MED ORDER — BISMUTH SUBSALICYLATE 262 MG/15ML PO SUSP
30.0000 mL | ORAL | Status: DC | PRN
  Filled 2014-06-19: qty 236

## 2014-06-19 MED ORDER — BISMUTH SUBSALICYLATE 262 MG PO CHEW
524.0000 mg | CHEWABLE_TABLET | ORAL | Status: DC | PRN
  Administered 2014-06-19 (×3): 524 mg via ORAL
  Filled 2014-06-19 (×6): qty 2

## 2014-06-19 MED ORDER — OXYCODONE HCL 5 MG PO TABS
15.0000 mg | ORAL_TABLET | ORAL | Status: DC | PRN
  Administered 2014-06-19 – 2014-06-20 (×4): 15 mg via ORAL
  Filled 2014-06-19 (×4): qty 3

## 2014-06-19 NOTE — Progress Notes (Signed)
Fentanyl PCA stopped. Unable to waste remaining Fentanyl in Pyxis. 49mL (2mcg/mL) or 400 mcg of Fentanyl wasted in sink. Waste verified by Carroll Kinds, RN.

## 2014-06-19 NOTE — Progress Notes (Signed)
Pt c/o of cp bilaterally that's worse with movement. Pt thinks it's d/t her not taking as much fentanyl  & also to laying still in the bed. VS stable & charted. Spoke with cardiologist on call. No new orders given at this time. Will continue to monitor the pt & if the pain continues or gets worse an ekg will be done. Hoover Brunette, RN

## 2014-06-19 NOTE — Progress Notes (Addendum)
[address]       [place] [address]             [phone]      3 Days Post-Op Procedure(s) (LRB): SUBXYPHOID PERICARDIAL WINDOW (N/A) TRANSESOPHAGEAL ECHOCARDIOGRAM (TEE) (N/A) Subjective: Feels ok, not SOB, mild soreness  Objective: Vital signs in last 24 hours: Temp:  [97.3 F (36.3 C)-98.4 F (36.9 C)] 97.8 F (36.6 C) (11/09 0656) Pulse Rate:  [53-98] 70 (11/09 0656) Cardiac Rhythm:  [-] Normal sinus rhythm (11/08 1500) Resp:  [12-22] 14 (11/09 0656) BP: (111-143)/(60-96) 117/61 mmHg (11/09 0656) SpO2:  [96 %-100 %] 98 % (11/09 0656) FiO2 (%):  [21 %-44 %] 44 % (11/09 0025)  Hemodynamic parameters for last 24 hours:    Intake/Output from previous day: 11/08 0701 - 11/09 0700 In: 902 [P.O.:540; I.V.:362] Out: 500 [Urine:500] Intake/Output this shift:    General appearance: alert, cooperative and no distress Heart: regular rate and rhythm and no rub Lungs: dim in the bases Wound: incis healing well  Lab Results:  Recent Labs  06/17/14 0332 06/18/14 0300  WBC 7.3 13.7*  HGB 10.4* 10.8*  HCT 31.2* 32.3*  PLT 331 407*   BMET:  Recent Labs  06/17/14 0332 06/18/14 0300  NA 135* 137  K 4.2 4.1  CL 100 101  CO2 24 24  GLUCOSE 164* 143*  BUN 7 10  CREATININE 0.59 0.74  CALCIUM 8.6 8.9    PT/INR: No results for input(s): LABPROT, INR in the last 72 hours. ABG    Component Value Date/Time   PHART 7.354 06/17/2014 0353   HCO3 25.8* 06/17/2014 0353   TCO2 27 06/17/2014 0353   ACIDBASEDEF 3.0* 06/16/2014 1707   O2SAT 92.0 06/17/2014 0353   CBG (last 3)   Recent Labs  06/18/14 1706 06/19/14 0021 06/19/14 0632  GLUCAP 108* 132* 117*    Meds Scheduled Meds: . acetaminophen  1,000 mg Oral 4 times per day   Or  . acetaminophen (TYLENOL) oral liquid 160 mg/5 mL  1,000 mg Oral 4 times per day  . antiseptic oral rinse  7 mL Mouth Rinse BID  . bisacodyl  10 mg Oral Daily  . colchicine  0.6 mg Oral BID  . doxepin  50  mg Oral QHS  . escitalopram  10 mg Oral Daily  . fentaNYL   Intravenous 6 times per day  . guaiFENesin  1,200 mg Oral BID  . ibuprofen  600 mg Oral TID  . insulin aspart  0-24 Units Subcutaneous 4 times per day  . modafinil  100 mg Oral Daily  . pantoprazole  40 mg Oral Daily  . prednisoLONE acetate  1 drop Both Eyes TID  . senna-docusate  1 tablet Oral QHS  . sodium chloride  10-40 mL Intracatheter Q12H   Continuous Infusions: . dextrose 5 % and 0.9% NaCl 50 mL/hr at 06/17/14 2141   PRN Meds:.Place/Maintain arterial line **AND** sodium chloride, Place/Maintain arterial line **AND** sodium chloride, bismuth subsalicylate, diphenhydrAMINE **OR** diphenhydrAMINE, naloxone **AND** sodium chloride, ondansetron (ZOFRAN) IV, oxyCODONE, potassium chloride, sodium chloride, traMADol  Xrays No results found.  Assessment/Plan: S/P Procedure(s) (LRB): SUBXYPHOID PERICARDIAL WINDOW (N/A) TRANSESOPHAGEAL ECHOCARDIOGRAM (TEE) (N/A)  1 doing well 2 d/c pca 3 push pulm toilet/ambulation as able 4 cont current medical RX 5 sugars controlled 6 surgical path pending  LOS: 8 days    GOLD,WAYNE E 06/19/2014  Path- inflammation w/o malignancy, tissue cult negative prob viral pericarditis- stop antibiotics  Check PA/Lat CXR tomorrow patient examined and medical record reviewed,agree with above note. VAN TRIGT III,PETER 06/19/2014

## 2014-06-19 NOTE — Progress Notes (Signed)
     SUBJECTIVE: Feeling better. Still with chest pain with movement but improved.   Tele: sinus  BP 117/61 mmHg  Pulse 70  Temp(Src) 97.8 F (36.6 C) (Oral)  Resp 14  Ht 5\' 10"  (1.778 m)  Wt 263 lb 14.3 oz (119.7 kg)  BMI 37.86 kg/m2  SpO2 98%  Intake/Output Summary (Last 24 hours) at 06/19/14 9450 Last data filed at 06/18/14 1500  Gross per 24 hour  Intake    902 ml  Output    500 ml  Net    402 ml    PHYSICAL EXAM General: Well developed, well nourished, in no acute distress. Alert and oriented x 3.  Psych:  Good affect, responds appropriately Neck: No JVD. No masses noted.  Lungs: Clear bilaterally with no wheezes or rhonci noted.  Heart: RRR with no murmurs noted. Abdomen: Bowel sounds are present. Soft, non-tender.  Extremities: No lower extremity edema.   LABS: Basic Metabolic Panel:  Recent Labs  06/17/14 0332 06/18/14 0300  NA 135* 137  K 4.2 4.1  CL 100 101  CO2 24 24  GLUCOSE 164* 143*  BUN 7 10  CREATININE 0.59 0.74  CALCIUM 8.6 8.9   CBC:  Recent Labs  06/17/14 0332 06/18/14 0300  WBC 7.3 13.7*  HGB 10.4* 10.8*  HCT 31.2* 32.3*  MCV 85.7 86.1  PLT 331 407*   Current Meds: . acetaminophen  1,000 mg Oral 4 times per day   Or  . acetaminophen (TYLENOL) oral liquid 160 mg/5 mL  1,000 mg Oral 4 times per day  . antiseptic oral rinse  7 mL Mouth Rinse BID  . bisacodyl  10 mg Oral Daily  . colchicine  0.6 mg Oral BID  . doxepin  50 mg Oral QHS  . escitalopram  10 mg Oral Daily  . fentaNYL   Intravenous 6 times per day  . guaiFENesin  1,200 mg Oral BID  . ibuprofen  600 mg Oral TID  . insulin aspart  0-24 Units Subcutaneous 4 times per day  . modafinil  100 mg Oral Daily  . pantoprazole  40 mg Oral Daily  . prednisoLONE acetate  1 drop Both Eyes TID  . senna-docusate  1 tablet Oral QHS  . sodium chloride  10-40 mL Intracatheter Q12H   Echo 06/18/14: Left ventricle: The cavity size was normal. Wall thickness was normal. The  estimated ejection fraction was 55%. - Pericardium, extracardiac: Trivial posterior pericardial effusion.  ASSESSMENT AND PLAN:  1. Cardiac tamponade: s/p urgent pericardiocentesis on 11/1, with recurrent effusion, s/p pericardial window on 11/6. Echo 06/18/14 with minimal effusion. Cultures negative. Cytology pending.  Continue colchicine, prednisone and NSAIDS. D/C Fentanyl PCA. Prn narcotics. Antibiotics stopped yesterday.   2. Anemia: stable. Repeat CBC today  3. HTN: BP stable  Ambulate today.  [name]  11/9/20157:23 AM

## 2014-06-20 ENCOUNTER — Inpatient Hospital Stay (HOSPITAL_COMMUNITY): Payer: 59

## 2014-06-20 ENCOUNTER — Encounter (HOSPITAL_COMMUNITY): Payer: Self-pay | Admitting: Cardiothoracic Surgery

## 2014-06-20 ENCOUNTER — Encounter (HOSPITAL_COMMUNITY): Admission: RE | Admit: 2014-06-20 | Payer: 59 | Source: Ambulatory Visit

## 2014-06-20 LAB — GLUCOSE, CAPILLARY
GLUCOSE-CAPILLARY: 80 mg/dL (ref 70–99)
Glucose-Capillary: 105 mg/dL — ABNORMAL HIGH (ref 70–99)

## 2014-06-20 LAB — TISSUE CULTURE
CULTURE: NO GROWTH
Culture: NO GROWTH
GRAM STAIN: NONE SEEN

## 2014-06-20 LAB — BASIC METABOLIC PANEL
Anion gap: 11 (ref 5–15)
BUN: 14 mg/dL (ref 6–23)
CALCIUM: 8.8 mg/dL (ref 8.4–10.5)
CHLORIDE: 100 meq/L (ref 96–112)
CO2: 27 meq/L (ref 19–32)
Creatinine, Ser: 0.76 mg/dL (ref 0.50–1.10)
GFR calc Af Amer: >90 mL/min (ref >90)
GFR calc non Af Amer: >90 mL/min (ref >90)
Glucose, Bld: 82 mg/dL (ref 70–99)
Potassium: 4.1 mEq/L (ref 3.7–5.3)
Sodium: 138 mEq/L (ref 137–147)

## 2014-06-20 LAB — BODY FLUID CULTURE: Culture: NO GROWTH

## 2014-06-20 MED ORDER — IBUPROFEN 600 MG PO TABS: 600.0000 mg | ORAL_TABLET | Freq: Three times a day (TID) | ORAL | Status: DC

## 2014-06-20 MED ORDER — FUROSEMIDE 10 MG/ML IJ SOLN
40.0000 mg | Freq: Once | INTRAMUSCULAR | Status: AC
  Administered 2014-06-20: 40 mg via INTRAVENOUS
  Filled 2014-06-20: qty 4

## 2014-06-20 MED ORDER — PANTOPRAZOLE SODIUM 40 MG PO TBEC: 40.0000 mg | DELAYED_RELEASE_TABLET | Freq: Every day | ORAL | Status: DC

## 2014-06-20 MED ORDER — COLCHICINE 0.6 MG PO TABS: 0.6000 mg | ORAL_TABLET | Freq: Two times a day (BID) | ORAL | Status: DC

## 2014-06-20 MED ORDER — FUROSEMIDE 40 MG PO TABS: 40.0000 mg | ORAL_TABLET | Freq: Every day | ORAL | Status: DC

## 2014-06-20 MED ORDER — OXYCODONE HCL 15 MG PO TABS: 15.0000 mg | ORAL_TABLET | ORAL | Status: DC | PRN

## 2014-06-20 MED ORDER — POTASSIUM CHLORIDE CRYS ER 20 MEQ PO TBCR
20.0000 meq | EXTENDED_RELEASE_TABLET | Freq: Every day | ORAL | Status: DC
Start: 2014-06-20 — End: 2014-06-27

## 2014-06-20 MED ORDER — POTASSIUM CHLORIDE CRYS ER 20 MEQ PO TBCR
20.0000 meq | EXTENDED_RELEASE_TABLET | Freq: Every day | ORAL | Status: DC
  Administered 2014-06-20: 20 meq via ORAL
  Filled 2014-06-20: qty 1

## 2014-06-20 NOTE — Progress Notes (Addendum)
[address]       [place] [address]             [id number]      4 Days Post-Op Procedure(s) (LRB): SUBXYPHOID PERICARDIAL WINDOW (N/A) TRANSESOPHAGEAL ECHOCARDIOGRAM (TEE) (N/A) Subjective: conts to feel better  Objective: Vital signs in last 24 hours: Temp:  [97.8 F (36.6 C)-98 F (36.7 C)] 97.9 F (36.6 C) (11/10 0415) Pulse Rate:  [62-69] 62 (11/10 0415) Cardiac Rhythm:  [-] Normal sinus rhythm (11/10 0007) Resp:  [13-16] 16 (11/10 0415) BP: (126-132)/(64-78) 132/74 mmHg (11/10 0415) SpO2:  [97 %-100 %] 100 % (11/10 0415) Weight:  [278 lb 4.8 oz (126.236 kg)] 278 lb 4.8 oz (126.236 kg) (11/10 0508)  Hemodynamic parameters for last 24 hours:    Intake/Output from previous day: 11/09 0701 - 11/10 0700 In: 400 [P.O.:360; I.V.:40] Out: -  Intake/Output this shift:    General appearance: alert, cooperative and no distress Heart: regular rate and rhythm and no rub Lungs: mildly dim in the bases Extremities: +LE edema Wound: healing well  Lab Results:  Recent Labs  06/18/14 0300 06/19/14 0850  WBC 13.7* 10.3  HGB 10.8* 10.8*  HCT 32.3* 33.3*  PLT 407* 349   BMET:  Recent Labs  06/18/14 0300 06/20/14 0515  NA 137 138  K 4.1 4.1  CL 101 100  CO2 24 27  GLUCOSE 143* 82  BUN 10 14  CREATININE 0.74 0.76  CALCIUM 8.9 8.8    PT/INR: No results for input(s): LABPROT, INR in the last 72 hours. ABG    Component Value Date/Time   PHART 7.354 06/17/2014 0353   HCO3 25.8* 06/17/2014 0353   TCO2 27 06/17/2014 0353   ACIDBASEDEF 3.0* 06/16/2014 1707   O2SAT 92.0 06/17/2014 0353   CBG (last 3)   Recent Labs  06/19/14 1818 06/20/14 0003 06/20/14 0548  GLUCAP 105* 105* 80    Meds Scheduled Meds: . acetaminophen  1,000 mg Oral 4 times per day   Or  . acetaminophen (TYLENOL) oral liquid 160 mg/5 mL  1,000 mg Oral 4 times per day  . antiseptic oral rinse  7 mL Mouth Rinse BID  . bisacodyl  10 mg Oral Daily  . colchicine   0.6 mg Oral BID  . doxepin  50 mg Oral QHS  . escitalopram  10 mg Oral Daily  . guaiFENesin  1,200 mg Oral BID  . ibuprofen  600 mg Oral TID  . insulin aspart  0-24 Units Subcutaneous 4 times per day  . modafinil  100 mg Oral Daily  . pantoprazole  40 mg Oral Daily  . prednisoLONE acetate  1 drop Both Eyes TID  . senna-docusate  1 tablet Oral QHS  . sodium chloride  10-40 mL Intracatheter Q12H   Continuous Infusions:  PRN Meds:.Place/Maintain arterial line **AND** sodium chloride, Place/Maintain arterial line **AND** sodium chloride, bismuth subsalicylate, oxyCODONE, potassium chloride, sodium chloride, traMADol  Xrays Dg Chest Port 1 View  06/20/2014   CLINICAL DATA:  Pericarditis with recent pericardial window  EXAM: PORTABLE CHEST - 1 VIEW  COMPARISON:  06/16/2014  FINDINGS: Cardiac shadow is mildly prominent but stable. The right jugular central line is again seen. The lungs are well aerated bilaterally without focal infiltrate. The previously seen pericardial drain is not well visualized on this exam clinical correlation is recommended. No acute bony abnormality is noted.  IMPRESSION: Improved aeration in the left lung base. No new focal abnormality is  noted.   Electronically Signed   By: Inez Catalina M.D.   On: 06/20/2014 07:38    Assessment/Plan: S/P Procedure(s) (LRB): SUBXYPHOID PERICARDIAL WINDOW (N/A) TRANSESOPHAGEAL ECHOCARDIOGRAM (TEE) (N/A)  1 clinically stable from surgical standpoint. Does have some increased LE edema so may need diuretic 2 appears hemodyn stable 3 BMET wnl 4 CXR - aeration improved 5 home when ok with cardiologist  LOS: 9 days    GOLD,WAYNE E 06/20/2014  Doing well DC instructions reviewed with patient Leave chest tube suture in place because of hx steroids.\, immunosuppression patient examined and medical record reviewed,agree with above note. VAN TRIGT III,Kelce Bouton 06/20/2014

## 2014-06-20 NOTE — Discharge Summary (Signed)
Physician Discharge Summary  Patient ID: [name] MRN: [id number] DOB/AGE: Nov 22, 1960 53 y.o.   Primary Cardiologist: Dr. Sallyanne Kuster  Admit date: 06/11/2014 Discharge date: 06/20/2014  Admission Diagnoses: Acute Pericardial Effusion/Cardiac Tamponade  Discharge Diagnoses:  Principal Problem:   Cardiac tamponade Active Problems:   Multiple sclerosis   Cardiac/pericardial tamponade   Pericardial effusion, acute   Pericardial effusion   Discharged Condition: stable  Hospital Course: This is a 53 y.o. female with a past medical history significant for multiple sclerosis and HTN, no cardiac illness, who presented to [facility] on 06/11/14 with extreme fatigue, presyncope and dyspnea after a week or so of "bronchitis" - mostly pleuritic pain, less cough, no upper airway symptoms. She presented with hypotension and tachycardia. Hemodynamics improved in the ED with IV fluids but BP remained low and HR fast. A STAT Echo was obtained in the ED and revealed a large effusion, a dilated IVC and Tamponade physiology. This was felt to be likely due to acute pericarditis. She was placed on colchicine, prednisone and NSAIDS. She underwent an urgent, fluoroscopic directed, sub-xyphoid, pericardiocentesis by Dr. Burt Knack. She had improvement in symptoms initially, however developed recurrent dyspnea several days post-procedure. A repeat echo demonstrated re accumulation of pericardial fluid. This was also confirmed by chest CT. Cardiothoracic Surgery was consulted and decision was made to proceed with a pericardial window. This was performed by Dr. Prescott Gum. 100 cc of hemorraghic pericardial fluid was removed. The pericardial space was irrigated and drained. Pericardial fluid, pathology and cultures were all negative. The etiology of her pericarditis and subsequent pericardial effusion was felt to be likely viral. She was continued on colchicine and Ibuprofen and placed on PPI therapy. Her symptoms resolved and she  had no further dyspnea. She remained hemodynamically stable. A follow-up 2D echo, post-op, revealed only a trivial posterior pericardial effusion. EF was estimated at 55%. CXR showed improbed aeration.   Prior to discharge, the patient noted weight gain and increased LEE, but no dyspnea, orthopnea or PND. Lung exam was normal. It was felt that her weight gain may have been due to IVFs given during her hospitalization. It was recommended that she receive a dose of IV lasix prior to discharge and for her to continue a standing dose of 40 mg PO daily with follow-up BMP in 1 week.   On hospital day 9, she was seen and examined both by Dr. Prescott Gum and Dr. Angelena Form and it was determined that she was stable for discharge home. Post-hospital f/u has been arranged with Lyda Jester, PA-C, on 06/29/14. She will be followed long term by Dr. Lorenza Cambridge. F/U with Dr. Prescott Gum is scheduled for 07/05/14.   Consults: CT Surgery  Significant Diagnostic Studies:    STAT 2D echo 06/11/14 EMERGENCY DEPARTMENT [place] CARDIAC EXAM  "Study: Limited Ultrasound of the heart and pericardium"  INDICATIONS:Tachycardia and Dyspnea  Multiple views of the heart and pericardium were obtained in real-time with a multi-frequency probe.  PERFORMED SF:KCLEXN  IMAGES ARCHIVED?: Yes  FINDINGS: Large effusion, IVC dilated and Tamponade physiology present  LIMITATIONS: Body habitus  VIEWS USED: Subcostal 4 chamber, Parasternal long axis, Parasternal short axis, Apical 4 chamber and Inferior Vena Cava  INTERPRETATION: Cardiac activity present, Pericardial effusion present, Cardiac tamponade present, Probable elevated CVP and Decreased contractility  Cardiology consult emergently for concern for [place] not. Bedside ultrasound reperformed by cardiologist himself who agrees with the assessment and plan to take patient emergently to drain.  Cardiac tamponade, pericardial effusion, Dyspnea  Post Operative Echo  06/18/14 LV EF: 55%  ------------------------------------------------------------------- Indications:   Pericardial effusion 423.9.  ------------------------------------------------------------------- Study Conclusions  - Left ventricle: The cavity size was normal. Wall thickness was normal. The estimated ejection fraction was 55%. - Pericardium, extracardiac: Trivial posterior pericardial effusion.  Transthoracic echocardiography. M-mode, limited 2D, limited spectral Doppler, and color Doppler. Birthdate: Patient birthdate: Sep 26, 1960. Age: Patient is 53 yr old. Sex: Gender: female. Blood pressure:   125/73 Patient status: Inpatient. Study date: Study date: 06/18/2014. Study time: 01:28 PM. Location: ICU/CCU    Treatments: See Hospital Course  Discharge Exam: Blood pressure 141/61, pulse 70, temperature 98.4 F (36.9 C), temperature source Oral, resp. rate 18, height 5\' 10"  (1.778 m), weight 278 lb 4.8 oz (126.236 kg), SpO2 96 %.   Disposition: Final discharge disposition not confirmed      Discharge Instructions    Diet - low sodium heart healthy    Complete by:  As directed      Increase activity slowly    Complete by:  As directed             Medication List    STOP taking these medications        Influenza Vac Split Quad 0.25 ML injection  Commonly known as:  FLUZONE     lisinopril 2.5 MG tablet  Commonly known as:  PRINIVIL,ZESTRIL      TAKE these medications        Armodafinil 150 MG tablet  Commonly known as:  NUVIGIL  Take 1 tablet (150 mg total) by mouth daily.     colchicine 0.6 MG tablet  Take 1 tablet (0.6 mg total) by mouth 2 (two) times daily.     doxepin 50 MG capsule  Commonly known as:  SINEQUAN  Take 1 capsule (50 mg total) by mouth at bedtime.     furosemide 40 MG tablet  Commonly known as:  LASIX  Take 1 tablet (40 mg total) by mouth daily.  Start taking on:  06/21/2014     ibuprofen 600 MG tablet    Commonly known as:  ADVIL,MOTRIN  Take 1 tablet (600 mg total) by mouth 3 (three) times daily.     LEXAPRO 10 MG tablet  Generic drug:  escitalopram  Take 10 mg by mouth daily.     natalizumab 300 mg in sodium chloride 0.9 % 100 mL  Inject 300 mg into the vein every 28 (twenty-eight) days.     oxyCODONE 15 MG immediate release tablet  Commonly known as:  ROXICODONE  Take 1 tablet (15 mg total) by mouth every 4 (four) hours as needed for severe pain.     pantoprazole 40 MG tablet  Commonly known as:  PROTONIX  Take 1 tablet (40 mg total) by mouth daily.     potassium chloride SA 20 MEQ tablet  Commonly known as:  K-DUR,KLOR-CON  Take 1 tablet (20 mEq total) by mouth daily.     prednisoLONE acetate 1 % ophthalmic suspension  Commonly known as:  PRED FORTE  Place 1 drop into both eyes 3 (three) times daily.       Follow-up Information    Follow up with VAN Wilber Oliphant, MD On 07/05/2014.   Specialty:  Cardiothoracic Surgery   Why:  PA/LAT CXR to be taken (at [facility] which is in the same building as Dr. Lucianne Lei Trigt's office) on 07/05/2014 at 3:00 pm;Appointment time with Dr. Prescott Gum is at 4:00 pm   Contact information:  [address] [place] [address] [phone]       Follow up with Lyda Jester, PA-C On 06/29/2014.   Specialty:  Cardiology   Why:  4:00 pm    Contact information:   [address] [place] [address] [place], INCLUDING PHYSICIAN TIME: >30 MINUTES  Signed: Lyda Jester 06/20/2014, 12:37 PM

## 2014-06-20 NOTE — Discharge Instructions (Signed)
Chest Wall Pain Chest wall pain is pain felt in or around the chest bones and muscles. It may take up to 6 weeks to get better. It may take longer if you are active. Chest wall pain can happen on its own. Other times, things like germs, injury, coughing, or exercise can cause the pain. HOME CARE   Avoid activities that make you tired or cause pain. Try not to use your chest, belly (abdominal), or side muscles. Do not use heavy weights.  Put ice on the sore area.  Put ice in a plastic bag.  Place a towel between your skin and the bag.  Leave the ice on for 15-20 minutes for the first 2 days.  Only take medicine as told by your doctor. GET HELP RIGHT AWAY IF:   You have more pain or are very uncomfortable.  You have a fever.  Your chest pain gets worse.  You have new problems.  You feel sick to your stomach (nauseous) or throw up (vomit).  You start to sweat or feel lightheaded.  You have a cough with mucus (phlegm).  You cough up blood. MAKE SURE YOU:   Understand these instructions.  Will watch your condition.  Will get help right away if you are not doing well or get worse. Document Released: 01/14/2008 Document Revised: 10/20/2011 Document Reviewed: 03/24/2011 [facility] Patient Information 2015 [place], [place]. This information is not intended to replace advice given to you by your health care provider. Make sure you discuss any questions you have with your health care provider.  Pericarditis Pericarditis is swelling (inflammation) of the pericardium. The pericardium is a thin, double-layered, fluid-filled tissue sac that surrounds the heart. The purpose of the pericardium is to contain the heart in the chest cavity and keep the heart from overexpanding. Different types of pericarditis can occur, such as:  Acute pericarditis. Inflammation can develop suddenly in acute pericarditis.  Chronic pericarditis. Inflammation develops gradually and is long-lasting in chronic  pericarditis.  Constrictive pericarditis. In this type of pericarditis, the layers of the pericardium stiffen and develop scar tissue. The scar tissue thickens and sticks together. This makes it difficult for the heart to pump and work as it normally does. CAUSES  Pericarditis can be caused from different conditions, such as:  A bacterial, fungal or viral infection.  After a heart attack (myocardial infarction).  After open-heart surgery (coronary bypass graft surgery).  Auto-immune conditions such as lupus, rheumatoid arthritis or scleroderma.  Kidney failure.  Low thyroid condition (hypothyroidism).  Cancer from another part of the body that has spread (metastasized) to the pericardium.  Chest injury or trauma.  After radiation treatment.  Certain medicines. SYMPTOMS  Symptoms of pericarditis can include:  Chest pain. Chest pain symptoms may increase when laying down and may be relieved when sitting up and leaning forward.  A chronic, dry cough.  Heart palpitations. These may feel like rapid, fluttering or pounding heart beats.  Chest pain may be worse when swallowing.  Dizziness or fainting.  Tiredness, fatigue or lethargy.  Fever. DIAGNOSIS  Pericarditis is diagnosed by the following:  A physical exam. A heart sound called a pericardial friction rub may be heard when your caregiver listens to your heart.  Blood work. Blood may be drawn to check for an infection and to look at your blood chemistry.  Electrocardiography. During electrocardiography your heart's electrical activity is monitored and recorded with a tracing on paper (electrocardiogram [ECG]).  Echocardiography.  Computed tomography (CT).  Magnetic resonance  image (MRI). TREATMENT  To treat pericarditis, it is important to know the cause of it. The cause of pericarditis determines the treatment.   If the cause of pericarditis is due to an infection, treatment is based on the type of infection.  If an infection is suspected in the pericardial fluid, a procedure called a pericardial fluid culture and biopsy may be done. This takes a sample of the pericardial fluid. The sample is sent to a lab which runs tests on the pericardial fluid to check for an infection.  If the autoimmune disease is the cause, treatment of the autoimmune condition will help improve the pericarditis.  If the cause of pericarditis is not known, anti-inflammatory medicines may be used to help decrease the inflammation.  Surgery may be needed. The following are types of surgeries or procedures that may be done to treat pericarditis:  Pericardial window. A pericardial window makes a cut (incision) into the pericardial sac. This allows excess fluid in the pericardium to drain.  Pericardiocentesis. A pericardiocentesis is also known as a pericardial tap. This procedure uses a needle that is guided by X-ray to drain (aspirate) excess fluid from the pericardium.  Pericardiectomy. A pericardiectomy removes part or all of the pericardium. HOME CARE INSTRUCTIONS   Do not smoke. If you smoke, quit. Your caregiver can help you quit smoking.  Maintain a healthy weight.  Follow an exercise program as told by your caregiver.  If you drink alcohol, do so in moderation.  Eat a heart healthy diet. A registered dietician can help you learn about healthy food choices.  Keep a list of all your medicines with you at all times. Include the name, dose, how often it is taken and how it is taken. SEEK IMMEDIATE MEDICAL CARE IF:   You have chest pain or feelings of chest pressure.  You have sweating (diaphoresis) when at rest.  You have irregular heartbeats (palpitations).  You have rapid, racing heart beats.  You have unexplained fainting episodes.  You feel sick to your stomach (nausea) or vomiting without cause.  You have unexplained weakness. If you develop any of the symptoms which originally made you seek care, call  for local emergency medical help. Do not drive yourself to the hospital. Document Released: 01/21/2001 Document Revised: 10/20/2011 Document Reviewed: 07/30/2011 [facility] Patient Information 2015 [place], [place]. This information is not intended to replace advice given to you by your health care provider. Make sure you discuss any questions you have with your health care provider.  Pericardial Effusion Pericardial effusion is an accumulation of extra fluid in the pericardial space. This is the space between the heart and the sac that surrounds it. This space normally contains a small amount of fluid which serves as lubrication for the heart inside the sac. If the amount of fluid increases, it causes problems for the working of the heart. This is called a heart (cardiac) tamponade.  CAUSES  A higher incidence of pericardial effusion is associated with certain diseases. Some common causes of pericardial effusion are:  Infections (bacterial, viral, fungal, from AIDS, etc.).  Cancer which has spread to the pericardial sac.  Fluid accumulation in the sac after a heart attack or open heart surgery.  Injury (a fall with chest injury or as a result of a knife or gunshot wound) with bleeding into the pericardial sac.  Immune diseases (such as Rheumatoid Arthritis) and other arthritis conditions.  Reactions (uncommon) to medications. SYMPTOMS  Very small effusions may cause no problems. Even  a small effusion if it comes on rapidly can be deadly. Some common symptoms are:  Chest pain, pressure, discomfort.  Light-headed feeling, fainting.  Cough, shortness of breath.  Feeling of palpitations.  Hiccoughs  Anxiety or confusion DIAGNOSIS  Your caregiver may do a number of blood tests and imaging tests (like X-rays) to help find the cause.   ECHO (Echocardiographic stress test) has become the diagnostic method of choice. This is due to its portability and availability. CT and MRI are also  used, and may be more accurate.  Pericardioscopy, where available, uses a telescope-like instrument to look inside the pericardial sac.  This may be helpful in cases of unexplained pericardial effusions. It allows your caregiver to look at the pericardium and do pericardial biopsies if something abnormal is found.  Sometimes fluid may be removed to help with diagnosing the cause of the accumulation. This is called a pericardiocentesis. TREATMENT  Treatment is directed at removal of the extra pericardial fluid and treating the cause. Small amounts of effusion that are not causing problems can simply be watched. If the fluid is accumulating rapidly and resulting in cardiac tamponade, this can be life-threatening. Emergency removal of fluid must be done.  SEEK IMMEDIATE MEDICAL CARE IF:   You develop chest pain, irregular heart beat (palpitations) or racing heart, shortness of breath, or begin sweating.  You become lightheaded or pass out.  You develop increasing swelling of the legs. Document Released: 03/25/2005 Document Revised: 10/20/2011 Document Reviewed: 03/10/2008 [facility] Patient Information 2015 [place], [place]. This information is not intended to replace advice given to you by your health care provider. Make sure you discuss any questions you have with your health care provider.

## 2014-06-20 NOTE — Progress Notes (Signed)
Patient Profile: 53 y/o female admitted with cardiac tamponade   Subjective: No dyspnea and less pleuritic chest pain. Main complaint is 20 lb weight gain and increased LEE.   Objective: Vital signs in last 24 hours: Temp:  [97.8 F (36.6 C)-98 F (36.7 C)] 97.9 F (36.6 C) (11/10 0415) Pulse Rate:  [62-69] 62 (11/10 0415) Resp:  [13-16] 16 (11/10 0415) BP: (126-132)/(64-78) 132/74 mmHg (11/10 0415) SpO2:  [97 %-100 %] 100 % (11/10 0415) Weight:  [278 lb 4.8 oz (126.236 kg)] 278 lb 4.8 oz (126.236 kg) (11/10 0508) Last BM Date: 06/15/14  Intake/Output from previous day: 11/09 0701 - 11/10 0700 In: 400 [P.O.:360; I.V.:40] Out: -  Intake/Output this shift:    Medications Current Facility-Administered Medications  Medication Dose Route Frequency Provider Last Rate Last Dose  . 0.9 %  sodium chloride infusion   Intra-arterial PRN Ivin Poot, MD      . 0.9 %  sodium chloride infusion   Intra-arterial PRN Ivin Poot, MD      . acetaminophen (TYLENOL) tablet 1,000 mg  1,000 mg Oral 4 times per day Nani Skillern, PA-C   1,000 mg at 06/20/14 9767   Or  . acetaminophen (TYLENOL) solution 1,000 mg  1,000 mg Oral 4 times per day Nani Skillern, PA-C      . antiseptic oral rinse (CPC / CETYLPYRIDINIUM CHLORIDE 0.05%) solution 7 mL  7 mL Mouth Rinse BID Ivin Poot, MD   7 mL at 06/19/14 2159  . bisacodyl (DULCOLAX) EC tablet 10 mg  10 mg Oral Daily Donielle Liston Alba, PA-C   10 mg at 06/19/14 1045  . bismuth subsalicylate (PEPTO BISMOL) chewable tablet 524 mg  524 mg Oral PRN Jacolyn Reedy, MD   524 mg at 06/19/14 2201  . colchicine tablet 0.6 mg  0.6 mg Oral BID Pixie Casino, MD   0.6 mg at 06/19/14 2159  . doxepin (SINEQUAN) capsule 50 mg  50 mg Oral QHS Sherren Mocha, MD   50 mg at 06/19/14 2159  . escitalopram (LEXAPRO) tablet 10 mg  10 mg Oral Daily Sherren Mocha, MD   10 mg at 06/19/14 1044  . guaiFENesin (MUCINEX) 12 hr tablet 1,200 mg   1,200 mg Oral BID Isaiah Serge, NP   1,200 mg at 06/19/14 2159  . ibuprofen (ADVIL,MOTRIN) tablet 600 mg  600 mg Oral TID Sherren Mocha, MD   600 mg at 06/19/14 2159  . insulin aspart (novoLOG) injection 0-24 Units  0-24 Units Subcutaneous 4 times per day Nani Skillern, PA-C   2 Units at 06/19/14 0025  . modafinil (PROVIGIL) tablet 100 mg  100 mg Oral Daily Sherren Mocha, MD   100 mg at 06/12/14 3419  . oxyCODONE (Oxy IR/ROXICODONE) immediate release tablet 15 mg  15 mg Oral Q4H PRN Burnell Blanks, MD   15 mg at 06/20/14 0558  . pantoprazole (PROTONIX) EC tablet 40 mg  40 mg Oral Daily Donielle Liston Alba, PA-C   40 mg at 06/19/14 1044  . potassium chloride 10 mEq in 50 mL *CENTRAL LINE* IVPB  10 mEq Intravenous Daily PRN Donielle Liston Alba, PA-C      . prednisoLONE acetate (PRED FORTE) 1 % ophthalmic suspension 1 drop  1 drop Both Eyes TID Sherren Mocha, MD   1 drop at 06/19/14 2159  . senna-docusate (Senokot-S) tablet 1 tablet  1 tablet Oral QHS Nani Skillern, PA-C   1 tablet at  06/19/14 2159  . sodium chloride 0.9 % injection 10-40 mL  10-40 mL Intracatheter Q12H Ivin Poot, MD   10 mL at 06/19/14 1052  . sodium chloride 0.9 % injection 10-40 mL  10-40 mL Intracatheter PRN Ivin Poot, MD   20 mL at 06/19/14 2201  . traMADol (ULTRAM) tablet 50-100 mg  50-100 mg Oral Q6H PRN Nani Skillern, PA-C   100 mg at 06/19/14 2036    PE: General appearance: alert, cooperative, no distress and moderately obese Neck: no carotid bruit and no JVD Lungs: clear to auscultation bilaterally Heart: regular rate and rhythm and 1/6 SM along LSB Extremities: no pitting edema Pulses: 2+ and symmetric Skin: warm and dry Neurologic: Grossly normal  Lab Results:   Recent Labs  06/18/14 0300 06/19/14 0850  WBC 13.7* 10.3  HGB 10.8* 10.8*  HCT 32.3* 33.3*  PLT 407* 349   BMET  Recent Labs  06/18/14 0300 06/20/14 0515  NA 137 138  K 4.1 4.1  CL 101 100    CO2 24 27  GLUCOSE 143* 82  BUN 10 14  CREATININE 0.74 0.76  CALCIUM 8.9 8.8    Studies CXR 06/20/14 EXAM: PORTABLE CHEST - 1 VIEW  COMPARISON: 06/16/2014  FINDINGS: Cardiac shadow is mildly prominent but stable. The right jugular central line is again seen. The lungs are well aerated bilaterally without focal infiltrate. The previously seen pericardial drain is not well visualized on this exam clinical correlation is recommended. No acute bony abnormality is noted.  IMPRESSION: Improved aeration in the left lung base. No new focal abnormality is noted.  Assessment/Plan  Principal Problem:   Cardiac tamponade Active Problems:   Multiple sclerosis   Cardiac/pericardial tamponade   Pericardial effusion, acute   Pericardial effusion  1. Cardiac Tamponade: s/p urgent pericardiocentesis on 11/1, with recurrent effusion, s/p pericardial window on 11/6. Echo 06/18/14 with minimal effusion. Path- inflammation w/o malignancy, tissue cult negative.  Probable viral pericarditis as etiology. Continue colchicine and ibuprofen.   2. Anemia: stable w/ Hgb at 10.8.  3. HTN: normotensive   3. Weight gain + subjective LEE: Pt notes ~20 lb weight gain over the last several days + increased LEE. No associated dyspnea at rest nor with exertion. Echo 06/18/14 demonstrated normal LVF with EF estimated at 55%. Pt has been receiving PO Lasix intermittently this admission. She may need to go home on a standing dose for several days. ? Dose of IV lasix prior to discharge. Will defer to MD.     LOS: 9 days    Brittainy M. Rosita Fire, PA-C 06/20/2014 7:50 AM  I have personally seen and examined this patient with Lyda Jester, PA-C. I agree with the assessment and plan as outlined above. She is stable post pericardial window for pericardial tamponade. No malignancy on cytology. Will continue colchicine and ibuprofen. Start lasix for weight gain/edema. IV Lasix 40 mg today then start 40 mg  daily at home. Will need f/u [facility] in one week and f/u [facility] office 1-2 weeks.  Follow up long term with Dr. Recardo Evangelist.   MCALHANY,CHRISTOPHER 06/20/2014 10:16 AM

## 2014-06-21 ENCOUNTER — Encounter (HOSPITAL_COMMUNITY): Payer: 59

## 2014-06-27 ENCOUNTER — Telehealth: Payer: Self-pay | Admitting: Cardiology

## 2014-06-27 ENCOUNTER — Other Ambulatory Visit: Payer: Self-pay | Admitting: Cardiology

## 2014-06-27 ENCOUNTER — Other Ambulatory Visit: Payer: Self-pay | Admitting: *Deleted

## 2014-06-27 MED ORDER — POTASSIUM CHLORIDE CRYS ER 20 MEQ PO TBCR: 20.0000 meq | EXTENDED_RELEASE_TABLET | Freq: Every day | ORAL | Status: DC

## 2014-06-27 MED ORDER — FUROSEMIDE 40 MG PO TABS: 40.0000 mg | ORAL_TABLET | Freq: Every day | ORAL | Status: DC

## 2014-06-27 MED ORDER — COLCHICINE 0.6 MG PO TABS: 0.6000 mg | ORAL_TABLET | Freq: Two times a day (BID) | ORAL | Status: DC

## 2014-06-27 NOTE — Telephone Encounter (Signed)
[facility] is calling to get a Diagnosis code and patient is waiting

## 2014-06-27 NOTE — Telephone Encounter (Signed)
SPOKE TO JOANNIE NEED DX CODE FOR BMP- ORDERED BY BRITTAINY (ON WRITTEN PRESCRIPTION ) DX- ICD I31.9,I31.4, Z79.899 GIVEN

## 2014-06-27 NOTE — Telephone Encounter (Signed)
Rx refills were sent to pharmacy.

## 2014-06-28 ENCOUNTER — Encounter (HOSPITAL_COMMUNITY): Payer: Self-pay | Admitting: Emergency Medicine

## 2014-06-28 ENCOUNTER — Observation Stay (HOSPITAL_COMMUNITY)
Admission: EM | Admit: 2014-06-28 | Discharge: 2014-06-30 | Disposition: A | Discharge status: Home / Self Care | Payer: 59 | Attending: Cardiovascular Disease | Admitting: Cardiovascular Disease

## 2014-06-28 ENCOUNTER — Emergency Department (HOSPITAL_COMMUNITY): Payer: 59

## 2014-06-28 DIAGNOSIS — Z880 Allergy status to penicillin: Secondary | ICD-10-CM | POA: Insufficient documentation

## 2014-06-28 DIAGNOSIS — I319 Disease of pericardium, unspecified: Secondary | ICD-10-CM | POA: Diagnosis not present

## 2014-06-28 DIAGNOSIS — J90 Pleural effusion, not elsewhere classified: Secondary | ICD-10-CM | POA: Insufficient documentation

## 2014-06-28 DIAGNOSIS — Z79899 Other long term (current) drug therapy: Secondary | ICD-10-CM | POA: Insufficient documentation

## 2014-06-28 DIAGNOSIS — J45909 Unspecified asthma, uncomplicated: Secondary | ICD-10-CM | POA: Diagnosis not present

## 2014-06-28 DIAGNOSIS — R079 Chest pain, unspecified: Secondary | ICD-10-CM | POA: Diagnosis not present

## 2014-06-28 DIAGNOSIS — F329 Major depressive disorder, single episode, unspecified: Secondary | ICD-10-CM | POA: Insufficient documentation

## 2014-06-28 DIAGNOSIS — I3139 Other pericardial effusion (noninflammatory): Secondary | ICD-10-CM | POA: Diagnosis present

## 2014-06-28 DIAGNOSIS — E669 Obesity, unspecified: Secondary | ICD-10-CM | POA: Insufficient documentation

## 2014-06-28 DIAGNOSIS — D72829 Elevated white blood cell count, unspecified: Secondary | ICD-10-CM | POA: Diagnosis not present

## 2014-06-28 DIAGNOSIS — I313 Pericardial effusion (noninflammatory): Secondary | ICD-10-CM | POA: Diagnosis not present

## 2014-06-28 DIAGNOSIS — I10 Essential (primary) hypertension: Secondary | ICD-10-CM | POA: Diagnosis not present

## 2014-06-28 DIAGNOSIS — R0602 Shortness of breath: Secondary | ICD-10-CM | POA: Diagnosis present

## 2014-06-28 DIAGNOSIS — Z9889 Other specified postprocedural states: Secondary | ICD-10-CM

## 2014-06-28 DIAGNOSIS — R071 Chest pain on breathing: Secondary | ICD-10-CM

## 2014-06-28 DIAGNOSIS — G35 Multiple sclerosis: Secondary | ICD-10-CM | POA: Diagnosis not present

## 2014-06-28 HISTORY — DX: Pleural effusion, not elsewhere classified: J90

## 2014-06-28 LAB — BASIC METABOLIC PANEL
Anion gap: 16 — ABNORMAL HIGH (ref 5–15)
BUN: 15 mg/dL (ref 6–23)
BUN: 12 mg/dL (ref 6–23)
CALCIUM: 9.1 mg/dL (ref 8.4–10.5)
CHLORIDE: 101 meq/L (ref 96–112)
CO2: 23 meq/L (ref 19–32)
CO2: 21 mEq/L (ref 19–32)
CREATININE: 0.76 mg/dL (ref 0.50–1.10)
Calcium: 8.7 mg/dL (ref 8.4–10.5)
Chloride: 91 mEq/L — ABNORMAL LOW (ref 96–112)
Creatinine, Ser: 0.7 mg/dL (ref 0.50–1.10)
GFR calc Af Amer: >90 mL/min (ref >90)
GLUCOSE: 107 mg/dL — AB (ref 70–99)
Glucose, Bld: 127 mg/dL — ABNORMAL HIGH (ref 70–99)
Potassium: 4.3 mEq/L (ref 3.5–5.3)
Potassium: 4.1 mEq/L (ref 3.7–5.3)
Sodium: 136 mEq/L (ref 135–145)
Sodium: 128 mEq/L — ABNORMAL LOW (ref 137–147)

## 2014-06-28 LAB — PRO B NATRIURETIC PEPTIDE: Pro B Natriuretic peptide (BNP): 1013 pg/mL — ABNORMAL HIGH (ref 0–125)

## 2014-06-28 LAB — CBC
HEMATOCRIT: 32.7 % — AB (ref 36.0–46.0)
HEMOGLOBIN: 10.9 g/dL — AB (ref 12.0–15.0)
MCH: 27.9 pg (ref 26.0–34.0)
MCHC: 33.3 g/dL (ref 30.0–36.0)
MCV: 83.8 fL (ref 78.0–100.0)
Platelets: 297 10*3/uL (ref 150–400)
RBC: 3.9 MIL/uL (ref 3.87–5.11)
RDW: 13.2 % (ref 11.5–15.5)
WBC: 16.7 10*3/uL — ABNORMAL HIGH (ref 4.0–10.5)

## 2014-06-28 LAB — I-STAT TROPONIN, ED: Troponin i, poc: 0 ng/mL (ref 0.00–0.08)

## 2014-06-28 NOTE — ED Notes (Signed)
Pt. reports progressing mid chest pain with SOB and emesis onset 2 days ago , denies diaphoresis . Pain worse with deep inspiration .

## 2014-06-28 NOTE — ED Notes (Signed)
Pt states that she was discharged from the hospital last week after having a "pericardial window" placed to help prevent fluid building up. Pt had pericardiocentesis during her stay as well. Pt states that she was admitted due to cardiac tamponade. Pt states that she became short of breath and started have midsternal chest pain after walking for 10  Minutes today. Pt reports pain is worse with deep inspiration and radiates to her back.

## 2014-06-28 NOTE — ED Provider Notes (Signed)
CSN: [id number]     Arrival date & time 06/28/14  2118 History   First MD Initiated Contact with Patient 06/28/14 2300     Chief Complaint  Patient presents with  . Chest Pain     (Consider location/radiation/quality/duration/timing/severity/associated sxs/prior Treatment) The history is provided by the patient. No language interpreter was used.  [name] is a 53 y/o F with PMHx of asthma, HTN, depression, back pain, MS, cardiac tamponade with pericardiocentesis that occurred on 06/17/2014 presenting to the ED with chest pain, shortness of breath, difficulty breathing. Stated that the chest pain is localized to the center of the chest described as a constant aching sensation with intermittent sharp pain - stated that the pain radiates to her back. Patient reported that she started to have episodes of sweating last night. Patient reported that she has been having nausea and vomiting today - reported at least 3 episodes of emesis today. Stated that she cannot walk for long periods of time without becoming short of breath - reported that when she walks for long periods of time she gets short of breath. Reported that the inspirometer that she uses she noticed that she cannot take deep inhalations as much secondary to the pain. Stated that she has been unable to lay flat secondary to having issues with breathing. Stated that she has a cardiology appointment tomorrow with the PA. Denied leg swelling, travels, chills, cough, nasal congestion, blurred vision, sudden loss of vision, dizziness, syncope. PCP Dr. Drema Dallas  Past Medical History  Diagnosis Date  . Asthma   . Hypertension   . Depression   . Back pain   . Muscle pain   . Obesity   . Uveitis     Bilateral  . MS (multiple sclerosis)   . Cardiac/pericardial tamponade 06/11/2014   Past Surgical History  Procedure Laterality Date  . Uterine ablasion  2012  . Subxyphoid pericardial window N/A 06/16/2014    Procedure: SUBXYPHOID PERICARDIAL  WINDOW;  Surgeon: Ivin Poot, MD;  Location: [facility] OR;  Service: Thoracic;  Laterality: N/A;  . Tee without cardioversion N/A 06/16/2014    Procedure: TRANSESOPHAGEAL ECHOCARDIOGRAM (TEE);  Surgeon: Ivin Poot, MD;  Location: [facility] OR;  Service: Thoracic;  Laterality: N/A;   Family History  Problem Relation Age of Onset  . Breast cancer Mother   . Hypertension Mother   . Diabetes Mother    History  Substance Use Topics  . Smoking status: Never Smoker   . Smokeless tobacco: Never Used  . Alcohol Use: Yes     Comment: occasional   OB History    No data available     Review of Systems  Constitutional: Positive for fever. Negative for chills.  Respiratory: Positive for shortness of breath. Negative for chest tightness.   Cardiovascular: Positive for chest pain.  Gastrointestinal: Positive for nausea and vomiting.  Neurological: Positive for headaches. Negative for dizziness and weakness.      Allergies  Penicillins  Home Medications   Prior to Admission medications   Medication Sig Start Date End Date Taking? Authorizing Provider  colchicine 0.6 MG tablet Take 1 tablet (0.6 mg total) by mouth 2 (two) times daily. 06/27/14  Yes Mihai Croitoru, MD  doxepin (SINEQUAN) 50 MG capsule Take 1 capsule (50 mg total) by mouth at bedtime. 05/08/14  Yes Hulen Luster, DO  escitalopram (LEXAPRO) 10 MG tablet Take 10 mg by mouth daily.   Yes Historical Provider, MD  furosemide (LASIX) 40 MG tablet Take  1 tablet (40 mg total) by mouth daily. 06/27/14  Yes Mihai Croitoru, MD  ibuprofen (ADVIL,MOTRIN) 600 MG tablet Take 1 tablet (600 mg total) by mouth 3 (three) times daily. 06/20/14  Yes Brittainy Erie Noe, PA-C  oxyCODONE (ROXICODONE) 15 MG immediate release tablet Take 1 tablet (15 mg total) by mouth every 4 (four) hours as needed for severe pain. 06/20/14  Yes Brittainy Erie Noe, PA-C  pantoprazole (PROTONIX) 40 MG tablet Take 1 tablet (40 mg total) by mouth daily. 06/20/14  Yes  Brittainy Erie Noe, PA-C  potassium chloride SA (K-DUR,KLOR-CON) 20 MEQ tablet Take 1 tablet (20 mEq total) by mouth daily. 06/27/14  Yes Mihai Croitoru, MD  prednisoLONE acetate (PRED FORTE) 1 % ophthalmic suspension Place 1 drop into both eyes 3 (three) times daily.   Yes Historical Provider, MD  Armodafinil (NUVIGIL) 150 MG tablet Take 1 tablet (150 mg total) by mouth daily. Patient taking differently: Take 150 mg by mouth daily as needed.  05/08/14   Hulen Luster, DO  natalizumab 300 mg in sodium chloride 0.9 % 100 mL Inject 300 mg into the vein every 28 (twenty-eight) days.    Historical Provider, MD   BP 108/62 mmHg  Pulse 104  Temp(Src) 99.8 F (37.7 C) (Oral)  Resp 23  Ht 5\' 10"  (1.778 m)  Wt 260 lb (117.935 kg)  BMI 37.31 kg/m2  SpO2 95% Physical Exam  Constitutional: She is oriented to person, place, and time. She appears well-developed and well-nourished. No distress.  HENT:  Head: Normocephalic and atraumatic.  Mouth/Throat: Oropharynx is clear and moist. No oropharyngeal exudate.  Eyes: Conjunctivae and EOM are normal. Pupils are equal, round, and reactive to light. Right eye exhibits no discharge. Left eye exhibits no discharge.  Neck: Normal range of motion. Neck supple. No tracheal deviation present.  Cardiovascular: Regular rhythm and normal heart sounds.  Exam reveals no friction rub.   No murmur heard. Pulses:      Radial pulses are 2+ on the right side, and 2+ on the left side.       Dorsalis pedis pulses are 2+ on the right side, and 2+ on the left side.  Cap refill < 3 seconds Negative swelling or pitting edema noted to the lower extremities bilaterally  Pulmonary/Chest: Effort normal. No respiratory distress. She has no wheezes. She has no rales. She exhibits tenderness.  Increased shortness of breath - increased effort noted Negative stridor Pain upon palpation to the chest wall   Abdominal: Soft. Bowel sounds are normal. She exhibits no distension.  There is tenderness. There is no rebound and no guarding.    Negative abdominal distension  BS normoactive in all 4 quadrants Mild discomfort upon palpation to the epigastric region  Negative peritoneal signs  Musculoskeletal: Normal range of motion.  Full ROM to upper and lower extremities without difficulty noted, negative ataxia noted.  Lymphadenopathy:    She has no cervical adenopathy.  Neurological: She is alert and oriented to person, place, and time. No cranial nerve deficit. She exhibits normal muscle tone. Coordination normal.  Skin: Skin is warm and dry. No rash noted. She is not diaphoretic. No erythema.  Incision site where pericardiocentesis was performed healing well with negative inflammation, swelling, drainage noted to the center of the chest.   Psychiatric: She has a normal mood and affect. Her behavior is normal. Thought content normal.  Nursing note and vitals reviewed.   ED Course  Procedures (including critical care time)  Results for  orders placed or performed during the hospital encounter of 06/28/14  CBC  Result Value Ref Range   WBC 16.7 (H) 4.0 - 10.5 K/uL   RBC 3.90 3.87 - 5.11 MIL/uL   Hemoglobin 10.9 (L) 12.0 - 15.0 g/dL   HCT 32.7 (L) 36.0 - 46.0 %   MCV 83.8 78.0 - 100.0 fL   MCH 27.9 26.0 - 34.0 pg   MCHC 33.3 30.0 - 36.0 g/dL   RDW 13.2 11.5 - 15.5 %   Platelets 297 150 - 400 K/uL  Basic metabolic panel  Result Value Ref Range   Sodium 128 (L) 137 - 147 mEq/L   Potassium 4.1 3.7 - 5.3 mEq/L   Chloride 91 (L) 96 - 112 mEq/L   CO2 21 19 - 32 mEq/L   Glucose, Bld 127 (H) 70 - 99 mg/dL   BUN 12 6 - 23 mg/dL   Creatinine, Ser 0.70 0.50 - 1.10 mg/dL   Calcium 9.1 8.4 - 10.5 mg/dL   GFR calc non Af Amer >90 >90 mL/min   GFR calc Af Amer >90 >90 mL/min   Anion gap 16 (H) 5 - 15  BNP (order ONLY if patient complains of dyspnea/SOB AND you have documented it for THIS visit)  Result Value Ref Range   Pro B Natriuretic peptide (BNP) 1013.0 (H) 0  - 125 pg/mL  I-stat troponin, ED (not at [facility])  Result Value Ref Range   Troponin i, poc 0.00 0.00 - 0.08 ng/mL   Comment 3            Labs Review Labs Reviewed  CBC - Abnormal; Notable for the following:    WBC 16.7 (*)    Hemoglobin 10.9 (*)    HCT 32.7 (*)    All other components within normal limits  BASIC METABOLIC PANEL - Abnormal; Notable for the following:    Sodium 128 (*)    Chloride 91 (*)    Glucose, Bld 127 (*)    Anion gap 16 (*)    All other components within normal limits  PRO B NATRIURETIC PEPTIDE - Abnormal; Notable for the following:    Pro B Natriuretic peptide (BNP) 1013.0 (*)    All other components within normal limits  I-STAT TROPOININ, ED    Imaging Review Dg Chest 2 View  06/28/2014   CLINICAL DATA:  Vomiting and weakness for 3 days. Pericardial window procedure on November 7th. Symptoms occurring since then.  EXAM: CHEST  2 VIEW  COMPARISON:  06/20/2014  FINDINGS: Mild cardiac enlargement with normal pulmonary vascularity. Small bilateral pleural effusions with basilar atelectasis, greater on the left and increasing since previous study. Interval removal of the right central venous catheter. No pneumothorax. Mild degenerative changes in the spine.  IMPRESSION: Small bilateral pleural effusions and basilar atelectasis, greater on the left.   Electronically Signed   By: Lucienne Capers M.D.   On: 06/28/2014 22:26   Ct Angio Chest Pe W/cm &/or Wo Cm  06/29/2014   CLINICAL DATA:  Chest pain and shortness of breath. Recent pericardial effusion.  EXAM: CT ANGIOGRAPHY CHEST WITH CONTRAST  TECHNIQUE: Multidetector CT imaging of the chest was performed using the standard protocol during bolus administration of intravenous contrast. Multiplanar CT image reconstructions and MIPs were obtained to evaluate the vascular anatomy.  CONTRAST:  189mL OMNIPAQUE IOHEXOL 350 MG/ML SOLN  COMPARISON:  06/14/2014  FINDINGS: Technically adequate study with moderately good  opacification of the central and segmental pulmonary arteries. No focal filling  defects. No evidence of significant pulmonary embolus. Normal heart size. Moderate pericardial effusion. Normal caliber thoracic aorta. No dissection. The great vessel origins are patent. Mild prominence of mediastinal and axillary lymph nodes. Largest lymph node is present in the right paratracheal/pretracheal region and measures 19 mm short axis dimension. This is slightly enlarged since previous study. Lymph nodes are nonspecific and could be reactive nodes although lymphoma or metastasis is not entirely excluded. Esophagus is decompressed.  Moderate bilateral pleural effusions, greater on the left. These are mildly increased since previous study. Atelectasis in the lung bases also greater on the left. Also increased since prior study. No pneumothorax.  Included portions of the upper abdominal organs are unremarkable. No destructive bone lesions. Degenerative changes in the lumbar spine.  Review of the MIP images confirms the above findings.  IMPRESSION: No evidence of significant pulmonary embolus. Again demonstrated is a moderate-sized pericardial effusion. Moderate bilateral pleural effusions and basilar atelectasis, greater on the left and mildly increased since previous study. Increasing mediastinal lymphadenopathy, probably reactive.   Electronically Signed   By: Lucienne Capers M.D.   On: 06/29/2014 01:17     EKG Interpretation   Date/Time:  Wednesday June 28 2014 21:29:01 EST Ventricular Rate:  124 PR Interval:  146 QRS Duration: 86 QT Interval:  286 QTC Calculation: 410 R Axis:   105 Text Interpretation:  Sinus tachycardia Possible Left atrial enlargement  Rightward axis Low voltage QRS Cannot rule out Anterior infarct , age  undetermined Abnormal ECG Confirmed by OTTER  MD, OLGA ([id number]) on  06/28/2014 11:33:39 PM       11:38 PM Patient seen and assessed by attending physician, Dr. Margaretha Seeds. Agreed to  plan of imaging to rule out for PE. Agreed with plan for consulting with cardiology.   12:02 AM This provider spoke with Dr. Claiborne Billings, Cardiologist - discussed case in great detail, history, and recent hospitalization. Cardiology to come and assess patient.   1:06 AM Patient was seen and assessed by Dr. Claiborne Billings, Cardiology Fellow. As per physician, recommended patient to be admitted to [facility] under the care of Cardiology - Dr. Domenic Polite. Reported that patient could possibly have increased fluid accumulation, can also be discomfort from pericarditis. Agreed to CT angiogram of chest to rule out PE. Recommended Fentanyl to be given due to lite blood pressure.   1:58 AM This provider spoke with Dr. Claiborne Billings, Cardiology Fellow - discussed CT angiogram of chest in great detail.   MDM   Final diagnoses:  Pericardial effusion  Pericarditis  S/P pericardiocentesis  Chest pain, unspecified chest pain type    Medications  ondansetron (ZOFRAN-ODT) disintegrating tablet 4 mg (4 mg Oral Given 06/29/14 0031)  fentaNYL (SUBLIMAZE) injection 50 mcg (50 mcg Intravenous Given 06/29/14 0205)  iohexol (OMNIPAQUE) 350 MG/ML injection 100 mL (100 mLs Intravenous Contrast Given 06/29/14 0100)    Filed Vitals:   06/29/14 0030 06/29/14 0136 06/29/14 0145 06/29/14 0200  BP: 99/51 101/57 104/61 108/62  Pulse: 105 101 104 104  Temp:      TempSrc:      Resp: 18 19 24 23   Height:      Weight:      SpO2: 94% 95% 95% 95%   This provider reviewed patient's chart. Patient was last seen and assessed in ED setting on 04/17/2014 regarding chest pain, dyspnea, dizziness-patient was found to have cardiac tamponade with urgent pericardiocentesis performed. Echo performed that noted LVEF 55%. Patient was recently discharged on 06/20/2014.  EKG noted sinus tachycardia  with a heart rate 124 bpm with left atrial enlargement, rightward axis deviation. I-STAT troponin negative elevation. CBC noted mildly elevated white blood cell  count 16.7. BMP noted hyponatremia of 128. BNP elevated at 1013.0. Chest x-ray noted small bilateral pleural effusions and basilar atelectasis, greater on the left - when compared to 06/20/2014 chest xray no pleural effusion was noted. CT angiogram of the chest no evidence of PE. Moderate sized pericardial effusion with moderate bilateral pleural effusions and bilateral atelectasis - left greater than the right. Increasing mediastinal lymphadenopathy that is noted to be reactive.  Patient presenting to the ED with chest pain, increased dyspnea, and orthopnea that has been ongoing since Monday, but has gotten progressively worse. Appears to be pericardial effusion again noted even after pericardial window performed. Negative findings of PE. Patient seen and assessed by Cardiology. Patient to be admitted to [facility] under the care of Cardiology. Discussed plan for admission with patient who agreed to plan of care. Patient stable for transfer.   Jamse Mead, PA-C 06/29/14 [place], MD 06/29/14 [phone]

## 2014-06-29 ENCOUNTER — Emergency Department (HOSPITAL_COMMUNITY): Payer: 59

## 2014-06-29 ENCOUNTER — Encounter (HOSPITAL_COMMUNITY): Payer: Self-pay

## 2014-06-29 ENCOUNTER — Telehealth: Payer: Self-pay | Admitting: Cardiology

## 2014-06-29 ENCOUNTER — Ambulatory Visit: Payer: 59 | Admitting: Cardiology

## 2014-06-29 DIAGNOSIS — I313 Pericardial effusion (noninflammatory): Secondary | ICD-10-CM

## 2014-06-29 DIAGNOSIS — R0602 Shortness of breath: Secondary | ICD-10-CM | POA: Diagnosis present

## 2014-06-29 DIAGNOSIS — I10 Essential (primary) hypertension: Secondary | ICD-10-CM | POA: Diagnosis present

## 2014-06-29 DIAGNOSIS — I319 Disease of pericardium, unspecified: Secondary | ICD-10-CM | POA: Diagnosis present

## 2014-06-29 DIAGNOSIS — R079 Chest pain, unspecified: Secondary | ICD-10-CM

## 2014-06-29 LAB — CBC
HCT: 30.8 % — ABNORMAL LOW (ref 36.0–46.0)
Hemoglobin: 10.3 g/dL — ABNORMAL LOW (ref 12.0–15.0)
MCH: 28.9 pg (ref 26.0–34.0)
MCHC: 33.4 g/dL (ref 30.0–36.0)
MCV: 86.3 fL (ref 78.0–100.0)
Platelets: 259 10*3/uL (ref 150–400)
RBC: 3.57 MIL/uL — ABNORMAL LOW (ref 3.87–5.11)
RDW: 13.3 % (ref 11.5–15.5)
WBC: 11.6 10*3/uL — ABNORMAL HIGH (ref 4.0–10.5)

## 2014-06-29 LAB — URINE MICROSCOPIC-ADD ON

## 2014-06-29 LAB — CREATININE, SERUM
Creatinine, Ser: 0.77 mg/dL (ref 0.50–1.10)
GFR calc Af Amer: >90 mL/min (ref >90)

## 2014-06-29 LAB — URINALYSIS, ROUTINE W REFLEX MICROSCOPIC
BILIRUBIN URINE: NEGATIVE
Glucose, UA: NEGATIVE mg/dL
KETONES UR: NEGATIVE mg/dL
Leukocytes, UA: NEGATIVE
NITRITE: NEGATIVE
PH: 6 (ref 5.0–8.0)
Protein, ur: NEGATIVE mg/dL
Specific Gravity, Urine: 1.032 — ABNORMAL HIGH (ref 1.005–1.030)
Urobilinogen, UA: 1 mg/dL (ref 0.0–1.0)

## 2014-06-29 LAB — TROPONIN I: Troponin I: <0.3 ng/mL (ref <0.30)

## 2014-06-29 MED ORDER — IBUPROFEN 600 MG PO TABS
600.0000 mg | ORAL_TABLET | Freq: Three times a day (TID) | ORAL | Status: DC
  Administered 2014-06-29 – 2014-06-30 (×2): 600 mg via ORAL
  Filled 2014-06-29 (×8): qty 1

## 2014-06-29 MED ORDER — PREDNISOLONE ACETATE 1 % OP SUSP
1.0000 [drp] | Freq: Three times a day (TID) | OPHTHALMIC | Status: DC
  Administered 2014-06-29 – 2014-06-30 (×3): 1 [drp] via OPHTHALMIC
  Filled 2014-06-29: qty 1

## 2014-06-29 MED ORDER — PANTOPRAZOLE SODIUM 40 MG PO TBEC
40.0000 mg | DELAYED_RELEASE_TABLET | Freq: Every day | ORAL | Status: DC
  Administered 2014-06-29 – 2014-06-30 (×2): 40 mg via ORAL
  Filled 2014-06-29 (×2): qty 1

## 2014-06-29 MED ORDER — ACETAMINOPHEN 325 MG PO TABS
650.0000 mg | ORAL_TABLET | ORAL | Status: DC | PRN
  Administered 2014-06-30: 650 mg via ORAL
  Filled 2014-06-29: qty 2

## 2014-06-29 MED ORDER — ONDANSETRON HCL 4 MG/2ML IJ SOLN
4.0000 mg | Freq: Four times a day (QID) | INTRAMUSCULAR | Status: DC | PRN
  Administered 2014-06-29 – 2014-06-30 (×3): 4 mg via INTRAVENOUS
  Filled 2014-06-29 (×3): qty 2

## 2014-06-29 MED ORDER — FENTANYL CITRATE 0.05 MG/ML IJ SOLN
50.0000 ug | Freq: Once | INTRAMUSCULAR | Status: AC
  Administered 2014-06-29: 50 ug via INTRAVENOUS
  Filled 2014-06-29: qty 2

## 2014-06-29 MED ORDER — ESCITALOPRAM OXALATE 10 MG PO TABS
10.0000 mg | ORAL_TABLET | Freq: Every day | ORAL | Status: DC
  Administered 2014-06-29 – 2014-06-30 (×2): 10 mg via ORAL
  Filled 2014-06-29 (×3): qty 1

## 2014-06-29 MED ORDER — MORPHINE SULFATE 2 MG/ML IJ SOLN: 2.0000 mg | Freq: Once | INTRAMUSCULAR | Status: DC

## 2014-06-29 MED ORDER — OXYCODONE HCL 5 MG PO TABS
15.0000 mg | ORAL_TABLET | ORAL | Status: DC | PRN
Start: 2014-06-29 — End: 2014-06-30
  Administered 2014-06-29 – 2014-06-30 (×3): 15 mg via ORAL
  Filled 2014-06-29 (×3): qty 3

## 2014-06-29 MED ORDER — HEPARIN SODIUM (PORCINE) 5000 UNIT/ML IJ SOLN
5000.0000 [IU] | Freq: Three times a day (TID) | INTRAMUSCULAR | Status: DC
  Administered 2014-06-29 – 2014-06-30 (×2): 5000 [IU] via SUBCUTANEOUS
  Filled 2014-06-29 (×5): qty 1

## 2014-06-29 MED ORDER — IOHEXOL 350 MG/ML SOLN
100.0000 mL | Freq: Once | INTRAVENOUS | Status: AC | PRN
  Administered 2014-06-29: 100 mL via INTRAVENOUS

## 2014-06-29 MED ORDER — ONDANSETRON 4 MG PO TBDP
4.0000 mg | ORAL_TABLET | Freq: Once | ORAL | Status: AC
  Administered 2014-06-29: 4 mg via ORAL
  Filled 2014-06-29: qty 1

## 2014-06-29 MED ORDER — FENTANYL CITRATE 0.05 MG/ML IJ SOLN
25.0000 ug | INTRAMUSCULAR | Status: DC | PRN
  Administered 2014-06-29 (×2): 25 ug via INTRAVENOUS
  Filled 2014-06-29 (×2): qty 2

## 2014-06-29 MED ORDER — COLCHICINE 0.6 MG PO TABS
0.6000 mg | ORAL_TABLET | Freq: Two times a day (BID) | ORAL | Status: DC
  Administered 2014-06-29 – 2014-06-30 (×3): 0.6 mg via ORAL
  Filled 2014-06-29 (×4): qty 1

## 2014-06-29 MED ORDER — MORPHINE SULFATE 2 MG/ML IJ SOLN
2.0000 mg | INTRAMUSCULAR | Status: DC | PRN
  Administered 2014-06-29: 2 mg via INTRAVENOUS
  Filled 2014-06-29 (×2): qty 1

## 2014-06-29 MED ORDER — DOXEPIN HCL 50 MG PO CAPS
50.0000 mg | ORAL_CAPSULE | Freq: Every day | ORAL | Status: DC
  Administered 2014-06-29: 50 mg via ORAL
  Filled 2014-06-29 (×2): qty 1

## 2014-06-29 NOTE — Progress Notes (Signed)
  Echocardiogram 2D Echocardiogram has been performed.  Joelene Millin 06/29/2014, 9:55 AM

## 2014-06-29 NOTE — Telephone Encounter (Signed)
Brayton Layman is calling to get some clarity on a prescription that was sent through for the pt. Please call  Thanks

## 2014-06-29 NOTE — ED Notes (Signed)
This RN ordered a heart healthy diet tray for the pt.

## 2014-06-29 NOTE — ED Notes (Signed)
Cardiology at bedside.

## 2014-06-29 NOTE — ED Notes (Signed)
Attempted report 

## 2014-06-29 NOTE — Progress Notes (Signed)
UR completed 

## 2014-06-29 NOTE — ED Notes (Signed)
Patient unable to void at this time

## 2014-06-29 NOTE — ED Notes (Signed)
Per Claiborne Billings, MD pt can eat and drink.

## 2014-06-29 NOTE — H&P (Signed)
[name] is an 53 y.o. female.    Chief Complaint: shortness of breath Primary Cardiologist: Dr. Sallyanne Kuster HPI:  Ms. Teal Raben is a 53 yo woman with PMH of multiple sclerosis, hypertension who initially presented to [facility] 06/11/14 with fatigue, presyncope and dyspnea a week after onset of bronchitis with some associated pleuritic chest pain, cough and nasal symptoms. She ultimately was found to have pericardial tamponade with tachycardia and hypotension and had an emergent fluoroscopic directed, sub-xyphoid pericardiocentesis followed by a pericardial window by Dr. Prescott Gum. She was treated with colchicine, prednisone and NSAIDS and repeat echo 11/08 demonstrated trivial residual pericardial effusion along with preserved EF at 55%. She went home and today, 11/18 was the first day she really did a lot and got out and about and walked for upwards of 10 minutes and had development of shortness of breath and chest pain over her surgical site. She ultimately came to the ER with these symptoms. She continues to have intermittent shortness of breath and the chest tightness has an associated cough. She had a CT chest which revealed a moderate pericardial effusion.   Past Medical History  Diagnosis Date  . Asthma   . Hypertension   . Depression   . Back pain   . Muscle pain   . Obesity   . Uveitis     Bilateral  . MS (multiple sclerosis)   . Cardiac/pericardial tamponade 06/11/2014    Past Surgical History  Procedure Laterality Date  . Uterine ablasion  2012  . Subxyphoid pericardial window N/A 06/16/2014    Procedure: SUBXYPHOID PERICARDIAL WINDOW;  Surgeon: Ivin Poot, MD;  Location: [facility] OR;  Service: Thoracic;  Laterality: N/A;  . Tee without cardioversion N/A 06/16/2014    Procedure: TRANSESOPHAGEAL ECHOCARDIOGRAM (TEE);  Surgeon: Ivin Poot, MD;  Location: [facility] OR;  Service: Thoracic;  Laterality: N/A;    Family History  Problem Relation Age of Onset  . Breast cancer Mother    . Hypertension Mother   . Diabetes Mother    Social History:  reports that she has never smoked. She has never used smokeless tobacco. She reports that she drinks alcohol. She reports that she does not use illicit drugs.  Allergies:  Allergies  Allergen Reactions  . Penicillins Rash     (Not in a hospital admission)  Results for orders placed or performed during the hospital encounter of 06/28/14 (from the past 48 hour(s))  CBC     Status: Abnormal   Collection Time: 06/28/14  9:38 PM  Result Value Ref Range   WBC 16.7 (H) 4.0 - 10.5 K/uL   RBC 3.90 3.87 - 5.11 MIL/uL   Hemoglobin 10.9 (L) 12.0 - 15.0 g/dL   HCT 32.7 (L) 36.0 - 46.0 %   MCV 83.8 78.0 - 100.0 fL   MCH 27.9 26.0 - 34.0 pg   MCHC 33.3 30.0 - 36.0 g/dL   RDW 13.2 11.5 - 15.5 %   Platelets 297 150 - 400 K/uL  Basic metabolic panel     Status: Abnormal   Collection Time: 06/28/14  9:38 PM  Result Value Ref Range   Sodium 128 (L) 137 - 147 mEq/L   Potassium 4.1 3.7 - 5.3 mEq/L   Chloride 91 (L) 96 - 112 mEq/L   CO2 21 19 - 32 mEq/L   Glucose, Bld 127 (H) 70 - 99 mg/dL   BUN 12 6 - 23 mg/dL   Creatinine, Ser 0.70 0.50 - 1.10 mg/dL  Calcium 9.1 8.4 - 10.5 mg/dL   GFR calc non Af Amer >90 >90 mL/min   GFR calc Af Amer >90 >90 mL/min    Comment: (NOTE) The eGFR has been calculated using the CKD EPI equation. This calculation has not been validated in all clinical situations. eGFR's persistently <90 mL/min signify possible Chronic Kidney Disease.    Anion gap 16 (H) 5 - 15  BNP (order ONLY if patient complains of dyspnea/SOB AND you have documented it for THIS visit)     Status: Abnormal   Collection Time: 06/28/14  9:38 PM  Result Value Ref Range   Pro B Natriuretic peptide (BNP) 1013.0 (H) 0 - 125 pg/mL  I-stat troponin, ED (not at [facility])     Status: None   Collection Time: 06/28/14  9:47 PM  Result Value Ref Range   Troponin i, poc 0.00 0.00 - 0.08 ng/mL   Comment 3            Comment: Due to the  release kinetics of cTnI, a negative result within the first hours of the onset of symptoms does not rule out myocardial infarction with certainty. If myocardial infarction is still suspected, repeat the test at appropriate intervals.    Dg Chest 2 View  06/28/2014   CLINICAL DATA:  Vomiting and weakness for 3 days. Pericardial window procedure on November 7th. Symptoms occurring since then.  EXAM: CHEST  2 VIEW  COMPARISON:  06/20/2014  FINDINGS: Mild cardiac enlargement with normal pulmonary vascularity. Small bilateral pleural effusions with basilar atelectasis, greater on the left and increasing since previous study. Interval removal of the right central venous catheter. No pneumothorax. Mild degenerative changes in the spine.  IMPRESSION: Small bilateral pleural effusions and basilar atelectasis, greater on the left.   Electronically Signed   By: Lucienne Capers M.D.   On: 06/28/2014 22:26   Ct Angio Chest Pe W/cm &/or Wo Cm  06/29/2014   CLINICAL DATA:  Chest pain and shortness of breath. Recent pericardial effusion.  EXAM: CT ANGIOGRAPHY CHEST WITH CONTRAST  TECHNIQUE: Multidetector CT imaging of the chest was performed using the standard protocol during bolus administration of intravenous contrast. Multiplanar CT image reconstructions and MIPs were obtained to evaluate the vascular anatomy.  CONTRAST:  169mL OMNIPAQUE IOHEXOL 350 MG/ML SOLN  COMPARISON:  06/14/2014  FINDINGS: Technically adequate study with moderately good opacification of the central and segmental pulmonary arteries. No focal filling defects. No evidence of significant pulmonary embolus. Normal heart size. Moderate pericardial effusion. Normal caliber thoracic aorta. No dissection. The great vessel origins are patent. Mild prominence of mediastinal and axillary lymph nodes. Largest lymph node is present in the right paratracheal/pretracheal region and measures 19 mm short axis dimension. This is slightly enlarged since  previous study. Lymph nodes are nonspecific and could be reactive nodes although lymphoma or metastasis is not entirely excluded. Esophagus is decompressed.  Moderate bilateral pleural effusions, greater on the left. These are mildly increased since previous study. Atelectasis in the lung bases also greater on the left. Also increased since prior study. No pneumothorax.  Included portions of the upper abdominal organs are unremarkable. No destructive bone lesions. Degenerative changes in the lumbar spine.  Review of the MIP images confirms the above findings.  IMPRESSION: No evidence of significant pulmonary embolus. Again demonstrated is a moderate-sized pericardial effusion. Moderate bilateral pleural effusions and basilar atelectasis, greater on the left and mildly increased since previous study. Increasing mediastinal lymphadenopathy, probably reactive.   Electronically Signed  By: Lucienne Capers M.D.   On: 06/29/2014 01:17    Review of Systems  Constitutional: Positive for fever, malaise/fatigue and diaphoresis. Negative for chills.  HENT: Negative for ear pain.   Eyes: Negative for double vision and photophobia.  Respiratory: Positive for cough and shortness of breath. Negative for hemoptysis and sputum production.   Cardiovascular: Positive for chest pain and palpitations.  Gastrointestinal: Positive for nausea and vomiting. Negative for abdominal pain, diarrhea, blood in stool and melena.  Genitourinary: Negative for dysuria and hematuria.  Musculoskeletal: Negative for neck pain.  Skin: Negative for rash.  Neurological: Negative for tingling, tremors, sensory change and headaches.  Endo/Heme/Allergies: Negative for polydipsia. Does not bruise/bleed easily.  Psychiatric/Behavioral: Negative for depression, suicidal ideas and substance abuse.    Blood pressure 101/57, pulse 101, temperature 99.8 F (37.7 C), temperature source Oral, resp. rate 19, height $RemoveBe'5\' 10"'cZSAbWPQc$  (1.778 m), weight 117.935  kg (260 lb), SpO2 95 %. Physical Exam  Nursing note and vitals reviewed. Constitutional: She is oriented to person, place, and time. She appears well-developed and well-nourished. No distress.  HENT:  Head: Normocephalic and atraumatic.  Nose: Nose normal.  Mouth/Throat: Oropharynx is clear and moist. No oropharyngeal exudate.  Eyes: Conjunctivae and EOM are normal. Pupils are equal, round, and reactive to light. No scleral icterus.  Neck: Normal range of motion. Neck supple. No JVD present. No tracheal deviation present.  Cardiovascular: Regular rhythm and intact distal pulses.  Exam reveals gallop.   No murmur heard. tachycardic  Respiratory: No respiratory distress. She has no wheezes. She has rales.  GI: Soft. Bowel sounds are normal. She exhibits no distension. There is no tenderness. There is no rebound.  Musculoskeletal: Normal range of motion. She exhibits no edema or tenderness.  Neurological: She is oriented to person, place, and time. No cranial nerve deficit. Coordination normal.  Skin: Skin is warm and dry. No rash noted. She is not diaphoretic. No erythema.  Psychiatric: She has a normal mood and affect. Her behavior is normal. Thought content normal.   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 Lalisa Kiehn is a 53 yo woman with history of MS, hypertension and recent pericarditis leading to pericardial tamponade with urgent pericardiocentesis and pericardial window who now represents with shortness of breath and intermittent chest pain and found to have recurrent pericardial effusion by chest CT. Differential  diagnosis for her pericardial effusion includes infections (viral, bacteria, Tb/fungal), thyroid disease, rheumatological conditions, malignancy/primary effusion lymphomas among other etiologies. She also may have overdone it today. We will confirm effusion with echocardiogram in AM (early) unless symptoms change. She is hemodynamically stable now. Will defer lasix given low normal blood pressure. Will also continue therapy for her pericarditis.  - repeat echocardiogram in AM unless symptoms change - continue colchicine/NSAIDS for pericarditis  - stepdown, telemetry - trend cardiac biomarkers - blood/urine cultures given leukocytosis  - low threshold for stat echocardiogram - defer lasix given low normal blood pressure  Brailyn Killion 06/29/2014, 1:48 AM

## 2014-06-29 NOTE — Progress Notes (Signed)
Subjective: Does not feel well, + nausea, cannot lie flat with SOB.  + pain.  Objective: Vital signs in last 24 hours: Temp:  [98.1 F (36.7 C)-100.8 F (38.2 C)] 98.1 F (36.7 C) (11/19 0817) Pulse Rate:  [82-123] 82 (11/19 0817) Resp:  [15-46] 20 (11/19 0817) BP: (95-120)/(49-79) 112/62 mmHg (11/19 0817) SpO2:  [92 %-98 %] 97 % (11/19 0817) Weight:  [260 lb (117.935 kg)] 260 lb (117.935 kg) (11/18 2129) Weight change:    Intake/Output from previous day: not noted    Intake/Output this shift:    PE: per MD:  General:Pleasant to flat affect, NAD Skin:Warm and dry, brisk capillary refill HEENT:normocephalic, sclera clear, mucus membranes moist Neck:supple, no JVD Heart:S1S2 RRR without murmur, gallup, rub or click Lungs:clear without rales, rhonchi, or wheezes VHQ:IONG, non tender, + BS, do not palpate liver spleen or masses Ext:no lower ext edema, 2+ pedal pulses, 2+ radial pulses Neuro:alert and oriented X 3, MAE, follows commands, + facial symmetry   Lab Results:  Recent Labs  06/28/14 2138  WBC 16.7*  HGB 10.9*  HCT 32.7*  PLT 297   BMET  Recent Labs  06/27/14 0915 06/28/14 2138  NA 136 128*  K 4.3 4.1  CL 101 91*  CO2 23 21  GLUCOSE 107* 127*  BUN 15 12  CREATININE 0.76 0.70  CALCIUM 8.7 9.1     Studies/Results: Dg Chest 2 View  06/28/2014   CLINICAL DATA:  Vomiting and weakness for 3 days. Pericardial window procedure on November 7th. Symptoms occurring since then.  EXAM: CHEST  2 VIEW  COMPARISON:  06/20/2014  FINDINGS: Mild cardiac enlargement with normal pulmonary vascularity. Small bilateral pleural effusions with basilar atelectasis, greater on the left and increasing since previous study. Interval removal of the right central venous catheter. No pneumothorax. Mild degenerative changes in the spine.  IMPRESSION: Small bilateral pleural effusions and basilar atelectasis, greater on the left.   Electronically Signed   By: Lucienne Capers M.D.   On: 06/28/2014 22:26   Ct Angio Chest Pe W/cm &/or Wo Cm  06/29/2014   CLINICAL DATA:  Chest pain and shortness of breath. Recent pericardial effusion.  EXAM: CT ANGIOGRAPHY CHEST WITH CONTRAST  TECHNIQUE: Multidetector CT imaging of the chest was performed using the standard protocol during bolus administration of intravenous contrast. Multiplanar CT image reconstructions and MIPs were obtained to evaluate the vascular anatomy.  CONTRAST:  166mL OMNIPAQUE IOHEXOL 350 MG/ML SOLN  COMPARISON:  06/14/2014  FINDINGS: Technically adequate study with moderately good opacification of the central and segmental pulmonary arteries. No focal filling defects. No evidence of significant pulmonary embolus. Normal heart size. Moderate pericardial effusion. Normal caliber thoracic aorta. No dissection. The great vessel origins are patent. Mild prominence of mediastinal and axillary lymph nodes. Largest lymph node is present in the right paratracheal/pretracheal region and measures 19 mm short axis dimension. This is slightly enlarged since previous study. Lymph nodes are nonspecific and could be reactive nodes although lymphoma or metastasis is not entirely excluded. Esophagus is decompressed.  Moderate bilateral pleural effusions, greater on the left. These are mildly increased since previous study. Atelectasis in the lung bases also greater on the left. Also increased since prior study. No pneumothorax.  Included portions of the upper abdominal organs are unremarkable. No destructive bone lesions. Degenerative changes in the lumbar spine.  Review of the MIP images confirms the above findings.  IMPRESSION: No evidence of significant pulmonary embolus.  Again demonstrated is a moderate-sized pericardial effusion. Moderate bilateral pleural effusions and basilar atelectasis, greater on the left and mildly increased since previous study. Increasing mediastinal lymphadenopathy, probably reactive.   Electronically  Signed   By: Lucienne Capers M.D.   On: 06/29/2014 01:17    Medications: I have reviewed the patient's current medications. Scheduled Meds: . colchicine  0.6 mg Oral BID  . doxepin  50 mg Oral QHS  . escitalopram  10 mg Oral Daily  . heparin  5,000 Units Subcutaneous 3 times per day  . ibuprofen  600 mg Oral TID  . pantoprazole  40 mg Oral Daily  . prednisoLONE acetate  1 drop Both Eyes TID   Continuous Infusions:  PRN Meds:.acetaminophen, fentaNYL, ondansetron (ZOFRAN) IV, oxyCODONE  Assessment/Plan: 53 yo woman with PMH of multiple sclerosis, hypertension who initially presented to [facility] 06/11/14 with fatigue, presyncope and dyspnea a week after onset of bronchitis with some associated pleuritic chest pain, cough and nasal symptoms. She ultimately was found to have pericardial tamponade with tachycardia and hypotension and had an emergent fluoroscopic directed, sub-xyphoid pericardiocentesis followed by a pericardial window by Dr. Prescott Gum. She was treated with colchicine, prednisone and NSAIDS and repeat echo 11/08 demonstrated trivial residual pericardial effusion along with preserved EF at 55%. She went home and today, 11/18 was the first day she really did a lot and got out and about and walked for upwards of 10 minutes and had development of shortness of breath and chest pain over her surgical site. She ultimately came to the ER with these symptoms. She continues to have intermittent shortness of breath and the chest tightness has an associated cough. She had a CT chest which revealed a moderate pericardial effusion.    Principal Problem:   Shortness of breath  Active Problems:   Multiple sclerosis   Pericardial effusion- per CT scan with symptoms of SOB- just completed her echo. Still with pain will add morphine.   Hypertension- now in the 73A to 193X systolic   Pericarditis- still on colochicine and Ibuprofen   Leukocytosis new    Nausea has zofran ordered   LOS: 1 day     Time spent with pt. : 15 minutes. [facility] R  Nurse Practitioner Certified Pager [phone] or after 5pm and on weekends call [phone] 06/29/2014, 9:51 AM   I have examined the patient and reviewed assessment and plan and discussed with patient.  Agree with above as stated.  Check echo.  Rx for pericarditis.  Kelso Bibby S.

## 2014-06-30 ENCOUNTER — Telehealth: Payer: Self-pay | Admitting: Cardiovascular Disease

## 2014-06-30 ENCOUNTER — Encounter (HOSPITAL_COMMUNITY): Payer: Self-pay | Admitting: Cardiology

## 2014-06-30 DIAGNOSIS — G35 Multiple sclerosis: Secondary | ICD-10-CM

## 2014-06-30 DIAGNOSIS — J90 Pleural effusion, not elsewhere classified: Secondary | ICD-10-CM

## 2014-06-30 HISTORY — DX: Pleural effusion, not elsewhere classified: J90

## 2014-06-30 LAB — GLUCOSE, CAPILLARY: Glucose-Capillary: 104 mg/dL — ABNORMAL HIGH (ref 70–99)

## 2014-06-30 MED ORDER — FUROSEMIDE 10 MG/ML IJ SOLN
40.0000 mg | Freq: Once | INTRAMUSCULAR | Status: AC
  Administered 2014-06-30: 40 mg via INTRAVENOUS

## 2014-06-30 NOTE — Progress Notes (Signed)
Subjective: Feels better.  Wants to go home if possible.  Breathing still somewhat restricted, but better with more pain meds.   Objective: Vital signs in last 24 hours: Temp:  [97.5 F (36.4 C)-98.6 F (37 C)] 98.3 F (36.8 C) (11/20 0700) Pulse Rate:  [75-87] 75 (11/20 0244) Resp:  [15-25] 15 (11/20 0244) BP: (103-114)/(48-71) 113/48 mmHg (11/20 0700) SpO2:  [97 %-100 %] 97 % (11/20 0244) Weight change:    Intake/Output from previous day: not noted 11/19 0701 - 11/20 0700 In: -  Out: 1200 [Urine:1200]  Intake/Output this shift:    PE: per MD:  General:Pleasant to flat affect, NAD Skin:Warm and dry, brisk capillary refill HEENT:normocephalic, sclera clear, mucus membranes moist Neck:supple, no JVD Heart:S1S2 RRR without murmur, gallup, rub or click Lungs:clear without rales, rhonchi, or wheezes WUJ:WJXB, non tender, + BS, do not palpate liver spleen or masses Ext:no lower ext edema, 2+ pedal pulses, 2+ radial pulses Neuro:alert and oriented X 3, MAE, follows commands, + facial symmetry Psych: normal affect  Lab Results:  Recent Labs  06/28/14 2138 06/29/14 0934  WBC 16.7* 11.6*  HGB 10.9* 10.3*  HCT 32.7* 30.8*  PLT 297 259   BMET  Recent Labs  06/28/14 2138 06/29/14 0934  NA 128*  --   K 4.1  --   CL 91*  --   CO2 21  --   GLUCOSE 127*  --   BUN 12  --   CREATININE 0.70 0.77  CALCIUM 9.1  --      Studies/Results: Dg Chest 2 View  06/28/2014   CLINICAL DATA:  Vomiting and weakness for 3 days. Pericardial window procedure on November 7th. Symptoms occurring since then.  EXAM: CHEST  2 VIEW  COMPARISON:  06/20/2014  FINDINGS: Mild cardiac enlargement with normal pulmonary vascularity. Small bilateral pleural effusions with basilar atelectasis, greater on the left and increasing since previous study. Interval removal of the right central venous catheter. No pneumothorax. Mild degenerative changes in the spine.  IMPRESSION: Small bilateral  pleural effusions and basilar atelectasis, greater on the left.   Electronically Signed   By: Lucienne Capers M.D.   On: 06/28/2014 22:26   Ct Angio Chest Pe W/cm &/or Wo Cm  06/29/2014   CLINICAL DATA:  Chest pain and shortness of breath. Recent pericardial effusion.  EXAM: CT ANGIOGRAPHY CHEST WITH CONTRAST  TECHNIQUE: Multidetector CT imaging of the chest was performed using the standard protocol during bolus administration of intravenous contrast. Multiplanar CT image reconstructions and MIPs were obtained to evaluate the vascular anatomy.  CONTRAST:  156mL OMNIPAQUE IOHEXOL 350 MG/ML SOLN  COMPARISON:  06/14/2014  FINDINGS: Technically adequate study with moderately good opacification of the central and segmental pulmonary arteries. No focal filling defects. No evidence of significant pulmonary embolus. Normal heart size. Moderate pericardial effusion. Normal caliber thoracic aorta. No dissection. The great vessel origins are patent. Mild prominence of mediastinal and axillary lymph nodes. Largest lymph node is present in the right paratracheal/pretracheal region and measures 19 mm short axis dimension. This is slightly enlarged since previous study. Lymph nodes are nonspecific and could be reactive nodes although lymphoma or metastasis is not entirely excluded. Esophagus is decompressed.  Moderate bilateral pleural effusions, greater on the left. These are mildly increased since previous study. Atelectasis in the lung bases also greater on the left. Also increased since prior study. No pneumothorax.  Included portions of the upper abdominal organs are unremarkable. No destructive  bone lesions. Degenerative changes in the lumbar spine.  Review of the MIP images confirms the above findings.  IMPRESSION: No evidence of significant pulmonary embolus. Again demonstrated is a moderate-sized pericardial effusion. Moderate bilateral pleural effusions and basilar atelectasis, greater on the left and mildly  increased since previous study. Increasing mediastinal lymphadenopathy, probably reactive.   Electronically Signed   By: Lucienne Capers M.D.   On: 06/29/2014 01:17    Medications: I have reviewed the patient's current medications. Scheduled Meds: . colchicine  0.6 mg Oral BID  . doxepin  50 mg Oral QHS  . escitalopram  10 mg Oral Daily  . heparin  5,000 Units Subcutaneous 3 times per day  . ibuprofen  600 mg Oral TID  . pantoprazole  40 mg Oral Daily  . prednisoLONE acetate  1 drop Both Eyes TID   Continuous Infusions:  PRN Meds:.acetaminophen, fentaNYL, morphine injection, ondansetron (ZOFRAN) IV, oxyCODONE  Assessment/Plan: 53 yo woman with PMH of multiple sclerosis, hypertension who initially presented to [facility] 06/11/14 with fatigue, presyncope and dyspnea a week after onset of bronchitis with some associated pleuritic chest pain, cough and nasal symptoms. She ultimately was found to have pericardial tamponade with tachycardia and hypotension and had an emergent fluoroscopic directed, sub-xyphoid pericardiocentesis followed by a pericardial window by Dr. Prescott Gum. She was treated with colchicine, prednisone and NSAIDS.Marland Kitchen    Principal Problem:   Shortness of breath  Active Problems:   Multiple sclerosis   Pericardial effusion- per CT scan with symptoms of SOB-no tamponade by echo. Still with pain will add morphine.   Hypertension- now in the 29B to 284X systolic   Pericarditis- still on colochicine and Ibuprofen. Small pericardial effusion only. Left pleural effusion present.  Will give IV lasix.  Possible discharge later today if she feels well.  I think pleural effusion can be treated medically at this point.  Will need lasix as an outpatient.    LOS: 2 days   Time spent with pt. : 15 minutes. Laurieann Friddle S.   06/30/2014, 10:07 AM

## 2014-06-30 NOTE — Telephone Encounter (Signed)
Spoke with [facility], Lyda Jester PA-C had ordered meds during hospital stay, pharmacy needed an supervising MD NPI and Name to oversee PA's name. Discharge summary dictated states Dr.C will be primary cardiologist

## 2014-06-30 NOTE — Telephone Encounter (Signed)
TOC pt per laura ingold/mt

## 2014-06-30 NOTE — Discharge Instructions (Signed)
Still take it easy.  Call if problems.  Continue current meds.  Heart Healthy diet.

## 2014-06-30 NOTE — Progress Notes (Signed)
UR completed 

## 2014-06-30 NOTE — Discharge Summary (Signed)
Physician Discharge Summary       Patient ID: [name] MRN: [id number] DOB/AGE: [birth year] 53 y.o.  Admit date: 06/28/2014 Discharge date: 06/30/2014  Discharge Diagnoses:  Principal Problem:   Shortness of breath Active Problems:   Pericarditis   Pericardial effusion   Pleural effusion, bilateral   Multiple sclerosis   Hypertension   Discharged Condition: good  Procedures: none  Primary Cardiologist: Dr. Delila Pereyra Course: 53 yo woman with PMH of multiple sclerosis, hypertension who initially presented to [facility] 06/11/14 with fatigue, presyncope and dyspnea a week after onset of bronchitis with some associated pleuritic chest pain, cough and nasal symptoms. She ultimately was found to have pericardial tamponade with tachycardia and hypotension and had an emergent fluoroscopic directed, sub-xyphoid pericardiocentesis followed by a pericardial window by Dr. Prescott Gum. She was treated with colchicine, prednisone and NSAIDS and repeat echo 11/08 demonstrated trivial residual pericardial effusion along with preserved EF at 55%. She went home and on 11/18 was the first day she really did a lot and got out and about and walked for upwards of 10 minutes and had development of shortness of breath and chest pain over her surgical site. She ultimately came to the ER with these symptoms. She continued to have intermittent shortness of breath and the chest tightness has an associated cough. She had a CT chest which revealed a moderate pericardial effusion. And Pl. Effusions bil.  She was admitted.  She had nausea and increased pain, but with zofran and morphine her symptoms improved and by the day of discharge her po pain meds were controlling the pain in addition to colchicine and ibuprofen.    Her Echo: Compared to the prior echo in 06/18/14, there is now a small circumferential pericardial effusion without tamponade features. This has increased slightly since the prior  echo.  Pt was SOB the AM of discharge and IV lasix given with improvement of her symptoms.  She was managing her pain with PO pain meds.  She was seen by Dr. Irish Lack and found stable for discharge the PM of 06/30/14.  She ambulated and did well.  We discussed that maybe she did overdue the activity.  She will take it easy and we will do early follow up.  She will call if further problems.  She is to see Dr. Prescott Gum next week.  We will have her seen in 7-10 days with our office.  Consults: None  Significant Diagnostic Studies:  BMET    Component Value Date/Time   NA 128* 06/28/2014 2138   NA 141 05/23/2014 1554   K 4.1 06/28/2014 2138   CL 91* 06/28/2014 2138   CO2 21 06/28/2014 2138   GLUCOSE 127* 06/28/2014 2138   GLUCOSE 77 05/23/2014 1554   BUN 12 06/28/2014 2138   BUN 11 05/23/2014 1554   CREATININE 0.77 06/29/2014 0934   CREATININE 0.76 06/27/2014 0915   CALCIUM 9.1 06/28/2014 2138   GFRNONAA >90 06/29/2014 0934   GFRAA >90 06/29/2014 0934    CBC    Component Value Date/Time   WBC 11.6* 06/29/2014 0934   WBC 6.5 05/23/2014 1554   RBC 3.57* 06/29/2014 0934   RBC 3.93 05/23/2014 1554   HGB 10.3* 06/29/2014 0934   HCT 30.8* 06/29/2014 0934   PLT 259 06/29/2014 0934   MCV 86.3 06/29/2014 0934   MCH 28.9 06/29/2014 0934   MCH 29.8 05/23/2014 1554   MCHC 33.4 06/29/2014 0934   MCHC 33.6 05/23/2014 1554   RDW  13.3 06/29/2014 0934   RDW 13.7 05/23/2014 1554   LYMPHSABS 3.0 06/12/2014 0233   LYMPHSABS 2.0 05/23/2014 1554   MONOABS 1.2* 06/12/2014 0233   EOSABS 0.2 06/12/2014 0233   EOSABS 0.2 05/23/2014 1554   BASOSABS 0.0 06/12/2014 0233   BASOSABS 0.0 05/23/2014 1554   Troponin <0.30  U/A trace WBC, nitrates neg.   Follow up Echo:  Study Conclusions - HPI and indications: Limited for Pericardial effusion 423.9. - Left ventricle: The cavity size was normal. Wall thickness was normal. Systolic function was normal. The estimated ejection fraction was in  the range of 55% to 60%. - Inferior vena cava: The vessel was normal in size. The respirophasic diameter changes were in the normal range (>= 50%), consistent with normal central venous pressure. - Pericardium, extracardiac: Small circumferential pericardial effusion. There was a left pleural effusion. Impressions: - Compared to the prior echo in 06/18/14, there is now a small circumferential pericardial effusion without tamponade features. This has increased slightly since the prior echo.   CT SCAN:   IMPRESSION: No evidence of significant pulmonary embolus. Again demonstrated is a moderate-sized pericardial effusion. Moderate bilateral pleural effusions and basilar atelectasis, greater on the left and mildly increased since previous study. Increasing mediastinal lymphadenopathy, probably reactive.  Discharge Exam: Blood pressure 113/48, pulse 75, temperature 97.4 F (36.3 C), temperature source Oral, resp. rate 15, height 5\' 10"  (1.778 m), weight 260 lb (117.935 kg), SpO2 97 %.  Disposition: 01-Home or Self Care     Medication List    TAKE these medications        Armodafinil 150 MG tablet  Commonly known as:  NUVIGIL  Take 1 tablet (150 mg total) by mouth daily.     colchicine 0.6 MG tablet  Take 1 tablet (0.6 mg total) by mouth 2 (two) times daily.     doxepin 50 MG capsule  Commonly known as:  SINEQUAN  Take 1 capsule (50 mg total) by mouth at bedtime.     furosemide 40 MG tablet  Commonly known as:  LASIX  Take 1 tablet (40 mg total) by mouth daily.     ibuprofen 600 MG tablet  Commonly known as:  ADVIL,MOTRIN  Take 1 tablet (600 mg total) by mouth 3 (three) times daily.     LEXAPRO 10 MG tablet  Generic drug:  escitalopram  Take 10 mg by mouth daily.     natalizumab 300 mg in sodium chloride 0.9 % 100 mL  Inject 300 mg into the vein every 28 (twenty-eight) days.     oxyCODONE 15 MG immediate release tablet  Commonly known as:  ROXICODONE   Take 1 tablet (15 mg total) by mouth every 4 (four) hours as needed for severe pain.     pantoprazole 40 MG tablet  Commonly known as:  PROTONIX  Take 1 tablet (40 mg total) by mouth daily.     potassium chloride SA 20 MEQ tablet  Commonly known as:  K-DUR,KLOR-CON  Take 1 tablet (20 mEq total) by mouth daily.     prednisoLONE acetate 1 % ophthalmic suspension  Commonly known as:  PRED FORTE  Place 1 drop into both eyes 3 (three) times daily.          Discharge Instructions: Still take it easy.  Call if problems.  Continue current meds.  Heart Healthy diet.  Signed: Isaiah Serge Nurse Practitioner-Certified [facility]: [facility] 06/30/2014, 2:16 PM  Time spent on discharge : >30 minutes.  I have examined the patient and reviewed assessment and plan and discussed with patient.  Agree with above as stated.  Conintue Rx for pericarditis and furosemide for volume overload.  See my note from earlier in the day.  Onis Markoff S.

## 2014-07-03 ENCOUNTER — Other Ambulatory Visit: Payer: Self-pay | Admitting: Cardiothoracic Surgery

## 2014-07-03 DIAGNOSIS — J90 Pleural effusion, not elsewhere classified: Secondary | ICD-10-CM

## 2014-07-03 NOTE — Telephone Encounter (Signed)
**Note De-Identified  Obfuscation** Patient contacted regarding discharge from [facility] on 06/30/14.  Patient understands to follow up with provider Truitt Merle, NP on 07/10/14 at 9:00 at [facility] in [place], [place] [address]. Patient understands discharge instructions? Yes Patient understands medications and regiment? Yes Patient understands to bring all medications to this visit? Yes  The pt states that she has no complaints at this time and is advised to call the office at [phone] if she has any questions or concerns before her f/u on 07/10/14.

## 2014-07-05 ENCOUNTER — Encounter: Payer: Self-pay | Admitting: Cardiothoracic Surgery

## 2014-07-05 ENCOUNTER — Ambulatory Visit (INDEPENDENT_AMBULATORY_CARE_PROVIDER_SITE_OTHER): Payer: Self-pay | Admitting: Cardiothoracic Surgery

## 2014-07-05 ENCOUNTER — Ambulatory Visit
Admission: RE | Admit: 2014-07-05 | Discharge: 2014-07-05 | Disposition: A | Discharge status: Home / Self Care | Payer: 59 | Source: Ambulatory Visit | Attending: Cardiothoracic Surgery | Admitting: Cardiothoracic Surgery

## 2014-07-05 VITALS — BP 129/87 | HR 73 | Resp 20 | Ht 70.0 in | Wt 260.0 lb

## 2014-07-05 DIAGNOSIS — J90 Pleural effusion, not elsewhere classified: Secondary | ICD-10-CM

## 2014-07-05 DIAGNOSIS — J948 Other specified pleural conditions: Secondary | ICD-10-CM

## 2014-07-05 DIAGNOSIS — I319 Disease of pericardium, unspecified: Secondary | ICD-10-CM

## 2014-07-05 DIAGNOSIS — I3139 Other pericardial effusion (noninflammatory): Secondary | ICD-10-CM

## 2014-07-05 DIAGNOSIS — I313 Pericardial effusion (noninflammatory): Secondary | ICD-10-CM

## 2014-07-05 LAB — CULTURE, BLOOD (ROUTINE X 2)
CULTURE: NO GROWTH
Culture: NO GROWTH

## 2014-07-05 NOTE — Progress Notes (Signed)
PCP is Gerrit Heck, MD Referring Provider is Sherren Mocha, MD  Chief Complaint  Patient presents with  . Routine Post Op    f/u from surgery with CXR, S/P Subxiphoid pericardial window    HPI: Routine followup after his subxiphoid pericardial window for acute pericarditis with effusion 3 weeks ago. The patient was readmitted last week and a followup echo showed only a trivial residual pericardial effusion. Chest x-ray showed a moderate left pleural effusion. She was discharged home on Lasix culture seen and ibuprofen. Her chest pain has resolved. She has a dry cough. She has some decreased exercise tolerance and shortness of breath. The surgical incision is well-healed. She has maintained sinus rhythm.  Chest x-ray today shows a residual moderate left effusion which is slightly larger than a week ago.  Past Medical History  Diagnosis Date  . Asthma   . Hypertension   . Depression   . Back pain   . Muscle pain   . Obesity   . Uveitis     Bilateral  . MS (multiple sclerosis)   . Cardiac/pericardial tamponade 06/11/2014  . Pleural effusion, bilateral 06/30/2014    Past Surgical History  Procedure Laterality Date  . Uterine ablasion  2012  . Subxyphoid pericardial window N/A 06/16/2014    Procedure: SUBXYPHOID PERICARDIAL WINDOW;  Surgeon: Ivin Poot, MD;  Location: [facility] OR;  Service: Thoracic;  Laterality: N/A;  . Tee without cardioversion N/A 06/16/2014    Procedure: TRANSESOPHAGEAL ECHOCARDIOGRAM (TEE);  Surgeon: Ivin Poot, MD;  Location: [facility] OR;  Service: Thoracic;  Laterality: N/A;    Family History  Problem Relation Age of Onset  . Breast cancer Mother   . Hypertension Mother   . Diabetes Mother     Social History History  Substance Use Topics  . Smoking status: Never Smoker   . Smokeless tobacco: Never Used  . Alcohol Use: Yes     Comment: occasional    Current Outpatient Prescriptions  Medication Sig Dispense Refill  . Armodafinil  (NUVIGIL) 150 MG tablet Take 1 tablet (150 mg total) by mouth daily. (Patient taking differently: Take 150 mg by mouth daily as needed. ) 30 tablet 5  . colchicine 0.6 MG tablet Take 1 tablet (0.6 mg total) by mouth 2 (two) times daily. 60 tablet 1  . doxepin (SINEQUAN) 50 MG capsule Take 1 capsule (50 mg total) by mouth at bedtime. 90 capsule 1  . escitalopram (LEXAPRO) 10 MG tablet Take 10 mg by mouth daily.    . furosemide (LASIX) 40 MG tablet Take 1 tablet (40 mg total) by mouth daily. 30 tablet 5  . ibuprofen (ADVIL,MOTRIN) 600 MG tablet Take 1 tablet (600 mg total) by mouth 3 (three) times daily. 90 tablet 1  . natalizumab 300 mg in sodium chloride 0.9 % 100 mL Inject 300 mg into the vein every 28 (twenty-eight) days.    Marland Kitchen oxyCODONE (ROXICODONE) 15 MG immediate release tablet Take 1 tablet (15 mg total) by mouth every 4 (four) hours as needed for severe pain. 30 tablet 0  . pantoprazole (PROTONIX) 40 MG tablet Take 1 tablet (40 mg total) by mouth daily. 30 tablet 5  . potassium chloride SA (K-DUR,KLOR-CON) 20 MEQ tablet Take 1 tablet (20 mEq total) by mouth daily. 30 tablet 5  . prednisoLONE acetate (PRED FORTE) 1 % ophthalmic suspension Place 1 drop into both eyes 3 (three) times daily.     No current facility-administered medications for this visit.    Allergies  Allergen Reactions  . Penicillins Rash    Review of Systemsslowly increasing appetite strength and exercise tolerance   the patient has not returned  to work yet The patient is advised to ambulate for exercise-- not to workout at a fitness center or utilize a  trainer  BP 129/87 mmHg  Pulse 73  Resp 20  Ht 5\' 10"  (1.778 m)  Wt 260 lb (117.935 kg)  BMI 37.31 kg/m2  SpO2  Physical Exam Alert and comfortable Lungs with diminished breath sounds at the left lower base Surgical incision over the xiphoid well-healed Heart rhythm regular without rub or murmur Good pulses  Diagnostic Tests: Chest x-ray shows left  mild-moderate pleural effusion  Impression: Improving after viral pericarditis-pleuritis We will need to follow the left pleural effusion and she may need thoracentesis it does not start to resolve She will return in a week with a chest x-ray.  Plan:return in one week with chest x-ray Continue current medications

## 2014-07-10 ENCOUNTER — Telehealth: Payer: Self-pay | Admitting: Cardiovascular Disease

## 2014-07-10 ENCOUNTER — Encounter: Payer: Self-pay | Admitting: Nurse Practitioner

## 2014-07-10 ENCOUNTER — Ambulatory Visit (INDEPENDENT_AMBULATORY_CARE_PROVIDER_SITE_OTHER): Payer: 59 | Admitting: Nurse Practitioner

## 2014-07-10 VITALS — BP 120/72 | HR 65 | Ht 70.0 in | Wt 256.2 lb

## 2014-07-10 DIAGNOSIS — I313 Pericardial effusion (noninflammatory): Secondary | ICD-10-CM

## 2014-07-10 DIAGNOSIS — I3139 Other pericardial effusion (noninflammatory): Secondary | ICD-10-CM

## 2014-07-10 DIAGNOSIS — J90 Pleural effusion, not elsewhere classified: Secondary | ICD-10-CM

## 2014-07-10 DIAGNOSIS — I319 Disease of pericardium, unspecified: Secondary | ICD-10-CM

## 2014-07-10 DIAGNOSIS — I10 Essential (primary) hypertension: Secondary | ICD-10-CM

## 2014-07-10 LAB — CBC
HCT: 34.5 % — ABNORMAL LOW (ref 36.0–46.0)
Hemoglobin: 11.4 g/dL — ABNORMAL LOW (ref 12.0–15.0)
MCHC: 33.2 g/dL (ref 30.0–36.0)
MCV: 84.3 fl (ref 78.0–100.0)
Platelets: 310 10*3/uL (ref 150.0–400.0)
RBC: 4.09 Mil/uL (ref 3.87–5.11)
RDW: 13.8 % (ref 11.5–15.5)
WBC: 5.2 10*3/uL (ref 4.0–10.5)

## 2014-07-10 LAB — BASIC METABOLIC PANEL
BUN: 14 mg/dL (ref 6–23)
CO2: 24 mEq/L (ref 19–32)
Calcium: 9 mg/dL (ref 8.4–10.5)
Chloride: 104 mEq/L (ref 96–112)
Creatinine, Ser: 0.9 mg/dL (ref 0.4–1.2)
GFR: 74.87 mL/min (ref >60.00)
Glucose, Bld: 84 mg/dL (ref 70–99)
Potassium: 3.8 mEq/L (ref 3.5–5.1)
Sodium: 136 mEq/L (ref 135–145)

## 2014-07-10 NOTE — Patient Instructions (Addendum)
We will be checking the following labs today - BMET and CBC  Stay on your current medicines  Ok to return to work.  We will get you an earlier visit to see Dr. Sallyanne Kuster  Call the [place] office at [phone] if you have any questions, problems or concerns.

## 2014-07-10 NOTE — Progress Notes (Signed)
[name] Date of Birth: 11/17/1960 Medical Record #[id number]  History of Present Illness: Ms. Pruitt is seen back today for a TOC visit. Seen for Dr. Sallyanne Kuster. She is a 53 yo woman with PMH of multiple sclerosis, hypertension and obesity who initially presented to [facility] 06/11/14 with fatigue, presyncope and dyspnea a week after onset of bronchitis with some associated pleuritic chest pain, cough and nasal symptoms. She ultimately was found to have pericardial tamponade with tachycardia and hypotension and had an emergent fluoroscopic directed, sub-xyphoid pericardiocentesis followed by a pericardial window by Dr. Prescott Gum. She was treated with colchicine, prednisone and NSAIDS and repeat echo 11/08 demonstrated trivial residual pericardial effusion along with preserved EF at 55%.  She went home and on 11/18 was the first day she really did a lot and got out and about and walked for upwards of 10 minutes and had development of shortness of breath and chest pain over her surgical site. She ultimately came to the ER with these symptoms. She continued to have intermittent shortness of breath and the chest tightness has an associated cough. She had a CT chest which revealed a moderate pericardial effusion and pleural effusionsl. She was admitted. She had nausea and increased pain, but with zofran and morphine her symptoms improved and by the day of discharge her po pain meds were controlling the pain in addition to colchicine and ibuprofen. She was diuresed and discharged on Lasix.  Comes back today. Here alone. Doing ok. She says she is pretty much back to her baseline. She has gotten a "cold". Saw Dr. Darcey Nora last week - given Zpak. CXR still with effusions/infiltrates/atelectasis. She says her breathing is about 85% improved. Less cough - now dry - was productive. No fever or chills. No chest pain. To return to work today. Remains on her diuretic and colchicine/NSAID.   Current Outpatient  Prescriptions  Medication Sig Dispense Refill  . Armodafinil (NUVIGIL) 150 MG tablet Take 1 tablet (150 mg total) by mouth daily. (Patient taking differently: Take 150 mg by mouth as needed. ) 30 tablet 5  . colchicine 0.6 MG tablet Take 1 tablet (0.6 mg total) by mouth 2 (two) times daily. 60 tablet 1  . doxepin (SINEQUAN) 50 MG capsule Take 1 capsule (50 mg total) by mouth at bedtime. 90 capsule 1  . escitalopram (LEXAPRO) 10 MG tablet Take 10 mg by mouth daily.    . furosemide (LASIX) 40 MG tablet Take 1 tablet (40 mg total) by mouth daily. 30 tablet 5  . ibuprofen (ADVIL,MOTRIN) 600 MG tablet Take 1 tablet (600 mg total) by mouth 3 (three) times daily. 90 tablet 1  . natalizumab 300 mg in sodium chloride 0.9 % 100 mL Inject 300 mg into the vein every 28 (twenty-eight) days.    Marland Kitchen oxyCODONE (ROXICODONE) 15 MG immediate release tablet Take 1 tablet (15 mg total) by mouth every 4 (four) hours as needed for severe pain. 30 tablet 0  . pantoprazole (PROTONIX) 40 MG tablet Take 1 tablet (40 mg total) by mouth daily. 30 tablet 5  . potassium chloride SA (K-DUR,KLOR-CON) 20 MEQ tablet Take 1 tablet (20 mEq total) by mouth daily. 30 tablet 5  . prednisoLONE acetate (PRED FORTE) 1 % ophthalmic suspension Place 1 drop into both eyes 3 (three) times daily.     No current facility-administered medications for this visit.    Allergies  Allergen Reactions  . Penicillins Rash    Past Medical History  Diagnosis Date  .  Asthma   . Hypertension   . Depression   . Back pain   . Muscle pain   . Obesity   . Uveitis     Bilateral  . MS (multiple sclerosis)   . Cardiac/pericardial tamponade 06/11/2014    s/p pericardial window 06/16/2014 per Dr. Darcey Nora  . Pleural effusion, bilateral 06/30/2014    Past Surgical History  Procedure Laterality Date  . Uterine ablasion  2012  . Subxyphoid pericardial window N/A 06/16/2014    Procedure: SUBXYPHOID PERICARDIAL WINDOW;  Surgeon: Ivin Poot, MD;   Location[facility] OR;  Service: Thoracic;  Laterality: N/A;  . Tee without cardioversion N/A 06/16/2014    Procedure: TRANSESOPHAGEAL ECHOCARDIOGRAM (TEE);  Surgeon: Ivin Poot, MD;  Location: [facility] OR;  Service: Thoracic;  Laterality: N/A;    History  Smoking status  . Never Smoker   Smokeless tobacco  . Never Used    History  Alcohol Use  . Yes    Comment: occasional    Family History  Problem Relation Age of Onset  . Breast cancer Mother   . Hypertension Mother   . Diabetes Mother     Review of Systems: The review of systems is per the HPI.  All other systems were reviewed and are negative.  Physical Exam: BP 120/72 mmHg  Pulse 65  Ht 5\' 10"  (1.778 m)  Wt 256 lb 3.2 oz (116.212 kg)  BMI 36.76 kg/m2  SpO2 98% Patient is very pleasant and in no acute distress. She is obese. Skin is warm and dry. Color is normal.  HEENT is unremarkable. Normocephalic/atraumatic. PERRL. Sclera are nonicteric. Neck is supple. No masses. No JVD. Lungs are clear. She has decreased breath sounds in the bases but is moving air. No wheezing noted. Cardiac exam shows a regular rate and rhythm. No rub noted.  Abdomen is soft. Extremities are without edema. Gait and ROM are intact. No gross neurologic deficits noted.  Wt Readings from Last 3 Encounters:  07/10/14 256 lb 3.2 oz (116.212 kg)  07/05/14 260 lb (117.935 kg)  06/28/14 260 lb (117.935 kg)    LABORATORY DATA/PROCEDURES: PENDING  Lab Results  Component Value Date   WBC 11.6* 06/29/2014   HGB 10.3* 06/29/2014   HCT 30.8* 06/29/2014   PLT 259 06/29/2014   GLUCOSE 127* 06/28/2014   ALT 32 06/18/2014   AST 28 06/18/2014   NA 128* 06/28/2014   K 4.1 06/28/2014   CL 91* 06/28/2014   CREATININE 0.77 06/29/2014   BUN 12 06/28/2014   CO2 21 06/28/2014   INR 1.08 06/15/2014    BNP (last 3 results)  Recent Labs  06/11/14 0957 06/28/14 2138  PROBNP 253.1* 1013.0*   Echo Study Conclusions from November 19th, 2015  - HPI and  indications: Limited for Pericardial effusion 423.9. - Left ventricle: The cavity size was normal. Wall thickness was normal. Systolic function was normal. The estimated ejection fraction was in the range of 55% to 60%. - Inferior vena cava: The vessel was normal in size. The respirophasic diameter changes were in the normal range (>= 50%), consistent with normal central venous pressure. - Pericardium, extracardiac: Small circumferential pericardial effusion. There was a left pleural effusion.  Impressions:  - Compared to the prior echo in 06/18/14, there is now a small circumferential pericardial effusion without tamponade features. This has increased slightly since the prior echo.   Echo Study Conclusions from November 18th, 2015  - Left ventricle: The cavity size was normal. Wall thickness  was normal. The estimated ejection fraction was 55%. - Pericardium, extracardiac: Trivial posterior pericardial effusion.    CHEST 2 VIEW  FINDINGS: Mediastinum and hilar structures normal. Bibasilar atelectasis and/or infiltrates with scratched bilateral pleural effusions left side greater than right noted. Heart size stable. Pulmonary vascularity normal. No acute bony abnormality.  IMPRESSION: Bibasilar atelectasis and/or infiltrates with bilateral pleural effusions, left side greater than right.  Electronically Signed  By: Marcello Moores Register  On: 07/05/2014 15:31   Assessment / Plan: 1. Pericardial effusion/tamponade - s/p window - doing well clinically. I have left her on her current regimen. See back in 3 weeks and hope to discontinue her colchicine and NSAID. Ok to return to work.   2. Pleural effusion - persistent - will continue to diurese - recheck labs today.  3. HTN -  BP looks good on current regimen.   4. MS  5. Obesity  Ok to return to work - note was given. She works as a Licensed conveyancer. See back in 3 weeks - continue with her current  medicines for now. Recheck baseline labs today.   Patient is agreeable to this plan and will call if any problems develop in the interim.   Burtis Junes, RN, [place] [address] [place], [place]  [phone]

## 2014-07-10 NOTE — Telephone Encounter (Signed)
11.30.15 Received [employer] Short Term Disability Form via Fax.  Sent letter to patient ([employer] Letter) requesting ROI be completed and payment to [employer] be sent back to get forms completed.  Mailed letter 07/10/14. lp

## 2014-07-11 ENCOUNTER — Other Ambulatory Visit: Payer: Self-pay | Admitting: Neurology

## 2014-07-11 ENCOUNTER — Other Ambulatory Visit: Payer: Self-pay | Admitting: Cardiothoracic Surgery

## 2014-07-11 DIAGNOSIS — G35 Multiple sclerosis: Secondary | ICD-10-CM

## 2014-07-11 DIAGNOSIS — J90 Pleural effusion, not elsewhere classified: Secondary | ICD-10-CM

## 2014-07-12 ENCOUNTER — Encounter: Payer: Self-pay | Admitting: Cardiothoracic Surgery

## 2014-07-12 ENCOUNTER — Ambulatory Visit
Admission: RE | Admit: 2014-07-12 | Discharge: 2014-07-12 | Disposition: A | Discharge status: Home / Self Care | Payer: 59 | Source: Ambulatory Visit | Attending: Cardiothoracic Surgery | Admitting: Cardiothoracic Surgery

## 2014-07-12 ENCOUNTER — Ambulatory Visit (INDEPENDENT_AMBULATORY_CARE_PROVIDER_SITE_OTHER): Payer: 59 | Admitting: Cardiothoracic Surgery

## 2014-07-12 VITALS — BP 140/85 | HR 66 | Resp 18 | Ht 70.0 in | Wt 256.0 lb

## 2014-07-12 DIAGNOSIS — I3139 Other pericardial effusion (noninflammatory): Secondary | ICD-10-CM

## 2014-07-12 DIAGNOSIS — I319 Disease of pericardium, unspecified: Secondary | ICD-10-CM

## 2014-07-12 DIAGNOSIS — J90 Pleural effusion, not elsewhere classified: Secondary | ICD-10-CM

## 2014-07-12 DIAGNOSIS — J948 Other specified pleural conditions: Secondary | ICD-10-CM

## 2014-07-12 DIAGNOSIS — I313 Pericardial effusion (noninflammatory): Secondary | ICD-10-CM

## 2014-07-12 NOTE — Progress Notes (Signed)
PCP is Gerrit Heck, MD Referring Provider is Sherren Mocha, MD  Chief Complaint  Patient presents with  . Routine Post Op    1 week f/u with CXR    HPI:53 year old female returns for followup after subxiphoid pericardial window for acute pericarditis.She was  feeling improved on her last office visit. However she had a mild-moderate left pleural effusion on her chest x-ray.she took a short course of oral Lasix for the effusion. She now feels better and has returned to work. The incision is healing well. No significant chest pain, no shortness of breath   Past Medical History  Diagnosis Date  . Asthma   . Hypertension   . Depression   . Back pain   . Muscle pain   . Obesity   . Uveitis     Bilateral  . MS (multiple sclerosis)   . Cardiac/pericardial tamponade 06/11/2014    s/p pericardial window 06/16/2014 per Dr. Darcey Nora  . Pleural effusion, bilateral 06/30/2014    Past Surgical History  Procedure Laterality Date  . Uterine ablasion  2012  . Subxyphoid pericardial window N/A 06/16/2014    Procedure: SUBXYPHOID PERICARDIAL WINDOW;  Surgeon: Ivin Poot, MD;  Location: [facility] OR;  Service: Thoracic;  Laterality: N/A;  . Tee without cardioversion N/A 06/16/2014    Procedure: TRANSESOPHAGEAL ECHOCARDIOGRAM (TEE);  Surgeon: Ivin Poot, MD;  Location: [facility] OR;  Service: Thoracic;  Laterality: N/A;    Family History  Problem Relation Age of Onset  . Breast cancer Mother   . Hypertension Mother   . Diabetes Mother     Social History History  Substance Use Topics  . Smoking status: Never Smoker   . Smokeless tobacco: Never Used  . Alcohol Use: Yes     Comment: occasional    Current Outpatient Prescriptions  Medication Sig Dispense Refill  . Armodafinil (NUVIGIL) 150 MG tablet Take 1 tablet (150 mg total) by mouth daily. (Patient taking differently: Take 150 mg by mouth as needed. ) 30 tablet 5  . colchicine 0.6 MG tablet Take 1 tablet (0.6 mg total) by  mouth 2 (two) times daily. 60 tablet 1  . doxepin (SINEQUAN) 50 MG capsule Take 1 capsule (50 mg total) by mouth at bedtime. 90 capsule 1  . escitalopram (LEXAPRO) 10 MG tablet Take 10 mg by mouth daily.    . furosemide (LASIX) 40 MG tablet Take 1 tablet (40 mg total) by mouth daily. 30 tablet 5  . ibuprofen (ADVIL,MOTRIN) 600 MG tablet Take 1 tablet (600 mg total) by mouth 3 (three) times daily. 90 tablet 1  . natalizumab 300 mg in sodium chloride 0.9 % 100 mL Inject 300 mg into the vein every 28 (twenty-eight) days.    Marland Kitchen oxyCODONE (ROXICODONE) 15 MG immediate release tablet Take 1 tablet (15 mg total) by mouth every 4 (four) hours as needed for severe pain. 30 tablet 0  . pantoprazole (PROTONIX) 40 MG tablet Take 1 tablet (40 mg total) by mouth daily. 30 tablet 5  . potassium chloride SA (K-DUR,KLOR-CON) 20 MEQ tablet Take 1 tablet (20 mEq total) by mouth daily. 30 tablet 5  . prednisoLONE acetate (PRED FORTE) 1 % ophthalmic suspension Place 1 drop into both eyes 3 (three) times daily.     No current facility-administered medications for this visit.    Allergies  Allergen Reactions  . Penicillins Rash    Review of SystemsBruce strength and exercise tolerance  BP 140/85 mmHg  Pulse 66  Resp 18  Ht 5\' 10"  (1.778 m)  Wt 256 lb (116.121 kg)  BMI 36.73 kg/m2  SpO2 99% Physical Exam Alert and comfortable Lungs clear Heart rate regular without rub Surgical incision healing well No edema  Diagnostic Tests: Chest x-ray shows resolution of left pleural effusion, normal cardiac silhouette  Impression: Good early recovery from acute pericarditis The patient was counseled to avoid strenuous exercise for another 4 weeks. She can do normal daily activity and continue working a  normal schedule  Plan:return as needed

## 2014-07-13 LAB — FUNGUS CULTURE W SMEAR
Fungal Smear: NONE SEEN
Fungal Smear: NONE SEEN

## 2014-07-17 ENCOUNTER — Telehealth: Payer: Self-pay | Admitting: Cardiovascular Disease

## 2014-07-18 ENCOUNTER — Encounter (HOSPITAL_COMMUNITY): Admission: RE | Admit: 2014-07-18 | Payer: 59 | Source: Ambulatory Visit

## 2014-07-18 NOTE — Telephone Encounter (Signed)
Closed encounter °

## 2014-07-19 ENCOUNTER — Encounter (HOSPITAL_COMMUNITY): Payer: 59

## 2014-07-19 ENCOUNTER — Telehealth: Payer: Self-pay | Admitting: Neurology

## 2014-07-19 NOTE — Telephone Encounter (Signed)
I called patient. 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 She is concerned that the Tysabri may have suppressed immune system making her susceptible to this. I have indicated that this is a possibility. The patient may desire to come off of Tysabri, we will consider other options such as Tecfidera. The patient will call me early next week to let me know what her final decision is. If she wants to go back on Tysabri, I will need to put orders in for this medication again.

## 2014-07-19 NOTE — Telephone Encounter (Signed)
I called the patient. I left a message, I'll call back later. The patient was in the hospital for pericardial effusion, the etiology of this is not clear to me. The patient has missed a Tysabri infusion, before the medications reordered, I'll need to speak with her.

## 2014-07-20 ENCOUNTER — Encounter (HOSPITAL_COMMUNITY): Payer: Self-pay | Admitting: Cardiovascular Disease

## 2014-07-20 NOTE — Telephone Encounter (Signed)
12.10.15  Patient brought signed release, pmt and [employer] form to office to process for her [place].  Sent to [facility] @ [place] 12.10.15.  lp

## 2014-07-21 ENCOUNTER — Telehealth: Payer: Self-pay | Admitting: Adult Health

## 2014-07-21 NOTE — Telephone Encounter (Signed)
Rollene Fare @ [employer] is calling stating patient has not had Tysabri since October and wanting to know if she is continuing treatment.  Please call and advise. [place].

## 2014-07-24 ENCOUNTER — Ambulatory Visit (INDEPENDENT_AMBULATORY_CARE_PROVIDER_SITE_OTHER): Payer: 59 | Admitting: Cardiovascular Disease

## 2014-07-24 ENCOUNTER — Ambulatory Visit (HOSPITAL_COMMUNITY)
Admission: RE | Admit: 2014-07-24 | Discharge: 2014-07-24 | Disposition: A | Discharge status: Home / Self Care | Payer: 59 | Source: Ambulatory Visit | Attending: Cardiovascular Disease | Admitting: Cardiovascular Disease

## 2014-07-24 ENCOUNTER — Encounter: Payer: Self-pay | Admitting: Cardiovascular Disease

## 2014-07-24 VITALS — BP 124/80 | HR 89 | Resp 16 | Ht 70.0 in | Wt 250.4 lb

## 2014-07-24 DIAGNOSIS — I309 Acute pericarditis, unspecified: Secondary | ICD-10-CM

## 2014-07-24 DIAGNOSIS — I319 Disease of pericardium, unspecified: Secondary | ICD-10-CM | POA: Diagnosis present

## 2014-07-24 DIAGNOSIS — R0602 Shortness of breath: Secondary | ICD-10-CM

## 2014-07-24 LAB — SEDIMENTATION RATE: SED RATE: 89 mm/h — AB (ref 0–22)

## 2014-07-24 MED ORDER — CELECOXIB 200 MG PO CAPS: 200.0000 mg | ORAL_CAPSULE | Freq: Two times a day (BID) | ORAL | Status: DC

## 2014-07-24 NOTE — Telephone Encounter (Signed)
I called the patient again and let another voicemail on her cell phone.

## 2014-07-24 NOTE — Telephone Encounter (Signed)
According to previous notes the patient had viral pericardial effusion. The patient was concerned that the Tysabri may have suppressed her immune system making her susceptible to this. Dr. Jannifer Franklin gave the patient the option to stop the medication and start something like tecfidera. The patient was going to let Dr. Jannifer Franklin know of her final decision but there is no documentation that the patient called back. I have called the patient to inquire about her decision for future treatment. Once I hear from the patient I will make [facility] [place] aware of her decision.

## 2014-07-24 NOTE — Patient Instructions (Signed)
Your physician recommends that you schedule a follow-up appointment in: 4-6 weeks  Your physician has requested that you have an echocardiogram. Echocardiography is a painless test that uses sound waves to create images of your heart. It provides your doctor with information about the size and shape of your heart and how well your heart's chambers and valves are working. This procedure takes approximately one hour. There are no restrictions for this procedure. Same day as appointment  Your physician has recommended you make the following change in your medication: Stop Motrin and START Celebrex 200 mg daily  Your physician recommends that you return for lab work in: Sed Rate

## 2014-07-24 NOTE — Progress Notes (Signed)
Patient ID: Anarosa Kubisiak, female   DOB: [birth year], 53 y.o.   MRN: [id number]     Reason for office visit Acute pericarditis with pericardial tamponade status post pericardial window  Mrs. Fitzgibbons presented roughly 6 weeks ago with presyncope and dyspnea in the setting of pericardial tamponade on. She ultimately underwent a pericardial window and was discharged on treatment with prednisone, colchicine and ibuprofen. An exact etiology was not established, but a viral syndrome was suspected since she had a roughly one-week prodrome with symptoms of bronchitis. Following the acute event she required brief treatment with diuretics for a pleural effusion, but she has always had normal left ventricular systolic function. Her repeat echocardiogram was performed during a brief hospitalization on November 20 showing a small residual pericardial effusion. She subsequently felt well and is working full time.   Today she presents to the office with a sensation of chest "achiness" but also complains of epigastric sharp pain, which she thinks might be ibuprofen related. She has not had syncope/presyncope, lower extremity edema really any significant shortness of breath (she can climb a flight of stairs without stopping, feels a little winded at the top). He has had a dry nonproductive cough  We performed a limited echocardiogram in the office today and she has a trivial circumferential pericardial effusion, still with normal left ventricular systolic function.   Allergies  Allergen Reactions  . Penicillins Rash    Current Outpatient Prescriptions  Medication Sig Dispense Refill  . Armodafinil (NUVIGIL) 150 MG tablet Take 1 tablet (150 mg total) by mouth daily. (Patient taking differently: Take 150 mg by mouth as needed. ) 30 tablet 5  . colchicine 0.6 MG tablet Take 1 tablet (0.6 mg total) by mouth 2 (two) times daily. 60 tablet 1  . doxepin (SINEQUAN) 50 MG capsule Take 1 capsule (50 mg total) by mouth at  bedtime. 90 capsule 1  . escitalopram (LEXAPRO) 10 MG tablet Take 10 mg by mouth daily.    . furosemide (LASIX) 40 MG tablet Take 1 tablet (40 mg total) by mouth daily. 30 tablet 5  . natalizumab 300 mg in sodium chloride 0.9 % 100 mL Inject 300 mg into the vein every 28 (twenty-eight) days.    . pantoprazole (PROTONIX) 40 MG tablet Take 1 tablet (40 mg total) by mouth daily. 30 tablet 5  . potassium chloride SA (K-DUR,KLOR-CON) 20 MEQ tablet Take 1 tablet (20 mEq total) by mouth daily. 30 tablet 5  . prednisoLONE acetate (PRED FORTE) 1 % ophthalmic suspension Place 1 drop into both eyes 3 (three) times daily.    . celecoxib (CELEBREX) 200 MG capsule Take 1 capsule (200 mg total) by mouth 2 (two) times daily. 30 capsule 6   No current facility-administered medications for this visit.    Past Medical History  Diagnosis Date  . Asthma   . Hypertension   . Depression   . Back pain   . Muscle pain   . Obesity   . Uveitis     Bilateral  . MS (multiple sclerosis)   . Cardiac/pericardial tamponade 06/11/2014    s/p pericardial window 06/16/2014 per Dr. Darcey Nora  . Pleural effusion, bilateral 06/30/2014    Past Surgical History  Procedure Laterality Date  . Uterine ablasion  2012  . Subxyphoid pericardial window N/A 06/16/2014    Procedure: SUBXYPHOID PERICARDIAL WINDOW;  Surgeon: Ivin Poot, MD;  Location: [facility];  Service: Thoracic;  Laterality: N/A;  . Tee without cardioversion N/A 06/16/2014  Procedure: TRANSESOPHAGEAL ECHOCARDIOGRAM (TEE);  Surgeon: Ivin Poot, MD;  Location: [facility] OR;  Service: Thoracic;  Laterality: N/A;  . Pericardial tap N/A 06/11/2014    Procedure: PERICARDIAL TAP;  Surgeon: Blane Ohara, MD;  Location[facility] C[facility] LAB;  Service: Cardiovascular;  Laterality: N/A;    Family History  Problem Relation Age of Onset  . Breast cancer Mother   . Hypertension Mother   . Diabetes Mother     History   Social History  . Marital Status: Single    Spouse  Name: N/A    Number of Children: 0  . Years of Education: MA   Occupational History  . [employer]    Social History Main Topics  . Smoking status: Never Smoker   . Smokeless tobacco: Never Used  . Alcohol Use: Yes     Comment: occasional  . Drug Use: No     Comment: marijuana  . Sexual Activity: Not Currently   Other Topics Concern  . Not on file   Social History Narrative    Review of systems: The patient specifically denies  dyspnea at rest or with exertion, orthopnea, paroxysmal nocturnal dyspnea, syncope, palpitations, focal neurological deficits, intermittent claudication, lower extremity edema, unexplained weight gain, cough, hemoptysis or wheezing.  The patient also denies abdominal pain, nausea, vomiting, dysphagia, diarrhea, constipation, polyuria, polydipsia, dysuria, hematuria, frequency, urgency, abnormal bleeding or bruising, fever, chills, unexpected weight changes, mood swings, change in skin or hair texture, change in voice quality, auditory or visual problems, allergic reactions or rashes, new musculoskeletal complaints other than usual "aches and pains".   PHYSICAL EXAM BP 124/80 mmHg  Pulse 89  Resp 16  Ht $R'5\' 10"'IF$  (1.778 m)  Wt 250 lb 6.4 oz (113.581 kg)  BMI 35.93 kg/m2  General: Alert, oriented x3, no distress Head: no evidence of trauma, PERRL, EOMI, no exophtalmos or lid lag, no myxedema, no xanthelasma; normal ears, nose and oropharynx Neck: normal jugular venous pulsations and no hepatojugular reflux; brisk carotid pulses without delay and no carotid bruits Chest: clear to auscultation, no signs of consolidation by percussion or palpation, normal fremitus, symmetrical and full respiratory excursions Cardiovascular: normal position and quality of the apical impulse, regular rhythm, normal first and second heart sounds, no murmurs, rubs or gallops Abdomen: no tenderness or distention, no masses by palpation, no abnormal pulsatility or  arterial bruits, normal bowel sounds, no hepatosplenomegaly Extremities: no clubbing, cyanosis or edema; 2+ radial, ulnar and brachial pulses bilaterally; 2+ right femoral, posterior tibial and dorsalis pedis pulses; 2+ left femoral, posterior tibial and dorsalis pedis pulses; no subclavian or femoral bruits Neurological: grossly nonfocal   EKG: Normal sinus rhythm, generalized low voltage QRS, probably secondary to obesity, questionable left atrial abnormality  Lipid Panel  No results found for: CHOL, TRIG, HDL, CHOLHDL, VLDL, LDLCALC, LDLDIRECT  BMET    Component Value Date/Time   NA 136 07/10/2014 0944   NA 141 05/23/2014 1554   K 3.8 07/10/2014 0944   CL 104 07/10/2014 0944   CO2 24 07/10/2014 0944   GLUCOSE 84 07/10/2014 0944   GLUCOSE 77 05/23/2014 1554   BUN 14 07/10/2014 0944   BUN 11 05/23/2014 1554   CREATININE 0.9 07/10/2014 0944   CREATININE 0.76 06/27/2014 0915   CALCIUM 9.0 07/10/2014 0944   GFRNONAA >90 06/29/2014 0934   GFRAA >90 06/29/2014 0934     ASSESSMENT AND PLAN  There is no evidence of tamponade on, but there is a very small  circumferential pericardial effusion that is still present. Recommend she continue treatment with colchicine, but I don't think she requires steroids. We'll switch from ibuprofen to celecoxib for better gastric protection, since she seems to be developing some dyspepsia. Continue Protonix. We'll check a sedimentation rate to use as a measure of inflammatory disease activity. Reevaluate in one month.  Orders Placed This Encounter  Procedures  . Sed Rate (ESR)  . EKG 12-Lead  . 2D Echocardiogram without contrast  . 2D Echocardiogram without contrast   Meds ordered this encounter  Medications  . celecoxib (CELEBREX) 200 MG capsule    Sig: Take 1 capsule (200 mg total) by mouth 2 (two) times daily.    Dispense:  30 capsule    Refill:  Bouton Anden Bartolo, MD, [facility] [phone]  office [phone] pager

## 2014-07-24 NOTE — Progress Notes (Signed)
Limited Study complete.  [name], RCS

## 2014-07-24 NOTE — Telephone Encounter (Signed)
Megan, here's one

## 2014-07-25 NOTE — Telephone Encounter (Signed)
Patient returning Julie Blossom, NP call.  Please return call @ [phone].

## 2014-07-25 NOTE — Telephone Encounter (Signed)
I called the patient. She states that she spoke her cardiologist and he does not feel that she has an increased chance of any additional infections due to the Tysabri. The patient is okay with continuing Tysabri but wanted to get Dr. Tobey Grim opinion.   I have since spoken with Dr. Jannifer Franklin and he agrees that Tysabri can be restarted. The patient has tolerated these treatments well in the past. I have tried to call the patient back to make her aware. I left a message for her to call our office

## 2014-07-26 NOTE — Telephone Encounter (Signed)
I called the patient. And explained that she can restart the Tysabri. She states that she would like to restart in January to ensure that she has completely recovered from the pericardial effusion. I will have our [place] to call Rollene Fare with [employer] to set this up.

## 2014-07-26 NOTE — Telephone Encounter (Signed)
Megan, Here's one.

## 2014-07-26 NOTE — Telephone Encounter (Signed)
Patient is returning a call regarding Tysabri.

## 2014-07-26 NOTE — Telephone Encounter (Signed)
I called back.  The number listed is incorrect, it is for a cruise line.  Should be [phone].  I was not able to reach [place].  Left message relaying Megan's note.  Asked her to call us back if anything further is needed.

## 2014-07-27 ENCOUNTER — Telehealth: Payer: Self-pay | Admitting: Cardiovascular Disease

## 2014-07-27 NOTE — Telephone Encounter (Signed)
LM for patient to call concerning her disability papers and let us know when she returned to work so that we can get her papers turned in.  Once paperwork is completed give the [employer] in [place] to fax.

## 2014-07-27 NOTE — Telephone Encounter (Signed)
LM with instruction to call if she is still having chest discomfort.  Sed rate still high and may need another course of steroids.

## 2014-07-27 NOTE — Telephone Encounter (Signed)
Returning your call. °

## 2014-07-29 LAB — AFB CULTURE WITH SMEAR (NOT AT ARMC): ACID FAST SMEAR: NONE SEEN

## 2014-07-30 LAB — AFB CULTURE WITH SMEAR (NOT AT ARMC): Acid Fast Smear: NONE SEEN

## 2014-08-07 ENCOUNTER — Telehealth: Payer: Self-pay | Admitting: Cardiovascular Disease

## 2014-08-07 NOTE — Telephone Encounter (Signed)
She returned to work on 07-10-14.

## 2014-08-07 NOTE — Telephone Encounter (Signed)
12.28.15  Received signed [employer] Short [employer] Disability form back from Dr Sallyanne Kuster.  Faxed to [employer] and mailed original to patient.  lp

## 2014-08-07 NOTE — Telephone Encounter (Signed)
07/10/14 - Form completed and given to [facility] to finalize.

## 2014-08-07 NOTE — Telephone Encounter (Signed)
[facility] with dates requested.

## 2014-08-07 NOTE — Telephone Encounter (Signed)
Noted on disability form and given to [place] to finalize.

## 2014-08-21 ENCOUNTER — Telehealth: Payer: Self-pay | Admitting: Neurology

## 2014-08-21 ENCOUNTER — Other Ambulatory Visit: Payer: Self-pay | Admitting: Neurology

## 2014-08-21 DIAGNOSIS — G35 Multiple sclerosis: Secondary | ICD-10-CM

## 2014-08-21 NOTE — Telephone Encounter (Signed)
Left message that I would have the doctor put in Tysabri orders and for her to call [facility] to set up her appointment.  I will let doctor know that she has she wants to continue with Tysabri.

## 2014-08-21 NOTE — Telephone Encounter (Signed)
I will put orders in for Tysabri. We will need to call was [place] long regarding infusion.

## 2014-08-21 NOTE — Telephone Encounter (Signed)
Pt is calling stating she is ready to start back on Tysabri.  Please put orders in so she can continue treatment.  Please call and advise.

## 2014-08-22 ENCOUNTER — Telehealth: Payer: Self-pay | Admitting: Neurology

## 2014-08-22 NOTE — Telephone Encounter (Signed)
Patient is calling and needs [place] to call the short stay and authorize patient's transfusion.

## 2014-08-23 ENCOUNTER — Ambulatory Visit: Payer: 59 | Admitting: Cardiovascular Disease

## 2014-08-23 NOTE — Telephone Encounter (Signed)
Spoke to patient and relayed that her appointment at [facility] for Tysabri is 09-07-14 at [place].  She will call [facility] if she wishes to get an earlier appointment.

## 2014-08-23 NOTE — Telephone Encounter (Signed)
Spoke to patient and she is scheduled with [facility] for 09-07-14.

## 2014-08-23 NOTE — Telephone Encounter (Signed)
I spoke to [employer] at [facility] and she relayed that before Tysabri can be started they need a note (phone note documentation is fine)  that says it's all right to continue with Tysabri after her procedure for pericardial window and hospital stays.

## 2014-08-23 NOTE — Telephone Encounter (Signed)
I have indicated the patient that it is okay to start the Tysabri again. The patient should initiate treatment soon.

## 2014-08-29 ENCOUNTER — Encounter (HOSPITAL_COMMUNITY): Payer: Self-pay

## 2014-09-07 ENCOUNTER — Encounter (HOSPITAL_COMMUNITY)
Admission: RE | Admit: 2014-09-07 | Discharge: 2014-09-07 | Disposition: A | Discharge status: Home / Self Care | Payer: 59 | Source: Ambulatory Visit | Attending: Neurology | Admitting: Neurology

## 2014-09-07 ENCOUNTER — Encounter: Payer: Self-pay | Admitting: Cardiovascular Disease

## 2014-09-07 ENCOUNTER — Encounter (HOSPITAL_COMMUNITY): Payer: Self-pay

## 2014-09-07 ENCOUNTER — Ambulatory Visit (INDEPENDENT_AMBULATORY_CARE_PROVIDER_SITE_OTHER): Payer: 59 | Admitting: Cardiovascular Disease

## 2014-09-07 ENCOUNTER — Ambulatory Visit (HOSPITAL_COMMUNITY)
Admission: RE | Admit: 2014-09-07 | Discharge: 2014-09-07 | Disposition: A | Discharge status: Home / Self Care | Payer: 59 | Source: Ambulatory Visit | Attending: Cardiovascular Disease | Admitting: Cardiovascular Disease

## 2014-09-07 VITALS — BP 142/81 | HR 74 | Temp 97.5°F | Resp 16 | Ht 70.0 in | Wt 255.0 lb

## 2014-09-07 VITALS — BP 114/72 | HR 72 | Resp 16 | Ht 70.0 in | Wt 261.9 lb

## 2014-09-07 DIAGNOSIS — I313 Pericardial effusion (noninflammatory): Secondary | ICD-10-CM | POA: Insufficient documentation

## 2014-09-07 DIAGNOSIS — G35 Multiple sclerosis: Secondary | ICD-10-CM

## 2014-09-07 DIAGNOSIS — I319 Disease of pericardium, unspecified: Secondary | ICD-10-CM

## 2014-09-07 DIAGNOSIS — I309 Acute pericarditis, unspecified: Secondary | ICD-10-CM

## 2014-09-07 MED ORDER — SODIUM CHLORIDE 0.9 % IV SOLN
300.0000 mg | INTRAVENOUS | Status: DC
  Administered 2014-09-07: 300 mg via INTRAVENOUS
  Filled 2014-09-07: qty 15

## 2014-09-07 MED ORDER — SODIUM CHLORIDE 0.9 % IV SOLN
INTRAVENOUS | Status: DC
  Administered 2014-09-07: 250 mL via INTRAVENOUS

## 2014-09-07 MED ORDER — LORATADINE 10 MG PO TABS
10.0000 mg | ORAL_TABLET | ORAL | Status: DC
  Administered 2014-09-07: 10 mg via ORAL
  Filled 2014-09-07: qty 1

## 2014-09-07 MED ORDER — ACETAMINOPHEN 325 MG PO TABS
650.0000 mg | ORAL_TABLET | ORAL | Status: DC
  Administered 2014-09-07: 650 mg via ORAL
  Filled 2014-09-07: qty 2

## 2014-09-07 NOTE — Patient Instructions (Addendum)
Your physician wants you to follow-up in: 1 Year You will receive a reminder letter in the mail two months in advance. If you don't receive a letter, please call our office to schedule the follow-up appointment.  Your physician has recommended you make the following change in your medication: Stop Celebrex now and Stop Colchicine in 3 months

## 2014-09-07 NOTE — Progress Notes (Signed)
2D Echo Performed 09/07/2014    Marygrace Drought, RCS

## 2014-09-07 NOTE — Discharge Instructions (Signed)
TYSABRI Natalizumab injection What is this medicine? NATALIZUMAB (na ta LIZ you mab) is used to treat relapsing multiple sclerosis. This drug is not a cure. It is also used to treat Crohn's disease. This medicine may be used for other purposes; ask your health care provider or pharmacist if you have questions. COMMON BRAND NAME(S): Tysabri What should I tell my health care provider before I take this medicine? They need to know if you have any of these conditions: -immune system problems -progressive multifocal leukoencephalopathy (PML) -an unusual or allergic reaction to natalizumab, other medicines, foods, dyes, or preservatives -pregnant or trying to get pregnant -breast-feeding How should I use this medicine? This medicine is for infusion into a vein. It is given by a health care professional in a hospital or clinic setting. A special MedGuide will be given to you by the pharmacist with each prescription and refill. Be sure to read this information carefully each time. Talk to your pediatrician regarding the use of this medicine in children. This medicine is not approved for use in children. Overdosage: If you think you have taken too much of this medicine contact a poison control center or emergency room at once. NOTE: This medicine is only for you. Do not share this medicine with others. What if I miss a dose? It is important not to miss your dose. Call your doctor or health care professional if you are unable to keep an appointment. What may interact with this medicine? -azathioprine -cyclosporine -interferon -6-mercaptopurine -methotrexate -steroid medicines like prednisone or cortisone -TNF-alpha inhibitors like adalimumab, etanercept, and infliximab -vaccines This list may not describe all possible interactions. Give your health care provider a list of all the medicines, herbs, non-prescription drugs, or dietary supplements you use. Also tell them if you smoke, drink alcohol,  or use illegal drugs. Some items may interact with your medicine. What should I watch for while using this medicine? Your condition will be monitored carefully while you are receiving this medicine. Visit your doctor for regular check ups. Tell your doctor or healthcare professional if your symptoms do not start to get better or if they get worse. Stay away from people who are sick. Call your doctor or health care professional for advice if you get a fever, chills or sore throat, or other symptoms of a cold or flu. Do not treat yourself. In some patients, this medicine may cause a serious brain infection that may cause death. If you have any problems seeing, thinking, speaking, walking, or standing, tell your doctor right away. If you cannot reach your doctor, get urgent medical care. What side effects may I notice from receiving this medicine? Side effects that you should report to your doctor or health care professional as soon as possible: -allergic reactions like skin rash, itching or hives, swelling of the face, lips, or tongue -breathing problems -changes in vision -chest pain -dark urine -depression, feelings of sadness -dizziness -general ill feeling or flu-like symptoms -irregular, missed, or painful menstrual periods -light-colored stools -loss of appetite, nausea -muscle weakness -problems with balance, talking, or walking -right upper belly pain -unusually weak or tired -yellowing of the eyes or skin Side effects that usually do not require medical attention (report to your doctor or health care professional if they continue or are bothersome): -aches, pains -headache -stomach upset -tiredness This list may not describe all possible side effects. Call your doctor for medical advice about side effects. You may report side effects to FDA at 1-800-FDA-1088. Where should I  keep my medicine? This drug is given in a hospital or clinic and will not be stored at home. NOTE: This  sheet is a summary. It may not cover all possible information. If you have questions about this medicine, talk to your doctor, pharmacist, or health care provider.  2015, Elsevier/Gold Standard. (2008-09-16 13:33:21) Multiple Sclerosis Multiple sclerosis (MS) is a disease of the central nervous system. It leads to the loss of the insulating covering of the nerves (myelin sheath) of your brain. When this happens, brain signals do not get sent properly or may not get sent at all. The age of onset of MS varies.  CAUSES The cause of MS is unknown. However, it is more common in the [place] than in the [place]. RISK FACTORS There is a higher number of women with MS than men. MS is not an illness that is passed down to you from your family members (inherited). However, your risk of MS is higher if you have a relative with MS. SIGNS AND SYMPTOMS  The symptoms of MS occur in episodes or attacks. These attacks may last weeks to months. There may be long periods of almost no symptoms between attacks. The symptoms of MS vary. This is because of the many different ways it affects the central nervous system. The main symptoms of MS include:  Vision problems and eye pain.  Numbness.  Weakness.  Inability to move your arms, hands, feet, or legs (paralysis).  Balance problems.  Tremors. DIAGNOSIS  Your health care provider can diagnose MS with the help of imaging exams and lab tests. These may include specialized X-ray exams and spinal fluid tests. The best imaging exam to confirm a diagnosis of MS is an MRI. TREATMENT  There is no known cure for MS, but there are medicines that can decrease the number and frequency of attacks. Steroids are often used for short-term relief. Physical and occupational therapy may also help. There are also many new alternative or complementary treatments available to help control the symptoms of MS. Ask your health care provider if any of these  other options are right for you. HOME CARE INSTRUCTIONS   Take medicines as directed by your health care provider.  Exercise as directed by your health care provider. SEEK MEDICAL CARE IF: You begin to feel depressed. SEEK IMMEDIATE MEDICAL CARE IF:  You develop paralysis.  You have problems with bladder, bowel, or sexual function.  You develop mental changes, such as forgetfulness or mood swings.  You have a period of uncontrolled movements (seizure). Document Released: 07/25/2000 Document Revised: 08/02/2013 Document Reviewed: 04/04/2013 [facility] Patient Information 2015 [place], [place]. This information is not intended to replace advice given to you by your health care provider. Make sure you discuss any questions you have with your health care provider.

## 2014-09-08 ENCOUNTER — Encounter: Payer: Self-pay | Admitting: Cardiovascular Disease

## 2014-09-08 NOTE — Progress Notes (Signed)
Patient ID: [name], female   DOB: [birth year], 54 y.o.   MRN: [id number]     Reason for office visit Acute pericarditis with pericardial tamponade status post pericardial window  [name] presented roughly 3 months ago with presyncope and dyspnea in the setting of pericardial tamponade. She ultimately underwent a pericardial window and was discharged on treatment with prednisone, colchicine and ibuprofen. An exact etiology was not established, but a viral syndrome was suspected since she had a roughly one-week prodrome with symptoms of bronchitis. Following the acute event she required brief treatment with diuretics for a pleural effusion, but she has always had normal left ventricular systolic function. She now feels well and is working full time.   We performed a limited echocardiogram in the office today and she has virtually complete resolution of the pericardial effusion, still with normal left ventricular systolic function.   Allergies  Allergen Reactions  . Penicillins Rash    Current Outpatient Prescriptions  Medication Sig Dispense Refill  . Armodafinil (NUVIGIL) 150 MG tablet Take 1 tablet (150 mg total) by mouth daily. (Patient taking differently: Take 150 mg by mouth as needed. ) 30 tablet 5  . celecoxib (CELEBREX) 200 MG capsule Take 1 capsule (200 mg total) by mouth 2 (two) times daily. 30 capsule 6  . colchicine 0.6 MG tablet Take 1 tablet (0.6 mg total) by mouth 2 (two) times daily. 60 tablet 1  . doxepin (SINEQUAN) 50 MG capsule Take 1 capsule (50 mg total) by mouth at bedtime. 90 capsule 1  . escitalopram (LEXAPRO) 10 MG tablet Take 10 mg by mouth daily.    Marland Kitchen lisinopril (PRINIVIL,ZESTRIL) 10 MG tablet Take 10 mg by mouth daily.     . natalizumab 300 mg in sodium chloride 0.9 % 100 mL Inject 300 mg into the vein every 28 (twenty-eight) days.    . prednisoLONE acetate (PRED FORTE) 1 % ophthalmic suspension Place 1 drop into both eyes 3 (three) times daily.     No  current facility-administered medications for this visit.    Past Medical History  Diagnosis Date  . Asthma   . Hypertension   . Depression   . Back pain   . Muscle pain   . Obesity   . Uveitis     Bilateral  . MS (multiple sclerosis)   . Cardiac/pericardial tamponade 06/11/2014    s/p pericardial window 06/16/2014 per Dr. Darcey Nora  . Pleural effusion, bilateral 06/30/2014    Past Surgical History  Procedure Laterality Date  . Uterine ablasion  2012  . Subxyphoid pericardial window N/A 06/16/2014    Procedure: SUBXYPHOID PERICARDIAL WINDOW;  Surgeon: Ivin Poot, MD;  Location: [facility] OR;  Service: Thoracic;  Laterality: N/A;  . Tee without cardioversion N/A 06/16/2014    Procedure: TRANSESOPHAGEAL ECHOCARDIOGRAM (TEE);  Surgeon: Ivin Poot, MD;  Location: [facility] OR;  Service: Thoracic;  Laterality: N/A;  . Pericardial tap N/A 06/11/2014    Procedure: PERICARDIAL TAP;  Surgeon: Blane Ohara, MD;  Location: [facility] CATH LAB;  Service: Cardiovascular;  Laterality: N/A;    Family History  Problem Relation Age of Onset  . Breast cancer Mother   . Hypertension Mother   . Diabetes Mother     History   Social History  . Marital Status: Single    Spouse Name: N/A    Number of Children: 0  . Years of Education: MA   Occupational History  . [employer]    Social History  Main Topics  . Smoking status: Never Smoker   . Smokeless tobacco: Never Used  . Alcohol Use: Yes     Comment: occasional  . Drug Use: No     Comment: marijuana  . Sexual Activity: Not Currently   Other Topics Concern  . Not on file   Social History Narrative    Review of systems: The patient specifically denies any chest pain at rest or with exertion, dyspnea at rest or with exertion, orthopnea, paroxysmal nocturnal dyspnea, syncope, palpitations, focal neurological deficits, intermittent claudication, lower extremity edema, unexplained weight gain, cough, hemoptysis or  wheezing.  The patient also denies abdominal pain, nausea, vomiting, dysphagia, diarrhea, constipation, polyuria, polydipsia, dysuria, hematuria, frequency, urgency, abnormal bleeding or bruising, fever, chills, unexpected weight changes, mood swings, change in skin or hair texture, change in voice quality, auditory or visual problems, allergic reactions or rashes, new musculoskeletal complaints other than usual "aches and pains".   PHYSICAL EXAM BP 114/72 mmHg  Pulse 72  Resp 16  Ht 5\' 10"  (1.778 m)  Wt 261 lb 14.4 oz (118.797 kg)  BMI 37.58 kg/m2  General: Alert, oriented x3, no distress Head: no evidence of trauma, PERRL, EOMI, no exophtalmos or lid lag, no myxedema, no xanthelasma; normal ears, nose and oropharynx Neck: normal jugular venous pulsations and no hepatojugular reflux; brisk carotid pulses without delay and no carotid bruits Chest: clear to auscultation, no signs of consolidation by percussion or palpation, normal fremitus, symmetrical and full respiratory excursions Cardiovascular: normal position and quality of the apical impulse, regular rhythm, normal first and second heart sounds, no murmurs, rubs or gallops Abdomen: no tenderness or distention, no masses by palpation, no abnormal pulsatility or arterial bruits, normal bowel sounds, no hepatosplenomegaly Extremities: no clubbing, cyanosis or edema; 2+ radial, ulnar and brachial pulses bilaterally; 2+ right femoral, posterior tibial and dorsalis pedis pulses; 2+ left femoral, posterior tibial and dorsalis pedis pulses; no subclavian or femoral bruits Neurological: grossly nonfocal  Normal CBC, LFTs and creat and TSH recently  BMET    Component Value Date/Time   NA 136 07/10/2014 0944   NA 141 05/23/2014 1554   K 3.8 07/10/2014 0944   CL 104 07/10/2014 0944   CO2 24 07/10/2014 0944   GLUCOSE 84 07/10/2014 0944   GLUCOSE 77 05/23/2014 1554   BUN 14 07/10/2014 0944   BUN 11 05/23/2014 1554   CREATININE 0.9  07/10/2014 0944   CREATININE 0.76 06/27/2014 0915   CALCIUM 9.0 07/10/2014 0944   GFRNONAA >90 06/29/2014 0934   GFRAA >90 06/29/2014 0934     ASSESSMENT AND PLAN  Resolved acute pericarditis, presumed viral etiology. Reduce colchicine to once daily and stop after 3 months. Follow up in one year.   Meds ordered this encounter  Medications  . lisinopril (PRINIVIL,ZESTRIL) 10 MG tablet    Sig: Take 10 mg by mouth daily.     Holli Humbles, MD, [place] [phone] office [phone] pager

## 2014-09-13 ENCOUNTER — Encounter: Payer: Self-pay | Admitting: Cardiovascular Disease

## 2014-10-09 ENCOUNTER — Encounter (HOSPITAL_COMMUNITY)
Admission: RE | Admit: 2014-10-09 | Discharge: 2014-10-09 | Disposition: A | Discharge status: Home / Self Care | Payer: 59 | Source: Ambulatory Visit | Attending: Neurology | Admitting: Neurology

## 2014-10-09 VITALS — BP 132/81 | HR 58 | Temp 97.8°F | Resp 16 | Ht 70.0 in | Wt 264.2 lb

## 2014-10-09 DIAGNOSIS — G35 Multiple sclerosis: Secondary | ICD-10-CM

## 2014-10-09 MED ORDER — ACETAMINOPHEN 325 MG PO TABS
650.0000 mg | ORAL_TABLET | ORAL | Status: AC
  Administered 2014-10-09: 650 mg via ORAL
  Filled 2014-10-09: qty 2

## 2014-10-09 MED ORDER — LORATADINE 10 MG PO TABS
10.0000 mg | ORAL_TABLET | ORAL | Status: AC
  Administered 2014-10-09: 10 mg via ORAL
  Filled 2014-10-09: qty 1

## 2014-10-09 MED ORDER — SODIUM CHLORIDE 0.9 % IV SOLN
300.0000 mg | INTRAVENOUS | Status: DC
  Administered 2014-10-09: 300 mg via INTRAVENOUS
  Filled 2014-10-09: qty 15

## 2014-10-09 MED ORDER — LORATADINE 10 MG PO TABS: 10.0000 mg | ORAL_TABLET | ORAL | Status: DC

## 2014-10-09 MED ORDER — ACETAMINOPHEN 325 MG PO TABS: 650.0000 mg | ORAL_TABLET | ORAL | Status: DC

## 2014-10-09 MED ORDER — SODIUM CHLORIDE 0.9 % IV SOLN
INTRAVENOUS | Status: AC
  Administered 2014-10-09: 250 mL via INTRAVENOUS

## 2014-10-09 NOTE — Progress Notes (Signed)
Uneventful 2nd infusion of TYSABRI here in short stay. Pt discharged ambulatory unaccompanied to back door of short stay

## 2014-11-06 ENCOUNTER — Encounter (HOSPITAL_COMMUNITY): Admission: RE | Admit: 2014-11-06 | Payer: 59 | Source: Ambulatory Visit

## 2014-11-22 ENCOUNTER — Encounter: Payer: Self-pay | Admitting: Adult Health

## 2014-11-22 ENCOUNTER — Ambulatory Visit (INDEPENDENT_AMBULATORY_CARE_PROVIDER_SITE_OTHER): Payer: 59 | Admitting: Adult Health

## 2014-11-22 VITALS — BP 130/81 | HR 71 | Ht 70.0 in | Wt 263.0 lb

## 2014-11-22 DIAGNOSIS — R202 Paresthesia of skin: Secondary | ICD-10-CM

## 2014-11-22 DIAGNOSIS — Z5181 Encounter for therapeutic drug level monitoring: Secondary | ICD-10-CM | POA: Diagnosis not present

## 2014-11-22 DIAGNOSIS — G35 Multiple sclerosis: Secondary | ICD-10-CM

## 2014-11-22 DIAGNOSIS — H2010 Chronic iridocyclitis, unspecified eye: Secondary | ICD-10-CM

## 2014-11-22 DIAGNOSIS — R200 Anesthesia of skin: Secondary | ICD-10-CM

## 2014-11-22 DIAGNOSIS — M542 Cervicalgia: Secondary | ICD-10-CM

## 2014-11-22 NOTE — Progress Notes (Signed)
[name]: [name] DOB: 1961/01/17  REASON FOR VISIT: follow up- mulitple sclerosis HISTORY FROM: [name]  HISTORY OF PRESENT ILLNESS:  [name] is a 54 year old female with a history of multiple sclerosis. She returns today for follow-up. She is currently receiving tysabri infusions and tolerating it well. At the end of last year she was diagnosed with pericardial effusion but this has since resolved. She did miss some Tysabri treatments during this time. She does report that she is having neck pain. She states in the past she was seen an orthopedic surgeon and received treatment and that resolved her neck pain. She states however recently this has returned. She states that she will have numbness and tingling in the hands when she wakes up in the morning. But denies any numbness or tingling throughout the day. She states over time she feels like there has been some numbness in the fingertips. Denies any changes with her balance or gait. No changes with her bowels or bladder. She does report blurring of the vision due to chronic uveitis. She states that she feels like over time her vision has gotten worse. She was on drops for chronic uveitis however her ophthalmologist recently discontinued this. She states that she is having more issues with her right eye. She states she has floaters as well as blurry vision but denies any pain. She states this has been ongoing. She plans to follow-up with her ophthalmologist. She is currently taking doxepin for sleep and this has worked well however she is been having neck pain that disrupts her sleep at night. The [name] does continue with Nuvigil for fatigue. She states she only takes this on an as-needed basis. She denies any new neurological symptoms. Denies any new medical issues. She returns today for evaluation.  HISTORY 05/23/14:  [name] is a 54 year old female with a history of multiple sclerosis. She returns today for follow-up. She is currently  taking tysabri and tolerating it well. She reports no new numbness or weakness. Denies any changes in her gait or balance. No changes with her bowels or bladder. She does have chronic uveitis bilaterally which causes some blurring of the vision but denies any recent changes. She had cataract surgery several weeks ago. She follows up with her ophthalmologist regularly. She states that she did have an issue with insomnia but Dr. Jannifer Franklin increase the Doxepin and since then it has improved. Fatigue has remained the same. She got her 7th dose of tysabri today.   HISTORY 11/21/13 (CW): a 54 year old right-handed white female with a history of multiple sclerosis. The [name] was initially diagnosed in 2000. The [name] had an episode of left sided optic neuritis in 1996, and then she had left-sided sensory changes in 2000, and a lumbar puncture confirmed the diagnosis of MS. The [name] has had multiple white matter lesions involving the brain and the spinal cord. The [name] has been treated initially with Avonex for one year, and was switched to Rebif until December 2014. The [name] had an exacerbation approximately one year ago associated with a cold sensation on the body and gait problems. The [name] was found to have an enhancing lesion of the brain. The [name] was switched to Tysabri, and she has been on this for a total of 6 doses. The [name] is tolerating the medication well. The [name] has a history of uveitis bilaterally, and some blurring of vision associated with this. The [name] reports no current symptoms of numbness, or weakness or significant  balance issues. The [name] denies problems controlling the bowels or the bladder. The [name] does have some neck discomfort at times. The [name] has just recently moved to this area, and she does not have a primary care physician.  REVIEW OF SYSTEMS: Out of a complete 14 system review of symptoms, the [name] complains only of the following symptoms, and  all other reviewed systems are negative.  Cough, insomnia, snoring, joint pain, neck pain, environmental allergies  ALLERGIES: Allergies  Allergen Reactions  . Penicillins Rash    HOME MEDICATIONS: Outpatient Prescriptions Prior to Visit  Medication Sig Dispense Refill  . Armodafinil (NUVIGIL) 150 MG tablet Take 1 tablet (150 mg total) by mouth daily. ([name] taking differently: Take 150 mg by mouth as needed. ) 30 tablet 5  . celecoxib (CELEBREX) 200 MG capsule Take 1 capsule (200 mg total) by mouth 2 (two) times daily. 30 capsule 6  . colchicine 0.6 MG tablet Take 1 tablet (0.6 mg total) by mouth 2 (two) times daily. 60 tablet 1  . doxepin (SINEQUAN) 50 MG capsule Take 1 capsule (50 mg total) by mouth at bedtime. 90 capsule 1  . escitalopram (LEXAPRO) 10 MG tablet Take 10 mg by mouth daily.    Marland Kitchen lisinopril (PRINIVIL,ZESTRIL) 10 MG tablet Take 10 mg by mouth daily.     . natalizumab 300 mg in sodium chloride 0.9 % 100 mL Inject 300 mg into the vein every 28 (twenty-eight) days.    . prednisoLONE acetate (PRED FORTE) 1 % ophthalmic suspension Place 1 drop into both eyes 3 (three) times daily.     No facility-administered medications prior to visit.    PAST MEDICAL HISTORY: Past Medical History  Diagnosis Date  . Asthma   . Hypertension   . Depression   . Back pain   . Muscle pain   . Obesity   . Uveitis     Bilateral  . MS (multiple sclerosis)   . Cardiac/pericardial tamponade 06/11/2014    s/p pericardial window 06/16/2014 per Dr. Darcey Nora  . Pleural effusion, bilateral 06/30/2014    PAST SURGICAL HISTORY: Past Surgical History  Procedure Laterality Date  . Uterine ablasion  2012  . Subxyphoid pericardial window N/A 06/16/2014    Procedure: SUBXYPHOID PERICARDIAL WINDOW;  Surgeon: Ivin Poot, MD;  Location: [facility] OR;  Service: Thoracic;  Laterality: N/A;  . Tee without cardioversion N/A 06/16/2014    Procedure: TRANSESOPHAGEAL ECHOCARDIOGRAM (TEE);  Surgeon: Ivin Poot, MD;  Location: [facility] OR;  Service: Thoracic;  Laterality: N/A;  . Pericardial tap N/A 06/11/2014    Procedure: PERICARDIAL TAP;  Surgeon: Blane Ohara, MD;  Location: [facility] CATH LAB;  Service: Cardiovascular;  Laterality: N/A;    FAMILY HISTORY: Family History  Problem Relation Age of Onset  . Breast cancer Mother   . Hypertension Mother   . Diabetes Mother     SOCIAL HISTORY: History   Social History  . Marital Status: Single    Spouse Name: N/A  . Number of Children: 0  . Years of Education: MA   Occupational History  . [employer]    Social History Main Topics  . Smoking status: Never Smoker   . Smokeless tobacco: Never Used  . Alcohol Use: Yes     Comment: occasional  . Drug Use: No     Comment: marijuana  . Sexual Activity: Not Currently   Other Topics Concern  . Not on file   Social History Narrative  PHYSICAL EXAM  Filed Vitals:   11/22/14 0902  BP: 130/81  Pulse: 71  Height: 5\' 10"  (1.778 m)  Weight: 263 lb (119.296 kg)   Body mass index is 37.74 kg/(m^2).  Generalized: Well developed, in no acute distress   Neurological examination  Mentation: Alert oriented to time, place, history taking. Follows all commands speech and language fluent Cranial nerve II-XII: Pupils were equal round reactive to light. Extraocular movements were full, visual field were full on confrontational test. Facial sensation and strength were normal. Uvula tongue midline. Head turning and shoulder shrug  were normal and symmetric. Motor: The motor testing reveals 5 over 5 strength of all 4 extremities. Good symmetric motor tone is noted throughout.  Sensory: Sensory testing is intact to soft touch on all 4 extremities. No evidence of extinction is noted.  Coordination: Cerebellar testing reveals good finger-nose-finger and heel-to-shin bilaterally.  Gait and station: Gait is normal. Tandem gait is normal. Romberg is negative. No drift is seen.    Reflexes: Deep tendon reflexes are symmetric and normal bilaterally.    DIAGNOSTIC DATA (LABS, IMAGING, TESTING) - I reviewed [name] records, labs, notes, testing and imaging myself where available.  Lab Results  Component Value Date   WBC 5.2 07/10/2014   HGB 11.4* 07/10/2014   HCT 34.5* 07/10/2014   MCV 84.3 07/10/2014   PLT 310.0 07/10/2014      Component Value Date/Time   NA 136 07/10/2014 0944   NA 141 05/23/2014 1554   K 3.8 07/10/2014 0944   CL 104 07/10/2014 0944   CO2 24 07/10/2014 0944   GLUCOSE 84 07/10/2014 0944   GLUCOSE 77 05/23/2014 1554   BUN 14 07/10/2014 0944   BUN 11 05/23/2014 1554   CREATININE 0.9 07/10/2014 0944   CREATININE 0.76 06/27/2014 0915   CALCIUM 9.0 07/10/2014 0944   PROT 7.0 06/18/2014 0300   PROT 7.0 05/23/2014 1554   ALBUMIN 2.6* 06/18/2014 0300   AST 28 06/18/2014 0300   ALT 32 06/18/2014 0300   ALKPHOS 174* 06/18/2014 0300   BILITOT <0.2* 06/18/2014 0300   GFRNONAA >90 06/29/2014 0934   GFRAA >90 06/29/2014 0934      ASSESSMENT AND PLAN 55 y.o. year old female  has a past medical history of Asthma; Hypertension; Depression; Back pain; Muscle pain; Obesity; Uveitis; MS (multiple sclerosis); Cardiac/pericardial tamponade (06/11/2014); and Pleural effusion, bilateral (06/30/2014). here with:  1. Multiple sclerosis 2. Neck pain 3. Numbness and tingling in the hands most likely related to carpal tunnel syndrome. 4. Visual changes/ chronic uveitis  [name] does report some ongoing neck pain as well as numbness and tingling in the hands in the morning. She also reports some numbness in the fingertips that she feels has occurred gradually. I will check an MRI of the brain and spine for any acute changes and for progression of MS. More than likely the numbness and tingling in her hands could be related to carpal tunnel syndrome since it only occurs in the morning and the [name]'s job requires her to use a computer most of the day. I will  give the [name] a prescription for wrist splints that she can wear at night. She will let me know if this is beneficial. I will also check blood work today. [name] also advised to follow-up with her ophthalmologist regarding ongoing visual changes. [name] advised that if her symptoms worsen or she develops new symptoms she she'll let us know. Otherwise she will follow-up in 3-4 months or sooner if  needed.     Ward Givens, MSN, NP-C 11/22/2014, 9:00 AM [facility] [address] [place], [place] [address] [phone]  Note: This document was prepared with digital dictation and possible smart phrase technology. Any transcriptional errors that result from this process are unintentional.

## 2014-11-22 NOTE — Progress Notes (Signed)
I have read the note, and I agree with the clinical assessment and plan.  WILLIS,CHARLES KEITH   

## 2014-11-22 NOTE — Patient Instructions (Signed)
Continue Tysabri I will check blood work today.  We will recheck MRI brain and cervical spine.  If your symptoms worsen please let us know.

## 2014-11-23 ENCOUNTER — Telehealth: Payer: Self-pay | Admitting: Adult Health

## 2014-11-23 LAB — COMPREHENSIVE METABOLIC PANEL
ALK PHOS: 105 IU/L (ref 39–117)
ALT: 15 IU/L (ref 0–32)
AST: 16 IU/L (ref 0–40)
Albumin/Globulin Ratio: 1.6 (ref 1.1–2.5)
Albumin: 4.1 g/dL (ref 3.5–5.5)
BILIRUBIN TOTAL: 0.3 mg/dL (ref 0.0–1.2)
BUN / CREAT RATIO: 24 — AB (ref 9–23)
BUN: 17 mg/dL (ref 6–24)
CHLORIDE: 103 mmol/L (ref 97–108)
CO2: 26 mmol/L (ref 18–29)
Calcium: 9.3 mg/dL (ref 8.7–10.2)
Creatinine, Ser: 0.72 mg/dL (ref 0.57–1.00)
GFR calc non Af Amer: 97 mL/min/{1.73_m2} (ref >59)
GFR, EST AFRICAN AMERICAN: 112 mL/min/{1.73_m2} (ref >59)
Globulin, Total: 2.6 g/dL (ref 1.5–4.5)
Glucose: 85 mg/dL (ref 65–99)
POTASSIUM: 4.6 mmol/L (ref 3.5–5.2)
SODIUM: 142 mmol/L (ref 134–144)
Total Protein: 6.7 g/dL (ref 6.0–8.5)

## 2014-11-23 LAB — CBC WITH DIFFERENTIAL/PLATELET
BASOS: 1 %
Basophils Absolute: 0.1 10*3/uL (ref 0.0–0.2)
EOS: 3 %
Eosinophils Absolute: 0.2 10*3/uL (ref 0.0–0.4)
HEMATOCRIT: 38.3 % (ref 34.0–46.6)
HEMOGLOBIN: 12.8 g/dL (ref 11.1–15.9)
Immature Grans (Abs): 0 10*3/uL (ref 0.0–0.1)
Immature Granulocytes: 0 %
Lymphocytes Absolute: 3.5 10*3/uL — ABNORMAL HIGH (ref 0.7–3.1)
Lymphs: 50 %
MCH: 29.4 pg (ref 26.6–33.0)
MCHC: 33.4 g/dL (ref 31.5–35.7)
MCV: 88 fL (ref 79–97)
MONOS ABS: 0.6 10*3/uL (ref 0.1–0.9)
Monocytes: 8 %
NEUTROS ABS: 2.7 10*3/uL (ref 1.4–7.0)
Neutrophils Relative %: 38 %
PLATELETS: 197 10*3/uL (ref 150–379)
RBC: 4.35 x10E6/uL (ref 3.77–5.28)
RDW: 15.1 % (ref 12.3–15.4)
WBC: 7.1 10*3/uL (ref 3.4–10.8)

## 2014-11-23 NOTE — Telephone Encounter (Signed)
left message for patient to schedule mri appointments- patient wants scans on separate appointments

## 2014-11-28 NOTE — Progress Notes (Signed)
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 Patient understood. ______

## 2014-11-29 ENCOUNTER — Telehealth: Payer: Self-pay | Admitting: Neurology

## 2014-11-29 NOTE — Telephone Encounter (Signed)
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

## 2014-12-04 ENCOUNTER — Encounter (HOSPITAL_COMMUNITY): Payer: 59

## 2014-12-07 NOTE — Progress Notes (Signed)
Quick Note:  I called and relayed that the JCV antibody test negative. (11-29-14 telephone note per Dr. Jannifer Franklin). Pt verbalized understanding. ______

## 2015-01-01 ENCOUNTER — Encounter (HOSPITAL_COMMUNITY): Payer: 59

## 2015-01-29 ENCOUNTER — Encounter (HOSPITAL_COMMUNITY): Payer: 59

## 2015-02-21 ENCOUNTER — Ambulatory Visit: Payer: 59 | Admitting: Neurology

## 2015-02-21 ENCOUNTER — Telehealth: Payer: Self-pay | Admitting: Neurology

## 2015-02-21 NOTE — Telephone Encounter (Signed)
This patient did not show for a revisit appointment today. 

## 2015-02-26 ENCOUNTER — Encounter (HOSPITAL_COMMUNITY): Payer: 59

## 2015-02-28 ENCOUNTER — Ambulatory Visit (INDEPENDENT_AMBULATORY_CARE_PROVIDER_SITE_OTHER): Payer: 59

## 2015-02-28 ENCOUNTER — Telehealth: Payer: Self-pay

## 2015-02-28 DIAGNOSIS — G35 Multiple sclerosis: Secondary | ICD-10-CM | POA: Diagnosis not present

## 2015-02-28 NOTE — Telephone Encounter (Signed)
Noted. Thanks.

## 2015-02-28 NOTE — Telephone Encounter (Signed)
MRI could not be completed due to anxiety and pain. Patient needed xanax pack Shannon handled process. Xanax given and patient will return this afternoon to complete MRI's . Patient was told she needs a driver. Patient understood process. Patient has follow up with Dr.Willis.

## 2015-03-01 MED ORDER — GADOPENTETATE DIMEGLUMINE 469.01 MG/ML IV SOLN: 20.0000 mL | Freq: Once | INTRAVENOUS | Status: AC | PRN

## 2015-03-08 ENCOUNTER — Ambulatory Visit (INDEPENDENT_AMBULATORY_CARE_PROVIDER_SITE_OTHER): Payer: 59 | Admitting: Neurology

## 2015-03-08 ENCOUNTER — Encounter: Payer: Self-pay | Admitting: Neurology

## 2015-03-08 VITALS — BP 121/84 | HR 82 | Ht 70.0 in | Wt 262.6 lb

## 2015-03-08 DIAGNOSIS — Z5181 Encounter for therapeutic drug level monitoring: Secondary | ICD-10-CM | POA: Diagnosis not present

## 2015-03-08 DIAGNOSIS — G35 Multiple sclerosis: Secondary | ICD-10-CM

## 2015-03-08 DIAGNOSIS — M47812 Spondylosis without myelopathy or radiculopathy, cervical region: Secondary | ICD-10-CM

## 2015-03-08 DIAGNOSIS — R200 Anesthesia of skin: Secondary | ICD-10-CM | POA: Diagnosis not present

## 2015-03-08 MED ORDER — ARMODAFINIL 150 MG PO TABS: 150.0000 mg | ORAL_TABLET | Freq: Every day | ORAL | Status: DC

## 2015-03-08 NOTE — Progress Notes (Signed)
Reason for visit: Multiple sclerosis  [name] is an 54 y.o. female  History of present illness:  [name] is a 54 year old right-handed white female with a history of multiple sclerosis. The patient has been on Tysabri, she is tolerating the medication well. She has developed some intermittent numbness in both hands over the last 3 or 4 months. She will wake up with the numbness in the hands, she may note numbness while driving. She will shake out the hands, and the numbness will improve. She also has some neck and shoulder discomfort that is chronic in nature, this was present last year. She engaged in physical therapy which was helpful, but she was unable to complete the therapy secondary to her pericarditis. The patient denies any other new symptoms such is weakness or gait instability or difficulty controlling the bowels or the bladder. She does report some urgency of the bladder. No new visual complaints have been noted. She just completed MRI evaluation of the brain and cervical spine on 02/28/2015. 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 She has mild spondylosis. The patient reports crepitus in the neck when turning the head. She returns to this office for an evaluation.  Past Medical History  Diagnosis Date  . Asthma   . Hypertension   . Depression   . Back pain   . Muscle pain   . Obesity   . Uveitis     Bilateral  . MS (multiple sclerosis)   . Cardiac/pericardial tamponade 06/11/2014    s/p pericardial window 06/16/2014 per Dr. Darcey Nora  . Pleural effusion, bilateral 06/30/2014    Past Surgical History  Procedure Laterality Date  . Uterine ablasion  2012  . Subxyphoid pericardial window N/A 06/16/2014    Procedure: SUBXYPHOID PERICARDIAL WINDOW;  Surgeon: Ivin Poot, MD;  [facility] OR;  Service: Thoracic;  Laterality: N/A;  . Tee without cardioversion N/A 06/16/2014    Procedure: TRANSESOPHAGEAL  ECHOCARDIOGRAM (TEE);  Surgeon: Ivin Poot, MD;  Location: [facility] OR;  Service: Thoracic;  Laterality: N/A;  . Pericardial tap N/A 06/11/2014    Procedure: PERICARDIAL TAP;  Surgeon: Blane Ohara, MD;  Location: [facility] CATH LAB;  Service: Cardiovascular;  Laterality: N/A;    Family History  Problem Relation Age of Onset  . Breast cancer Mother   . Hypertension Mother   . Diabetes Mother     Social history:  reports that she has never smoked. She has never used smokeless tobacco. She reports that she drinks alcohol. She reports that she does not use illicit drugs.    Allergies  Allergen Reactions  . Penicillins Rash    Medications:  Prior to Admission medications   Medication Sig Start Date End Date Taking? Authorizing Provider  Armodafinil (NUVIGIL) 150 MG tablet Take 1 tablet (150 mg total) by mouth daily. Patient taking differently: Take 150 mg by mouth as needed.  05/08/14  Yes Drema Dallas, DO  doxepin (SINEQUAN) 50 MG capsule Take 1 capsule (50 mg total) by mouth at bedtime. 05/08/14  Yes Drema Dallas, DO  escitalopram (LEXAPRO) 10 MG tablet Take 10 mg by mouth daily.   Yes Historical Provider, MD  ibuprofen (ADVIL,MOTRIN) 200 MG tablet Take 400 mg by mouth every 6 (six) hours as needed.   Yes Historical Provider, MD  lisinopril (PRINIVIL,ZESTRIL) 10 MG tablet Take 10 mg by mouth daily.  09/02/14  Yes Historical Provider, MD  natalizumab 300 mg in sodium chloride 0.9 % 100 mL Inject 300 mg into the vein every 28 (twenty-eight) days.   Yes Historical Provider, MD  prednisoLONE acetate (PRED FORTE) 1 % ophthalmic suspension Place 1 drop into both eyes as needed. 01/17/15  Yes Historical Provider, MD    ROS:  Out of a complete 14 system review of symptoms, the patient complains only of the following symptoms, and all other reviewed systems are negative.  Decreased appetite Environmental allergies Urinary urgency Restless legs, insomnia Neck pain, neck  stiffness Depression  Blood pressure 121/84, pulse 82, height 5\' 10"  (1.778 m), weight 262 lb 9.6 oz (119.115 kg).  Physical Exam  General: The patient is alert and cooperative at the time of the examination. The patient is markedly obese.  Skin: No significant peripheral edema is noted.   Neurologic Exam  Mental status: The patient is alert and oriented x 3 at the time of the examination. The patient has apparent normal recent and remote memory, with an apparently normal attention span and concentration ability.   Cranial nerves: Facial symmetry is present. Speech is normal, no aphasia or dysarthria is noted. Extraocular movements are full. Visual fields are full. Pupils are equal, round, and reactive to light. Discs are flat bilaterally.  Motor: The patient has good strength in all 4 extremities.  Sensory examination: Soft touch sensation is symmetric on the face, arms, and legs. Tinel sign is positive on the right wrist, negative on the left.  Coordination: The patient has good finger-nose-finger and heel-to-shin bilaterally.  Gait and station: The patient has a normal gait. Tandem gait is slightly unsteady. Romberg is negative. No drift is seen.  Reflexes: Deep tendon reflexes are symmetric.   MRI brain 02/28/15:  IMPRESSION: This is an abnormal MRI of the brain with and without contrast showing multiple T2/FLAIR hyperintense foci in the cerebellum, brainstem and bilateral hemispheres in a pattern and configuration consistent with multiple sclerosis. There were no enhancing lesions or acute findings. When compared to an MRI of the brain dated 12/06/2013, there is no interval change.   MRI cervical 02/28/15:  IMPRESSION: Abnormal MRI scan of the cervical spine showing mild spondylitic changes from C3-C6 with mild canal and bilateral foraminal narrowing but without definite compression. Ill-defined spinal cord hyperintensities at C2 and C3-4 may represent remote age chronic  demyelinating plaques. No enhancing lesions are noted.  * MRI scan images were reviewed online. I agree with the written report.    Assessment/Plan:  1. Multiple sclerosis  2. Chronic neuromuscular cervical spine discomfort  3. Intermittent numbness of hands, possible carpal tunnel syndrome  The patient will be given a trial on wrist splints for 6-8 weeks, if the numbness in the hands does not improve, we will consider EMG and nerve conduction study evaluation. The patient will be set up for blood work today for the [employer]. The patient will be set up for physical therapy for neuromuscular therapy on the neck. She will follow-up in 6 months, sooner if needed.   Jill Alexanders MD 03/08/2015 7:52 PM  [facility] [address] [place], [place] [address]  Phone [phone] Fax [phone]

## 2015-03-08 NOTE — Patient Instructions (Addendum)
   We will set you up for physical therapy to have neuromuscular therapy on the neck and shoulders. We will check blood work today on the [place]. You will get wrist splints for the wrists, try this for 6-8 weeks, if no benefit is noted, please call our office and we will get EMG and nerve conduction study evaluation set up.  Multiple Sclerosis Multiple sclerosis (MS) is a disease of the central nervous system. It leads to the loss of the insulating covering of the nerves (myelin sheath) of your brain. When this happens, brain signals do not get sent properly or may not get sent at all. The age of onset of MS varies.  CAUSES The cause of MS is unknown. However, it is more common in the [place] than in the [place]. RISK FACTORS There is a higher number of women with MS than men. MS is not an illness that is passed down to you from your family members (inherited). However, your risk of MS is higher if you have a relative with MS. SIGNS AND SYMPTOMS  The symptoms of MS occur in episodes or attacks. These attacks may last weeks to months. There may be long periods of almost no symptoms between attacks. The symptoms of MS vary. This is because of the many different ways it affects the central nervous system. The main symptoms of MS include:  Vision problems and eye pain.  Numbness.  Weakness.  Inability to move your arms, hands, feet, or legs (paralysis).  Balance problems.  Tremors. DIAGNOSIS  Your health care provider can diagnose MS with the help of imaging exams and lab tests. These may include specialized X-ray exams and spinal fluid tests. The best imaging exam to confirm a diagnosis of MS is an MRI. TREATMENT  There is no known cure for MS, but there are medicines that can decrease the number and frequency of attacks. Steroids are often used for short-term relief. Physical and occupational therapy may also help. There are also many new alternative or  complementary treatments available to help control the symptoms of MS. Ask your health care provider if any of these other options are right for you. HOME CARE INSTRUCTIONS   Take medicines as directed by your health care provider.  Exercise as directed by your health care provider. SEEK MEDICAL CARE IF: You begin to feel depressed. SEEK IMMEDIATE MEDICAL CARE IF:  You develop paralysis.  You have problems with bladder, bowel, or sexual function.  You develop mental changes, such as forgetfulness or mood swings.  You have a period of uncontrolled movements (seizure). Document Released: 07/25/2000 Document Revised: 08/02/2013 Document Reviewed: 04/04/2013 [facility] Patient Information 2015 [place], [place]. This information is not intended to replace advice given to you by your health care provider. Make sure you discuss any questions you have with your health care provider.

## 2015-03-09 LAB — COMPREHENSIVE METABOLIC PANEL
ALT: 22 IU/L (ref 0–32)
AST: 21 IU/L (ref 0–40)
Albumin/Globulin Ratio: 1.6 (ref 1.1–2.5)
Albumin: 4.7 g/dL (ref 3.5–5.5)
Alkaline Phosphatase: 123 IU/L — ABNORMAL HIGH (ref 39–117)
BUN/Creatinine Ratio: 23 (ref 9–23)
BUN: 17 mg/dL (ref 6–24)
Bilirubin Total: 0.4 mg/dL (ref 0.0–1.2)
CALCIUM: 9.9 mg/dL (ref 8.7–10.2)
CHLORIDE: 100 mmol/L (ref 97–108)
CO2: 24 mmol/L (ref 18–29)
CREATININE: 0.75 mg/dL (ref 0.57–1.00)
GFR calc Af Amer: 107 mL/min/{1.73_m2} (ref >59)
GFR calc non Af Amer: 93 mL/min/{1.73_m2} (ref >59)
Globulin, Total: 2.9 g/dL (ref 1.5–4.5)
Glucose: 98 mg/dL (ref 65–99)
Potassium: 5.1 mmol/L (ref 3.5–5.2)
Sodium: 140 mmol/L (ref 134–144)
Total Protein: 7.6 g/dL (ref 6.0–8.5)

## 2015-03-09 LAB — CBC WITH DIFFERENTIAL/PLATELET
Basophils Absolute: 0 10*3/uL (ref 0.0–0.2)
Basos: 0 %
EOS (ABSOLUTE): 0.1 10*3/uL (ref 0.0–0.4)
EOS: 1 %
HEMATOCRIT: 41.8 % (ref 34.0–46.6)
Hemoglobin: 13.8 g/dL (ref 11.1–15.9)
Immature Grans (Abs): 0 10*3/uL (ref 0.0–0.1)
Immature Granulocytes: 0 %
Lymphocytes Absolute: 3.4 10*3/uL — ABNORMAL HIGH (ref 0.7–3.1)
Lymphs: 49 %
MCH: 29.3 pg (ref 26.6–33.0)
MCHC: 33 g/dL (ref 31.5–35.7)
MCV: 89 fL (ref 79–97)
Monocytes Absolute: 0.4 10*3/uL (ref 0.1–0.9)
Monocytes: 6 %
Neutrophils Absolute: 3.1 10*3/uL (ref 1.4–7.0)
Neutrophils: 44 %
Platelets: 228 10*3/uL (ref 150–379)
RBC: 4.71 x10E6/uL (ref 3.77–5.28)
RDW: 13.2 % (ref 12.3–15.4)
WBC: 7.1 10*3/uL (ref 3.4–10.8)

## 2015-04-23 ENCOUNTER — Encounter: Payer: Self-pay | Admitting: Physical Therapy

## 2015-04-23 ENCOUNTER — Ambulatory Visit: Payer: 59 | Attending: Neurology | Admitting: Physical Therapy

## 2015-04-23 DIAGNOSIS — R209 Unspecified disturbances of skin sensation: Secondary | ICD-10-CM | POA: Diagnosis present

## 2015-04-23 DIAGNOSIS — M436 Torticollis: Secondary | ICD-10-CM | POA: Insufficient documentation

## 2015-04-23 DIAGNOSIS — M542 Cervicalgia: Secondary | ICD-10-CM | POA: Insufficient documentation

## 2015-04-23 DIAGNOSIS — R293 Abnormal posture: Secondary | ICD-10-CM | POA: Insufficient documentation

## 2015-04-23 NOTE — Therapy (Signed)
[facility] [address] [place], [place], [address] Phone: [phone]   Fax:  [phone]  Physical Therapy Evaluation  Patient Details  Name: [name] MRN: [id number] Date of Birth: [birth year] Referring Provider:  Kathrynn Ducking, MD  Encounter Date: 04/23/2015      PT End of Session - 04/23/15 1046    Visit Number 1   Number of Visits 13   Date for PT Re-Evaluation 06/22/15   Authorization Type [employer]   PT Start Time 0807   PT Stop Time 0850   PT Time Calculation (min) 43 min   Activity Tolerance Patient tolerated treatment well;No increased pain   Behavior During Therapy [facility] for tasks assessed/performed      Past Medical History  Diagnosis Date  . Asthma   . Hypertension   . Depression   . Back pain   . Muscle pain   . Obesity   . Uveitis     Bilateral  . MS (multiple sclerosis)   . Cardiac/pericardial tamponade 06/11/2014    s/p pericardial window 06/16/2014 per Dr. Darcey Nora  . Pleural effusion, bilateral 06/30/2014    Past Surgical History  Procedure Laterality Date  . Uterine ablasion  2012  . Subxyphoid pericardial window N/A 06/16/2014    Procedure: SUBXYPHOID PERICARDIAL WINDOW;  Surgeon: Ivin Poot, MD;  Location: [facility] OR;  Service: Thoracic;  Laterality: N/A;  . Tee without cardioversion N/A 06/16/2014    Procedure: TRANSESOPHAGEAL ECHOCARDIOGRAM (TEE);  Surgeon: Ivin Poot, MD;  Location: [facility] OR;  Service: Thoracic;  Laterality: N/A;  . Pericardial tap N/A 06/11/2014    Procedure: PERICARDIAL TAP;  Surgeon: Blane Ohara, MD;  Location: [facility] CATH LAB;  Service: Cardiovascular;  Laterality: N/A;    There were no vitals filed for this visit.  Visit Diagnosis:  Cervicalgia - Plan: PT plan of care cert/re-cert  Stiffness of cervical spine - Plan: PT plan of care cert/re-cert  Abnormal posture - Plan: PT plan of care cert/re-cert  Abnormal sensation of upper extremity - Plan: PT  plan of care cert/re-cert      Subjective Assessment - 04/23/15 0815    Subjective Neck and shoulders are "so sore on both sides," per pt. Has seen a PT for this about a year ago but did not complete secondary to contracting virus; hospitalized for pericarditis. Pt reports having had problems with neck pain/stiffness on and off since she can remember. Pt reports intermittent numbness in B hands. When asked about h/o falls, pt states having had "maybe one" during MS-related relapse. However, pt reports that balance, gait stability is not an issue for her at this time.   Pertinent History cervical spondylosis, relapsng remitting MS. asthma, depression, HTN, pericarditis (2015).   Limitations Other (comment)  turning head   Patient Stated Goals "I'd like to feel better, loosen up my neck, and to get some exercises for long term because this has been a chronic thing."   Currently in Pain? Yes   Pain Score 5    Pain Orientation Right;Left;Mid;Lower   Pain Descriptors / Indicators Other (Comment)  stiffness   Pain Type Chronic pain   Pain Radiating Towards shoulders (L > R)   Pain Onset More than a month ago   Pain Frequency Constant   Aggravating Factors  after a full week of work; prolonged sitting at desk   Pain Relieving Factors heating pad; rest   Effect of Pain on Daily Activities limits ability to turn  head   Multiple Pain Sites No            [facility] PT Assessment - 04/23/15 0001    Assessment   Medical Diagnosis cervical spondylosis   Onset Date/Surgical Date 10/10/14  date of recent exacerbation of symptoms   Hand Dominance Right   Prior Therapy one year ago at [facility]   Precautions   Precautions Fall   Restrictions   Weight Bearing Restrictions No   Balance Screen   Has the patient fallen in the past 6 months Yes   How many times? 1   Has the patient had a decrease in activity level because of a fear of falling?  No   Is the patient reluctant to leave their  home because of a fear of falling?  No   Home Environment   Living Environment Private residence   Living Arrangements Alone   Type of [place] to enter   Entrance Stairs-Number of Steps 26   Entrance Stairs-Rails Can reach both   Prior Function   Level of Independence Independent   Vocation Full time employment   Engineer, manufacturing job; Licensed conveyancer   Leisure plays double bass   Cognition   Overall Cognitive Status Within Functional Limits for tasks assessed   Observation/Other Assessments   Observations Palpation reveals myofascial restrictions, point tenderness in suboccipital musculature, and R > L sternocleidomastoid muscles.   Other Surveys  Other Surveys   Neck Disability Index  26%   Sensation   Light Touch Appears Intact   Proprioception Appears Intact   Additional Comments Pt reports intermittent N/T in BUE's (freqeuncy in R > LUE); however, these symptoms were not reproduced on evaluation today.   Coordination   Gross Motor Movements are Fluid and Coordinated Yes   Posture/Postural Control   Posture/Postural Control Postural limitations   Postural Limitations Rounded Shoulders;Forward head;Increased thoracic kyphosis   Posture Comments At rest, pt positioned in lower cervical flexion, upper cervical extension.   ROM / Strength   AROM / PROM / Strength AROM;PROM;Strength   AROM   Overall AROM  Deficits   AROM Assessment Site Cervical   Cervical Flexion 34  "pulling"; no concordant pain   Cervical Extension 34  nonconcordant "stiffness" on R side   Cervical - Right Side Bend 43  limited by pain/stiffness   Cervical - Left Side Bend 21  limited by pain/stiffness   Cervical - Right Rotation 56  limited by pain/stiffness   Cervical - Left Rotation 44  limited by pain/stiffness   Strength   Overall Strength Deficits   Overall Strength Comments Strength grossly WFL in BUE's; with exception of R shoulder flexion 4/5.   Special Tests     Special Tests Cervical;Thoracic Outlet Syndrome   Cervical Tests Spurling's;Dictraction;Vertebral Artery Test;other;other2   Spurling's   Findings Positive   Side Right   Comment for increased symptoms in R shoulder   other    Findings Positive   Side Right   Comment Bakody's   other    Findings Unable to test   Comment Unable to formally assess Craniocervical Flexion Test due to pt tendency to recruit SCM to achieve cervical retraction.   Bed Mobility   Bed Mobility Supine to Sit;Sit to Supine   Supine to Sit 7: Independent   Sit to Supine 7: Independent   Transfers   Transfers Sit to Stand;Stand to Sit   Sit to Stand 7: Independent   Stand to  Sit 7: Independent   Ambulation/Gait   Ambulation/Gait Yes   Ambulation/Gait Assistance 7: Independent   Ambulation Distance (Feet) 130 Feet   Assistive device None   Gait Pattern Within Functional Limits   Ambulation Surface Level;Indoor                   [facility] Adult PT Treatment/Exercise - 04/23/15 0001    Wheelchair Mobility   Wheelchair Mobility --   Exercises   Exercises Neck   Neck Exercises: Seated   Neck Retraction 10 reps   Neck Retraction Limitations multimodal cueing for proper technique   Neck Exercises: Supine   Neck Retraction 20 reps   Neck Retraction Limitations Required cueing during initial 10 reps for proper technique/alignment with effective return demonstration for subsequent 10 reps.                PT Education - 04/23/15 1033    Education provided Yes   Education Details Pt evaluation findings, goals, and POC. Effect of neck posture on symptoms. HEP: chin tuck/cervical retraction.   Person(s) Educated Patient   Methods Explanation;Demonstration;Verbal cues   Comprehension Verbalized understanding;Returned demonstration          PT Short Term Goals - 04/23/15 1100    PT SHORT TERM GOAL #1   Title Pt will perform home exercises with mod I using paper handout to indicate safe  HEP compliance, to maximize functional gains made in PT. Target date: 05/21/15   PT SHORT TERM GOAL #2   Title Pt will verbalize understanding of ergonomic work station setup to promote postural normalization, mitigate symptoms associated with prolonged sitting at desk. Target date: 05/21/15   PT SHORT TERM GOAL #3   Title Pt will exhibit 60 degrees of cervical spine rotation AROM bilaterally with no increase in pain/symptoms to increase safety with driving. Target date: 05/21/15           PT Long Term Goals - 04/23/15 1110    PT LONG TERM GOAL #1   Title Pt will decrease Neck Disability Index score from 26% to 16% to indicate significant improvement in QOL related to cervical spine pain/symptoms. Target date: 06/18/15   PT LONG TERM GOAL #2   Title Pt will perform Craniocervical Flexion Test for 3 consecutive increments to indicate pt ability to effectively utilize deep neck flexors. Target date: 06/18/15.   PT LONG TERM GOAL #3   Title Pt will exhibit 70 degrees of B active cervical spine rotation to indicate progress toward normalized ROM and increase safety with driving.  Target date: 06/18/15.   PT LONG TERM GOAL #4   Title Pt will exhibit 35 degrees of B active cervical spine lateral flexion to indicate progress toward normalized ROM. Target date: 06/18/15.   PT LONG TERM GOAL #5   Title Pt will participate in all above activities with no more than 2-point increase in pain/symptoms to indicate with no more than 2-point increase in within-session pain rating to indicate decreased impact of pain on functional activity tolerance. Target date: 06/18/15.               Plan - 04/23/15 1048    Clinical Impression Statement Pt is a 54 y/o F with PMH significant for multiple sclerosis referred to outpatient PT to address impairments associated with cervical spondylosis. PT evaluation reveals the following impairments: primary pain/stiffness in B aspect of mid/lower cervical spine; secondary  pain in R shoulder; tertiary numbness/tingling (not reproduced on PT evaluation) in B  hands; limited and painful cervical spine AROM in all planes; abnormal posture; decreased strength/endurance in deep neck flexor muscles; Neck Disability Index score decreased QOL associated with neck pain/symptoms. Pt will benefit from skilled outpatient PT 2x/week for 4 weeks following by 1x/week for subsequent 4 weeks to address said impairments.   Pt will benefit from skilled therapeutic intervention in order to improve on the following deficits Decreased endurance;Increased fascial restricitons;Hypomobility;Decreased strength;Decreased range of motion;Impaired flexibility;Improper body mechanics;Pain   Rehab Potential Good   PT Frequency Other (comment)  2x/week for 4 weeks then 1x/week for subsequent 4 weeks   PT Duration Other (comment)  See above   PT Treatment/Interventions Manual techniques;Therapeutic exercise;Therapeutic activities;Neuromuscular re-education;Patient/family education;Moist Heat   PT Next Visit Plan Utilize BP cuff as feedback for activation of deep neck flexors (without compensation); add to HEP   PT Home Exercise Plan Chin tucks, stretching SCM and scalenes; scapular retraction/depression   Consulted and Agree with Plan of Care Patient         Problem List Patient Active Problem List   Diagnosis Date Noted  . Pleural effusion, bilateral 06/30/2014  . Shortness of breath 06/29/2014  . Hypertension 06/29/2014  . Pericarditis 06/29/2014  . Pericardial effusion 06/16/2014  . Pericardial effusion, acute 06/14/2014  . Cardiac/pericardial tamponade 06/11/2014  . Cardiac tamponade 06/11/2014  . Multiple sclerosis 11/21/2013    .Billie Ruddy, PT, DPT [facility] [address] [place], [place], [address] Phone: [phone]   Fax:  [phone] 04/23/2015, 11:37 AM

## 2015-04-30 ENCOUNTER — Ambulatory Visit: Payer: 59 | Admitting: Physical Therapy

## 2015-04-30 DIAGNOSIS — R209 Unspecified disturbances of skin sensation: Secondary | ICD-10-CM

## 2015-04-30 DIAGNOSIS — M542 Cervicalgia: Secondary | ICD-10-CM | POA: Diagnosis not present

## 2015-04-30 DIAGNOSIS — M436 Torticollis: Secondary | ICD-10-CM

## 2015-04-30 DIAGNOSIS — R293 Abnormal posture: Secondary | ICD-10-CM

## 2015-04-30 NOTE — Therapy (Signed)
[facility] [address] [place], [place], [address] Phone: [phone]   Fax:  [phone]  Physical Therapy Treatment  Patient Details  Name: [name] MRN: [id number] Date of Birth: [birth year] Referring Provider:  Leighton Ruff, MD  Encounter Date: 04/30/2015      PT End of Session - 04/30/15 0906    Visit Number 2   Number of Visits 13   Date for PT Re-Evaluation 06/22/15   Authorization Type BC[employer]   PT Start Time [id number]  Pt arrived late for session   PT Stop Time 0847   PT Time Calculation (min) 35 min   Activity Tolerance Patient tolerated treatment well;No increased pain   Behavior During Therapy [facility] for tasks assessed/performed      Past Medical History  Diagnosis Date  . Asthma   . Hypertension   . Depression   . Back pain   . Muscle pain   . Obesity   . Uveitis     Bilateral  . MS (multiple sclerosis)   . Cardiac/pericardial tamponade 06/11/2014    s/p pericardial window 06/16/2014 per Dr. Darcey Nora  . Pleural effusion, bilateral 06/30/2014    Past Surgical History  Procedure Laterality Date  . Uterine ablasion  2012  . Subxyphoid pericardial window N/A 06/16/2014    Procedure: SUBXYPHOID PERICARDIAL WINDOW;  Surgeon: Ivin Poot, MD;  Location: [facility] OR;  Service: Thoracic;  Laterality: N/A;  . Tee without cardioversion N/A 06/16/2014    Procedure: TRANSESOPHAGEAL ECHOCARDIOGRAM (TEE);  Surgeon: Ivin Poot, MD;  Location: [facility] OR;  Service: Thoracic;  Laterality: N/A;  . Pericardial tap N/A 06/11/2014    Procedure: PERICARDIAL TAP;  Surgeon: Blane Ohara, MD;  Location: [facility] CATH LAB;  Service: Cardiovascular;  Laterality: N/A;    There were no vitals filed for this visit.  Visit Diagnosis:  Cervicalgia  Stiffness of cervical spine  Abnormal posture  Abnormal sensation of upper extremity      Subjective Assessment - 04/30/15 0814    Subjective Pt reporting increased shooting  pain into L shoulder when performing cervical spine retraction home exercise.   Pertinent History cervical spondylosis, relapsing remitting MS. asthma, depression, HTN, pericarditis (2015).   Currently in Pain? Yes   Pain Score 3    Pain Location Neck   Pain Orientation Right;Left;Lower   Pain Descriptors / Indicators Other (Comment)  stiffness   Pain Type Chronic pain   Pain Radiating Towards shoulders (R > L)   Pain Onset More than a month ago   Pain Frequency Constant   Aggravating Factors  after a full week of work; prolonged sitting at desk   Pain Relieving Factors heating pad; rest   Effect of Pain on Daily Activities limits ability to turn head   Multiple Pain Sites No                         [facility] Adult PT Treatment/Exercise - 04/30/15 0001    Posture/Postural Control   Posture/Postural Control Postural limitations   Postural Limitations Rounded Shoulders;Forward head;Increased thoracic kyphosis   Posture Comments At rest, pt positioned in lower cervical flexion, upper cervical extension.   Exercises   Exercises Neck   Neck Exercises: Seated   Other Seated Exercise Middle scalene self-stretch 2 x60-second holds per side with cueing for proper alignment/technique. Due to onset of pain radiating into RUE, instucted pt to decrease stretch.   Neck Exercises: Supine   Neck  Retraction 10 reps  x2 trials   Neck Retraction Limitations Using towel roll; x5-second holds each. Pt reporting no radicular symptoms in shoulder   Other Supine Exercise Performed Craniocervical flexion test/protocol to assess/address activation/muscular endurance of deep neck flexors. With multimodal cueing and blocked practice, pt able to perform x2 consecutive increments (x10 second holds at 22 mm Hg then 24 mm Hg) prior to fatigue. Cueing required to decrease compensation of SCM (R > L).   Manual Therapy   Manual therapy comments Supine: suboccipital release x4 minutes to increase  extensibility of suboccipital musculature, promote more normalized posture.                PT Education - 04/30/15 0902    Education provided Yes   Education Details HEP: supine chin tuck; seated middle scalene self-stretch. Work Scient[employer], water quality.   Person(s) Educated Patient   Methods Explanation;Demonstration;Verbal cues;Handout;Tactile cues   Comprehension Verbalized understanding;Returned demonstration          PT Short Term Goals - 04/23/15 1100    PT SHORT TERM GOAL #1   Title Pt will perform home exercises with mod I using paper handout to indicate safe HEP compliance, to maximize functional gains made in PT. Target date: 05/21/15   PT SHORT TERM GOAL #2   Title Pt will verbalize understanding of ergonomic work station setup to promote postural normalization, mitigate symptoms associated with prolonged sitting at desk. Target date: 05/21/15   PT SHORT TERM GOAL #3   Title Pt will exhibit 60 degrees of cervical spine rotation AROM bilaterally with no increase in pain/symptoms to increase safety with driving. Target date: 05/21/15           PT Long Term Goals - 04/23/15 1110    PT LONG TERM GOAL #1   Title Pt will decrease Neck Disability Index score from 26% to 16% to indicate significant improvement in QOL related to cervical spine pain/symptoms. Target date: 06/18/15   PT LONG TERM GOAL #2   Title Pt will perform Craniocervical Flexion Test for 3 consecutive increments to indicate pt ability to effectively utilize deep neck flexors. Target date: 06/18/15.   PT LONG TERM GOAL #3   Title Pt will exhibit 70 degrees of B active cervical spine rotation to indicate progress toward normalized ROM and increase safety with driving.  Target date: 06/18/15.   PT LONG TERM GOAL #4   Title Pt will exhibit 35 degrees of B active cervical spine lateral flexion to indicate progress toward normalized ROM. Target date: 06/18/15.   PT LONG TERM GOAL #5   Title Pt will participate in  all above activities with no more than 2-point increase in pain/symptoms to indicate with no more than 2-point increase in within-session pain rating to indicate decreased impact of pain on functional activity tolerance. Target date: 06/18/15.               Plan - 04/30/15 0909    Clinical Impression Statement Skilled session focused on the following: increasing extensibility of suboccipital musculature; promoting activation, increasing endurance of deep neck flexors; and providing education on work station ergonomics to decrease pain. Pt tolerated all interventions without increased pain/symptoms. Continue per POC.   Pt will benefit from skilled therapeutic intervention in order to improve on the following deficits Decreased endurance;Increased fascial restricitons;Hypomobility;Decreased strength;Decreased range of motion;Impaired flexibility;Improper body mechanics;Pain   Rehab Potential Good   PT Frequency Other (comment)  2x/week for 4 weeks then 1x/week for subsequent 4 weeks  PT Duration Other (comment)  See above   PT Treatment/Interventions Manual techniques;Therapeutic exercise;Therapeutic activities;Neuromuscular re-education;Patient/family education;Moist Heat   PT Next Visit Plan Ask about current HEP. Add stretches for anterior scalenes, SCM muscles. Add scapular retraction/depression.   PT Home Exercise Plan Chin tucks, stretching SCM and scalenes; scapular retraction/depression   Consulted and Agree with Plan of Care Patient        Problem List Patient Active Problem List   Diagnosis Date Noted  . Pleural effusion, bilateral 06/30/2014  . Shortness of breath 06/29/2014  . Hypertension 06/29/2014  . Pericarditis 06/29/2014  . Pericardial effusion 06/16/2014  . Pericardial effusion, acute 06/14/2014  . Cardiac/pericardial tamponade 06/11/2014  . Cardiac tamponade 06/11/2014  . Multiple sclerosis 11/21/2013    Billie Ruddy, PT, DPT [facility] [address] [place], [place], [address] Phone: [phone]   Fax:  [phone] 04/30/2015, 9:11 AM

## 2015-04-30 NOTE — Patient Instructions (Signed)
Head Press With [facility] a small towel roll under the top of your head (just below the arrow on picture). Tuck chin SLIGHTLY toward chest, keep mouth closed.   5 seconds holds, 10 reps. 2-3 times per day.   Ear / Shoulder Stretch   In seated, tuck your chin. Then, bring your left ear toward left shoulder until you feel a deep stretch in your neck. If you begin to get symptoms in your arm, decrease the amount of movement until the symptoms subside.  Hold for 60 seconds. Relax. Repeat on right side. Perform 2-3 reps per day on each side.

## 2015-05-02 ENCOUNTER — Ambulatory Visit: Payer: 59 | Admitting: Physical Therapy

## 2015-05-02 DIAGNOSIS — R209 Unspecified disturbances of skin sensation: Secondary | ICD-10-CM

## 2015-05-02 DIAGNOSIS — M542 Cervicalgia: Secondary | ICD-10-CM | POA: Diagnosis not present

## 2015-05-02 DIAGNOSIS — R293 Abnormal posture: Secondary | ICD-10-CM

## 2015-05-02 DIAGNOSIS — M436 Torticollis: Secondary | ICD-10-CM

## 2015-05-02 NOTE — Therapy (Signed)
[facility] [address] [place], [place], [address] Phone: [phone]   Fax:  [phone]  Physical Therapy Treatment  Patient Details  Name: [name] MRN: [id number] Date of Birth: 1961/05/09 Referring Provider:  Leighton Ruff, MD  Encounter Date: 05/02/2015      PT End of Session - 05/02/15 1134    Visit Number 3   Date for PT Re-Evaluation 06/22/15   Authorization Type [employer]   PT Start Time [id number]  Pt arrived late to session   PT Stop Time 0846   PT Time Calculation (min) 35 min   Activity Tolerance Patient tolerated treatment well;No increased pain   Behavior During Therapy [facility] for tasks assessed/performed      Past Medical History  Diagnosis Date  . Asthma   . Hypertension   . Depression   . Back pain   . Muscle pain   . Obesity   . Uveitis     Bilateral  . MS (multiple sclerosis)   . Cardiac/pericardial tamponade 06/11/2014    s/p pericardial window 06/16/2014 per Dr. Darcey Nora  . Pleural effusion, bilateral 06/30/2014    Past Surgical History  Procedure Laterality Date  . Uterine ablasion  2012  . Subxyphoid pericardial window N/A 06/16/2014    Procedure: SUBXYPHOID PERICARDIAL WINDOW;  Surgeon: Ivin Poot, MD;  Location: [facility] OR;  Service: Thoracic;  Laterality: N/A;  . Tee without cardioversion N/A 06/16/2014    Procedure: TRANSESOPHAGEAL ECHOCARDIOGRAM (TEE);  Surgeon: Ivin Poot, MD;  Location: [facility] OR;  Service: Thoracic;  Laterality: N/A;  . Pericardial tap N/A 06/11/2014    Procedure: PERICARDIAL TAP;  Surgeon: Blane Ohara, MD;  Location: [facility] CATH LAB;  Service: Cardiovascular;  Laterality: N/A;    There were no vitals filed for this visit.  Visit Diagnosis:  Cervicalgia  Stiffness of cervical spine  Abnormal posture  Abnormal sensation of upper extremity      Subjective Assessment - 05/02/15 0813    Subjective Pt reporting feeling "a little better" today, stating,  "Last night was the first night in a while that pain hasn't kept me awake in bed."   Pertinent History cervical spondylosis, relapsing remitting MS. asthma, depression, HTN, pericarditis (2015).   Patient Stated Goals "I'd like to feel better, loosen up my neck, and to get some exercises for long term because this has been a chronic thing."   Currently in Pain? Yes   Pain Score 3    Pain Location Neck   Pain Orientation Right;Left;Lower   Pain Descriptors / Indicators Other (Comment)  "stiffness"   Pain Type Chronic pain   Pain Radiating Towards shoulders (R > L)   Pain Onset More than a month ago   Pain Frequency Constant   Aggravating Factors  after a full week for work; prolonged sitting at desk   Pain Relieving Factors heating pad; rest   Effect of Pain on Daily Activities limits ability to turn head   Multiple Pain Sites No                         [facility] Adult PT Treatment/Exercise - 05/02/15 0001    Posture/Postural Control   Posture/Postural Control Postural limitations   Postural Limitations Rounded Shoulders;Forward head;Increased thoracic kyphosis   Posture Comments Lower cervical flexion, upper cervical extension.   Exercises   Exercises Neck   Neck Exercises: Theraband   Scapula Retraction 10 reps;Other (comment)   Scapula Retraction  Limitations Seated scapular rows with focus on scapular retraction and depression; tactile cueing provided for technique.  No resistance.   Neck Exercises: Seated   Cervical Rotation 5 reps;Other (comment)   Cervical Rotation Limitations 2 x5 reps in B directions; 10-second holds at end range. Cueing for starting position.   Other Seated Exercise Middle scalene stretch x60-second holds per side with effective betweem-session carryover, no pt-reported UE symptoms or pain reported. Attempted to teach pt anterior scalene stretch and SCM stretch; however, pt with increased difficulty performing correctly.   Manual Therapy    Manual therapy comments Supine: suboccipital release x3 minutes to improve cervical spine posture; contract-relax PNF (3 x 3-second holds) alternating with prolonged passive stretch (x30-second holds each) of the following muscle groups: B anterior scalenes (extensibility in R > L); sternocleidomastoid. Noted point tenderness to palpation, increased myofascial adhesions in R SCM.                 PT Education - 05/02/15 1134    Education provided Yes   Education Details Added exercises to HEP; see Pt Instructions.   Person(s) Educated Patient   Methods Explanation;Demonstration;Verbal cues;Tactile cues;Handout   Comprehension Verbalized understanding;Returned demonstration          PT Short Term Goals - 04/23/15 1100    PT SHORT TERM GOAL #1   Title Pt will perform home exercises with mod I using paper handout to indicate safe HEP compliance, to maximize functional gains made in PT. Target date: 05/21/15   PT SHORT TERM GOAL #2   Title Pt will verbalize understanding of ergonomic work station setup to promote postural normalization, mitigate symptoms associated with prolonged sitting at desk. Target date: 05/21/15   PT SHORT TERM GOAL #3   Title Pt will exhibit 60 degrees of cervical spine rotation AROM bilaterally with no increase in pain/symptoms to increase safety with driving. Target date: 05/21/15           PT Long Term Goals - 04/23/15 1110    PT LONG TERM GOAL #1   Title Pt will decrease Neck Disability Index score from 26% to 16% to indicate significant improvement in QOL related to cervical spine pain/symptoms. Target date: 06/18/15   PT LONG TERM GOAL #2   Title Pt will perform Craniocervical Flexion Test for 3 consecutive increments to indicate pt ability to effectively utilize deep neck flexors. Target date: 06/18/15.   PT LONG TERM GOAL #3   Title Pt will exhibit 70 degrees of B active cervical spine rotation to indicate progress toward normalized ROM and increase  safety with driving.  Target date: 06/18/15.   PT LONG TERM GOAL #4   Title Pt will exhibit 35 degrees of B active cervical spine lateral flexion to indicate progress toward normalized ROM. Target date: 06/18/15.   PT LONG TERM GOAL #5   Title Pt will participate in all above activities with no more than 2-point increase in pain/symptoms to indicate with no more than 2-point increase in within-session pain rating to indicate decreased impact of pain on functional activity tolerance. Target date: 06/18/15.               Plan - 05/02/15 1141    Clinical Impression Statement Session focused on increasing extensibility anterior cervical musculature and suboccipital musculature to improve neck posture, decrease pain and symptoms. Pt reporting ability to sleep last night without interference of neck pain. HEP progressed. Continue per POC.   Pt will benefit from skilled therapeutic intervention in  order to improve on the following deficits Decreased endurance;Increased fascial restricitons;Hypomobility;Decreased strength;Decreased range of motion;Impaired flexibility;Improper body mechanics;Pain   Rehab Potential Good   PT Frequency Other (comment)  2x/week for 4 weeks then 1x/week for subsequent 4 weeks   PT Duration Other (comment)  See above   PT Treatment/Interventions Manual techniques;Therapeutic exercise;Therapeutic activities;Neuromuscular re-education;Patient/family education;Moist Heat   PT Next Visit Plan Add scapular retraction/depression and scapular stabilization due to increased tightness of upper trapezius (R > L)   Consulted and Agree with Plan of Care Patient        Problem List Patient Active Problem List   Diagnosis Date Noted  . Pleural effusion, bilateral 06/30/2014  . Shortness of breath 06/29/2014  . Hypertension 06/29/2014  . Pericarditis 06/29/2014  . Pericardial effusion 06/16/2014  . Pericardial effusion, acute 06/14/2014  . Cardiac/pericardial tamponade  06/11/2014  . Cardiac tamponade 06/11/2014  . Multiple sclerosis 11/21/2013    Billie Ruddy, PT, DPT [facility] [address] [place], [place], [address] Phone: [phone]   Fax:  [phone] 05/02/2015, 11:44 AM

## 2015-05-02 NOTE — Patient Instructions (Addendum)
Flexibility: Upper Trapezius Stretch   Gently grasp right side of head while reaching behind back with other hand. Tilt head away until a gentle stretch is felt. Hold __60__ seconds. Relax; repeat on opposite side.   Do this stretch at least twice per day on each side.   Rotation   Look straight ahead. Make sure chin is slightly tucked (good neck posture), then turn to look over one shoulder as far as is comfortable. Stop just before onset of pain.Hold __10__ seconds. Repeat on other side. 5 reps, twice per day on each side.

## 2015-05-09 ENCOUNTER — Ambulatory Visit: Payer: 59 | Admitting: Physical Therapy

## 2015-05-09 DIAGNOSIS — M542 Cervicalgia: Secondary | ICD-10-CM | POA: Diagnosis not present

## 2015-05-09 DIAGNOSIS — R293 Abnormal posture: Secondary | ICD-10-CM

## 2015-05-09 DIAGNOSIS — M436 Torticollis: Secondary | ICD-10-CM

## 2015-05-09 NOTE — Patient Instructions (Addendum)
Sternocleidomastoid (SCM) Stretch In sitting, place one hand over the front of your collarbone, then extend your head backwards and to each side. Hold for 30 seconds. Relax. Repeat. Do this 4 times times per day on the right side. Hold 20 Seconds Complete 4 Sets Perform 2 Time(s) a Day    No money stretch with towel roll Lay with towel up spine between shoulder blades and rotation arm out to sides with elbows by sides and palms up. Hold for 20 seconds. Relax. Repeat. Do this 4-6 times. Perform 1-2 sets daily. Repeat 10 Times Hold 3 Seconds Perform 2 Time(s) a Day   Scapular Clocks (Middle trap/Lower trap) In seated with your arms by your sides, squeeze should blades together and downward (as pictured) and hold for 3 seconds. relax. Repeat. Perform 10 reps, 2 times per day. Hold 1 Minute Perform 2 Time(s) a Day   Neck - Levator Scapula Stretch Sit in a chair and hold onto the underside of the seat with your right hand Rotate your head to the LEFT and take your LEFT hand and pull your head and nose toward your LEFT arm pit. Hold for 60 seconds, twice per day.

## 2015-05-09 NOTE — Therapy (Signed)
[place] [address] [place], [place], [address] Phone: [phone]   Fax:  [phone]  Physical Therapy Treatment  Patient Details  Name: [name] MRN: [id number] Date of Birth: Apr 16, 1961 Referring Provider:  Leighton Ruff, MD  Encounter Date: 05/09/2015      PT End of Session - 05/09/15 1103    Visit Number 4   Number of Visits 13   Date for PT Re-Evaluation 06/22/15   Authorization Type BCBS   PT Start Time 0809  Pt arrived late for session   PT Stop Time 0846   PT Time Calculation (min) 37 min   Activity Tolerance Patient tolerated treatment well;No increased pain   Behavior During Therapy [facility] for tasks assessed/performed      Past Medical History  Diagnosis Date  . Asthma   . Hypertension   . Depression   . Back pain   . Muscle pain   . Obesity   . Uveitis     Bilateral  . MS (multiple sclerosis)   . Cardiac/pericardial tamponade 06/11/2014    s/p pericardial window 06/16/2014 per Dr. Darcey Nora  . Pleural effusion, bilateral 06/30/2014    Past Surgical History  Procedure Laterality Date  . Uterine ablasion  2012  . Subxyphoid pericardial window N/A 06/16/2014    Procedure: SUBXYPHOID PERICARDIAL WINDOW;  Surgeon: Ivin Poot, MD;  Location: [facility] OR;  Service: Thoracic;  Laterality: N/A;  . Tee without cardioversion N/A 06/16/2014    Procedure: TRANSESOPHAGEAL ECHOCARDIOGRAM (TEE);  Surgeon: Ivin Poot, MD;  Location: [facility] OR;  Service: Thoracic;  Laterality: N/A;  . Pericardial tap N/A 06/11/2014    Procedure: PERICARDIAL TAP;  Surgeon: Blane Ohara, MD;  Location: [facility] CATH LAB;  Service: Cardiovascular;  Laterality: N/A;    There were no vitals filed for this visit.  Visit Diagnosis:  Cervicalgia  Stiffness of cervical spine  Abnormal posture      Subjective Assessment - 05/09/15 0813    Subjective Pt reporting increased pain in R lower cervical spine and R shoulder.  Has been having difficulty sleeping and tends to end up on her stomach or right side.   Pertinent History cervical spondylosis, relapsing remitting MS. asthma, depression, HTN, pericarditis (2015).   Limitations Other (comment)  turning head   Patient Stated Goals "I'd like to feel better, loosen up my neck, and to get some exercises for long term because this has been a chronic thing."   Currently in Pain? Yes   Pain Score 4    Pain Location Neck   Pain Orientation Right;Lower   Pain Descriptors / Indicators Sharp   Pain Type Chronic pain   Pain Radiating Towards R shoulder   Pain Onset More than a month ago   Pain Frequency Constant   Aggravating Factors  after sitting/working   Pain Relieving Factors heating pad; rest   Effect of Pain on Daily Activities limits sleep   Multiple Pain Sites No            [facility] PT Assessment - 05/09/15 0001    Palpation   Palpation comment increased point tenderness, soft tissue adhesions noted in R sternocleidomastoid and R levator scapulae.                     [place] Adult PT Treatment/Exercise - 05/09/15 0001    Posture/Postural Control   Posture/Postural Control Postural limitations   Postural Limitations Rounded Shoulders;Forward head;Increased thoracic kyphosis   Posture  Comments Noted mild improvement in cervical spine posture at rest (less lower cervical spine flexion/upper extension).   Exercises   Exercises Neck   Neck Exercises: Theraband   Scapula Retraction --   Scapula Retraction Limitations --  No resistance.   Neck Exercises: Seated   Cervical Rotation --   Cervical Rotation Limitations --   Other Seated Exercise --   Neck Exercises: Supine   Other Supine Exercise Explained, demonstrated modified HEP. See Pt Instructions for details on all exercises/stetches, reps, sets, frequency, duration. Cueing provided throughout for proper alignment.   Manual Therapy   Manual therapy comments Suboccipital release x3  minutes to increase extensibility of suboccipital musculature, to improve postural alignment.                PT Education - 05/09/15 0849    Education provided Yes   Education Details Modified HEP. Explained how sleeping on stomach with arms up, neck rotated can shorten R SCM and levator scap muscles.   Person(s) Educated Patient   Methods Explanation;Demonstration;Handout   Comprehension Verbalized understanding;Returned demonstration          PT Short Term Goals - 04/23/15 1100    PT SHORT TERM GOAL #1   Title Pt will perform home exercises with mod I using paper handout to indicate safe HEP compliance, to maximize functional gains made in PT. Target date: 05/21/15   PT SHORT TERM GOAL #2   Title Pt will verbalize understanding of ergonomic work station setup to promote postural normalization, mitigate symptoms associated with prolonged sitting at desk. Target date: 05/21/15   PT SHORT TERM GOAL #3   Title Pt will exhibit 60 degrees of cervical spine rotation AROM bilaterally with no increase in pain/symptoms to increase safety with driving. Target date: 05/21/15           PT Long Term Goals - 04/23/15 1110    PT LONG TERM GOAL #1   Title Pt will decrease Neck Disability Index score from 26% to 16% to indicate significant improvement in QOL related to cervical spine pain/symptoms. Target date: 06/18/15   PT LONG TERM GOAL #2   Title Pt will perform Craniocervical Flexion Test for 3 consecutive increments to indicate pt ability to effectively utilize deep neck flexors. Target date: 06/18/15.   PT LONG TERM GOAL #3   Title Pt will exhibit 70 degrees of B active cervical spine rotation to indicate progress toward normalized ROM and increase safety with driving.  Target date: 06/18/15.   PT LONG TERM GOAL #4   Title Pt will exhibit 35 degrees of B active cervical spine lateral flexion to indicate progress toward normalized ROM. Target date: 06/18/15.   PT LONG TERM GOAL #5    Title Pt will participate in all above activities with no more than 2-point increase in pain/symptoms to indicate with no more than 2-point increase in within-session pain rating to indicate decreased impact of pain on functional activity tolerance. Target date: 06/18/15.               Plan - 05/09/15 1327    Clinical Impression Statement Noted point tenderness and soft tissue restrictions/myofascial adhesions in R levator scapulae, R SCM muscles. Modified HEP to include self-stretches of these muscles as well as scapular retraction/depression for improved postural alignment. Continue per POC.   Pt will benefit from skilled therapeutic intervention in order to improve on the following deficits Decreased endurance;Increased fascial restricitons;Hypomobility;Decreased strength;Decreased range of motion;Impaired flexibility;Improper body mechanics;Pain   Rehab  Potential Good   PT Frequency Other (comment)  2x/week for 4 weeks then 1x/week for subsequent 4 weeks   PT Duration Other (comment)  See above   PT Treatment/Interventions Manual techniques;Therapeutic exercise;Therapeutic activities;Neuromuscular re-education;Patient/family education;Moist Heat   PT Next Visit Plan Re-visit craniocervical flexion protocol   Consulted and Agree with Plan of Care Patient        Problem List Patient Active Problem List   Diagnosis Date Noted  . Pleural effusion, bilateral 06/30/2014  . Shortness of breath 06/29/2014  . Hypertension 06/29/2014  . Pericarditis 06/29/2014  . Pericardial effusion 06/16/2014  . Pericardial effusion, acute 06/14/2014  . Cardiac/pericardial tamponade 06/11/2014  . Cardiac tamponade 06/11/2014  . Multiple sclerosis 11/21/2013   Billie Ruddy, PT, DPT [facility] [address] [place], [place], [address] Phone: [phone]   Fax:  [phone] 05/09/2015, 2:01 PM

## 2015-05-11 ENCOUNTER — Ambulatory Visit: Payer: 59 | Admitting: Physical Therapy

## 2015-05-11 ENCOUNTER — Encounter: Payer: Self-pay | Admitting: Physical Therapy

## 2015-05-11 DIAGNOSIS — R293 Abnormal posture: Secondary | ICD-10-CM

## 2015-05-11 DIAGNOSIS — M436 Torticollis: Secondary | ICD-10-CM

## 2015-05-11 DIAGNOSIS — M542 Cervicalgia: Secondary | ICD-10-CM | POA: Diagnosis not present

## 2015-05-11 DIAGNOSIS — R209 Unspecified disturbances of skin sensation: Secondary | ICD-10-CM

## 2015-05-11 NOTE — Therapy (Signed)
[place] [address] [place], [place], [address] Phone: [phone]   Fax:  [phone]  Physical Therapy Treatment  Patient Details  Name: [name] MRN: [id number] Date of Birth: 1961/01/28 Referring Provider:  Leighton Ruff, MD  Encounter Date: 05/11/2015      PT End of Session - 05/11/15 0936    Visit Number 5   Number of Visits 13   Date for PT Re-Evaluation 06/22/15   Authorization Type BCBS   PT Start Time 0931   PT Stop Time 1013   PT Time Calculation (min) 42 min   Activity Tolerance Patient tolerated treatment well;No increased pain   Behavior During Therapy [facility] for tasks assessed/performed      Past Medical History  Diagnosis Date  . Asthma   . Hypertension   . Depression   . Back pain   . Muscle pain   . Obesity   . Uveitis     Bilateral  . MS (multiple sclerosis)   . Cardiac/pericardial tamponade 06/11/2014    s/p pericardial window 06/16/2014 per Dr. Darcey Nora  . Pleural effusion, bilateral 06/30/2014    Past Surgical History  Procedure Laterality Date  . Uterine ablasion  2012  . Subxyphoid pericardial window N/A 06/16/2014    Procedure: SUBXYPHOID PERICARDIAL WINDOW;  Surgeon: Ivin Poot, MD;  Location: [facility] OR;  Service: Thoracic;  Laterality: N/A;  . Tee without cardioversion N/A 06/16/2014    Procedure: TRANSESOPHAGEAL ECHOCARDIOGRAM (TEE);  Surgeon: Ivin Poot, MD;  Location: [facility] OR;  Service: Thoracic;  Laterality: N/A;  . Pericardial tap N/A 06/11/2014    Procedure: PERICARDIAL TAP;  Surgeon: Blane Ohara, MD;  Location: [facility] C[facility] LAB;  Service: Cardiovascular;  Laterality: N/A;    There were no vitals filed for this visit.  Visit Diagnosis:  Cervicalgia  Stiffness of cervical spine  Abnormal posture  Abnormal sensation of upper extremity      Subjective Assessment - 05/11/15 0934    Subjective No new complaints. Feels that the stretching is helping.   Pain Score 2    Pain Location Neck   Pain Orientation Right;Left;Posterior;Upper  neck and upper part of shoulders   Pain Descriptors / Indicators Aching;Tightness   Pain Type Chronic pain   Pain Onset More than a month ago   Pain Frequency Constant   Aggravating Factors  cervical rotation and as the day goes by   Pain Relieving Factors stretching     Treatment: Manual therapy: to bil upper traps, rhomboids, STM and cervical paraspinals. - soft tissue mobs - strain-counter strain - myofascial release - positional release - gentle cervical distraction - passive manual stretching  Exercises - upper trap neural stretch 20 sec's x 3 each side - lateral flexion neural stretch 20 sec's x 3 each side - corner stretch 20 sec's x 3 reps - doorway scalene's neural stretch 20 sec's x 3 each side - posterior shoulder rolls x 10 reps - scapular retraction, 5 sec hold x 10 reps        PT Short Term Goals - 04/23/15 1100    PT SHORT TERM GOAL #1   Title Pt will perform home exercises with mod I using paper handout to indicate safe HEP compliance, to maximize functional gains made in PT. Target date: 05/21/15   PT SHORT TERM GOAL #2   Title Pt will verbalize understanding of ergonomic work station setup to promote postural normalization, mitigate symptoms associated with prolonged sitting at desk.  Target date: 05/21/15   PT SHORT TERM GOAL #3   Title Pt will exhibit 60 degrees of cervical spine rotation AROM bilaterally with no increase in pain/symptoms to increase safety with driving. Target date: 05/21/15           PT Long Term Goals - 04/23/15 1110    PT LONG TERM GOAL #1   Title Pt will decrease Neck Disability Index score from 26% to 16% to indicate significant improvement in QOL related to cervical spine pain/symptoms. Target date: 06/18/15   PT LONG TERM GOAL #2   Title Pt will perform Craniocervical Flexion Test for 3 consecutive increments to indicate pt ability to  effectively utilize deep neck flexors. Target date: 06/18/15.   PT LONG TERM GOAL #3   Title Pt will exhibit 70 degrees of B active cervical spine rotation to indicate progress toward normalized ROM and increase safety with driving.  Target date: 06/18/15.   PT LONG TERM GOAL #4   Title Pt will exhibit 35 degrees of B active cervical spine lateral flexion to indicate progress toward normalized ROM. Target date: 06/18/15.   PT LONG TERM GOAL #5   Title Pt will participate in all above activities with no more than 2-point increase in pain/symptoms to indicate with no more than 2-point increase in within-session pain rating to indicate decreased impact of pain on functional activity tolerance. Target date: 06/18/15.           Plan - 05/11/15 0936    Clinical Impression Statement Pt with palpable trigger points in bil upper traps noted today with manual therapy. Pt did report decreased tightness after session today. Progressing toward goals.   Pt will benefit from skilled therapeutic intervention in order to improve on the following deficits Decreased endurance;Increased fascial restricitons;Hypomobility;Decreased strength;Decreased range of motion;Impaired flexibility;Improper body mechanics;Pain   Rehab Potential Good   PT Frequency Other (comment)  2x/week for 4 weeks then 1x/week for subsequent 4 weeks   PT Duration Other (comment)  See above   PT Treatment/Interventions Manual techniques;Therapeutic exercise;Therapeutic activities;Neuromuscular re-education;Patient/family education;Moist Heat   PT Next Visit Plan Re-visit craniocervical flexion protocol   Consulted and Agree with Plan of Care Patient        Problem List Patient Active Problem List   Diagnosis Date Noted  . Pleural effusion, bilateral 06/30/2014  . Shortness of breath 06/29/2014  . Hypertension 06/29/2014  . Pericarditis 06/29/2014  . Pericardial effusion 06/16/2014  . Pericardial effusion, acute 06/14/2014  .  Cardiac/pericardial tamponade 06/11/2014  . Cardiac tamponade 06/11/2014  . Multiple sclerosis 11/21/2013    Willow Ora 05/11/2015, 10:37 PM  Willow Ora, PTA, [facility] [address], [place], [place] [address] [phone] 05/11/2015, 10:37 PM

## 2015-05-15 ENCOUNTER — Ambulatory Visit: Payer: 59 | Attending: Neurology | Admitting: Physical Therapy

## 2015-05-15 DIAGNOSIS — M542 Cervicalgia: Secondary | ICD-10-CM | POA: Diagnosis present

## 2015-05-15 DIAGNOSIS — R293 Abnormal posture: Secondary | ICD-10-CM | POA: Diagnosis present

## 2015-05-15 DIAGNOSIS — M436 Torticollis: Secondary | ICD-10-CM | POA: Diagnosis present

## 2015-05-15 NOTE — Therapy (Signed)
[place] [address] [place], [place], [address] Phone: [phone]   Fax:  [phone]  Physical Therapy Treatment  Patient Details  Name: [name] MRN: [id number] Date of Birth: 1961/05/21 Referring Provider:  Leighton Ruff, MD  Encounter Date: 05/15/2015      PT End of Session - 05/15/15 1058    Visit Number 6   Number of Visits 13   Date for PT Re-Evaluation 06/22/15   Authorization Type BCBS   PT Start Time 6659  Pt arrived late to session   PT Stop Time 0931   PT Time Calculation (min) 38 min   Activity Tolerance Patient tolerated treatment well;No increased pain   Behavior During Therapy [facility] for tasks assessed/performed      Past Medical History  Diagnosis Date  . Asthma   . Hypertension   . Depression   . Back pain   . Muscle pain   . Obesity   . Uveitis     Bilateral  . MS (multiple sclerosis)   . Cardiac/pericardial tamponade 06/11/2014    s/p pericardial window 06/16/2014 per Dr. Darcey Nora  . Pleural effusion, bilateral 06/30/2014    Past Surgical History  Procedure Laterality Date  . Uterine ablasion  2012  . Subxyphoid pericardial window N/A 06/16/2014    Procedure: SUBXYPHOID PERICARDIAL WINDOW;  Surgeon: Ivin Poot, MD;  Location: [facility] OR;  Service: Thoracic;  Laterality: N/A;  . Tee without cardioversion N/A 06/16/2014    Procedure: TRANSESOPHAGEAL ECHOCARDIOGRAM (TEE);  Surgeon: Ivin Poot, MD;  Location: [facility] OR;  Service: Thoracic;  Laterality: N/A;  . Pericardial tap N/A 06/11/2014    Procedure: PERICARDIAL TAP;  Surgeon: Blane Ohara, MD;  Location: [facility] CATH LAB;  Service: Cardiovascular;  Laterality: N/A;    There were no vitals filed for this visit.  Visit Diagnosis:  Cervicalgia  Stiffness of cervical spine  Abnormal posture      Subjective Assessment - 05/15/15 0857    Subjective Pt reports feeling more flexible; however, reports ongoing pain in R  lower cervical spine, radiating into R upper shoulder.   Pertinent History cervical spondylosis, relapsing remitting MS. asthma, depression, HTN, pericarditis (2015).   Limitations Other (comment)  turning head   Patient Stated Goals "I'd like to feel better, loosen up my neck, and to get some exercises for long term because this has been a chronic thing."   Currently in Pain? Yes   Pain Score 3    Pain Location Neck   Pain Orientation Right;Lower;Posterior   Pain Descriptors / Indicators Aching;Tightness   Pain Type Chronic pain   Pain Radiating Towards into upper R shoulder   Pain Onset More than a month ago   Pain Frequency Constant   Aggravating Factors  cervical rotation; worsens as day progresses. Worst at night.   Pain Relieving Factors stretching   Effect of Pain on Daily Activities limits sleep   Multiple Pain Sites No            [facility] PT Assessment - 05/15/15 0001    AROM   Cervical - Right Rotation 62  limited by pain in R cervical spine   Cervical - Left Rotation 54  limited by pain in R cervical spine   Strength   Overall Strength Deficits   Overall Strength Comments R shoulder ER 3+/5; R shoulder IR 4/5.   other    Findings Negative   Comment R Hawkins-Kennedy (subacromial impingement), R shoulder  [place] Adult PT Treatment/Exercise - 05/15/15 0001    Posture/Postural Control   Posture/Postural Control Postural limitations   Postural Limitations Rounded Shoulders;Forward head;Increased thoracic kyphosis   Posture Comments Noted mild improvement in cervical spine posture at rest (less lower cervical spine flexion/upper extension).   Self-Care   Self-Care Other Self-Care Comments   Other Self-Care Comments  Educated pt on self-massage of R SCM with effective return demonstration.   Exercises   Exercises Shoulder;Other Exercises   Other Exercises  Craniocervical flexion protocol (supine) with BP cuff. Pt able to complete 4  trials (22, 24, 26, and 28 mm Hg) but required cueing for motor control, to avoid excessive pushing/compensation.   Neck Exercises: Theraband   Scapula Retraction Limitations --   Neck Exercises: Supine   Other Supine Exercise Explained, demonstrated modified HEP. See Pt Instructions for details on all exercises/stetches, reps, sets, frequency, duration. Cueing provided throughout for proper alignment.   Shoulder Exercises: Standing   External Rotation Strengthening;Right;5 reps;Other (comment);Theraband  2 sets; significant difficulty noted   Theraband Level (Shoulder External Rotation) Level 2 (Red)   Internal Rotation Strengthening;Right;10 reps;Theraband   Theraband Level (Shoulder Internal Rotation) Level 2 (Red)   Shoulder Exercises: ROM/Strengthening   Other ROM/Strengthening Exercises B pectoralis major stretch using door frame x2 minutes bilat.   Manual Therapy   Manual therapy comments Suboccipital release x3 minutes to increase extensibility of suboccipital musculature, to improve postural alignment. Prolonged, passive stretch of R anterior, middle scalenes and R SCM x2 minutes each.                  PT Short Term Goals - 04/23/15 1100    PT SHORT TERM GOAL #1   Title Pt will perform home exercises with mod I using paper handout to indicate safe HEP compliance, to maximize functional gains made in PT. Target date: 05/21/15   PT SHORT TERM GOAL #2   Title Pt will verbalize understanding of ergonomic work station setup to promote postural normalization, mitigate symptoms associated with prolonged sitting at desk. Target date: 05/21/15   PT SHORT TERM GOAL #3   Title Pt will exhibit 60 degrees of cervical spine rotation AROM bilaterally with no increase in pain/symptoms to increase safety with driving. Target date: 05/21/15           PT Long Term Goals - 04/23/15 1110    PT LONG TERM GOAL #1   Title Pt will decrease Neck Disability Index score from 26% to 16% to  indicate significant improvement in QOL related to cervical spine pain/symptoms. Target date: 06/18/15   PT LONG TERM GOAL #2   Title Pt will perform Craniocervical Flexion Test for 3 consecutive increments to indicate pt ability to effectively utilize deep neck flexors. Target date: 06/18/15.   PT LONG TERM GOAL #3   Title Pt will exhibit 70 degrees of B active cervical spine rotation to indicate progress toward normalized ROM and increase safety with driving.  Target date: 06/18/15.   PT LONG TERM GOAL #4   Title Pt will exhibit 35 degrees of B active cervical spine lateral flexion to indicate progress toward normalized ROM. Target date: 06/18/15.   PT LONG TERM GOAL #5   Title Pt will participate in all above activities with no more than 2-point increase in pain/symptoms to indicate with no more than 2-point increase in within-session pain rating to indicate decreased impact of pain on functional activity tolerance. Target date: 06/18/15.  Plan - 05/15/15 1841    Clinical Impression Statement Pt reporting decreased "stiffness" and exhibiting improvement in cervical spine rotaiton AROM; however, pt continues to report pain in R lower cervical spine which radiates into superior R shoulder. Due to ongoing overuse of R upper trapezius, R levator scapulae, initiated scapular stabilization exercises today due to noted weakness in R shoulder ER > IR. Continue per POC.   Pt will benefit from skilled therapeutic intervention in order to improve on the following deficits Decreased endurance;Increased fascial restricitons;Hypomobility;Decreased strength;Decreased range of motion;Impaired flexibility;Improper body mechanics;Pain   Rehab Potential Good   PT Frequency Other (comment)  2x/week for 4 weeks then 1x/week for subsequent 4 weeks   PT Duration Other (comment)  See above   PT Treatment/Interventions Manual techniques;Therapeutic exercise;Therapeutic activities;Neuromuscular  re-education;Patient/family education;Moist Heat   PT Next Visit Plan R scapular stabilization exercises (consider adding to HEP). Check STG's.   Consulted and Agree with Plan of Care Patient        Problem List Patient Active Problem List   Diagnosis Date Noted  . Pleural effusion, bilateral 06/30/2014  . Shortness of breath 06/29/2014  . Hypertension 06/29/2014  . Pericarditis 06/29/2014  . Pericardial effusion 06/16/2014  . Pericardial effusion, acute 06/14/2014  . Cardiac/pericardial tamponade 06/11/2014  . Cardiac tamponade 06/11/2014  . Multiple sclerosis ([place]) 11/21/2013    Billie Ruddy, PT, [place] [address] [place], [place], [address] Phone: [phone]   Fax:  [phone] 05/15/2015, 6:43 PM

## 2015-05-16 ENCOUNTER — Telehealth: Payer: Self-pay | Admitting: Neurology

## 2015-05-16 NOTE — Telephone Encounter (Signed)
The patient has had blood work done through her primary care doctor on 05/11/2015. Chemistries and liver panel were unremarkable, CBC was unremarkable with a white count of 7.2, hemoglobin of 12.8, platelets of 207. Lymphocyte count was within normal limits. The patient is on Tysabri.

## 2015-05-17 ENCOUNTER — Ambulatory Visit: Payer: 59 | Admitting: Physical Therapy

## 2015-05-17 DIAGNOSIS — M436 Torticollis: Secondary | ICD-10-CM

## 2015-05-17 DIAGNOSIS — R293 Abnormal posture: Secondary | ICD-10-CM

## 2015-05-17 DIAGNOSIS — M542 Cervicalgia: Secondary | ICD-10-CM | POA: Diagnosis not present

## 2015-05-17 NOTE — Patient Instructions (Addendum)
Sternocleidomastoid (SCM) Stretch In sitting, place one hand over the front of your collarbone, then extend your head backwards and to each side. Hold for 30 seconds. Relax. Repeat. Do this 4 times times per day on the right side. Hold 20 Seconds Complete 4 Sets Perform 2 Time(s) a Day  Corner stretch for pectoralis major Hold for 20 seconds. Relax. Repeat. Do this 4-6 times. Perform 1-2 sets daily. Repeat 2 Times Hold 10 Seconds Perform 2 Time(s) a Day   Scapular Clocks (Middle trap/Lower trap) In seated with your arms by your sides, squeeze should blades together and downward (as pictured) and hold for 3 seconds. relax. Repeat. Perform 10 reps, 2 times per day. Hold 3 seconds. Perform 2 Time(s) a Day   Neck - Levator Scapula Stretch Sit in a chair and hold onto the underside of the seat with your right hand Rotate your head to the LEFT and take your LEFT hand and pull your head and nose toward your LEFT arm pit. Hold for 60 seconds, twice per day.   Upper Trapezius self-stretch Seated, bring ear to shoulder on same side; opposite arm by your side. Hold 30 seconds. Relax. Repeat. Perform 4 sets on each side, 2 times per day.   ELASTIC BAND SHOULDER EXTERNAL ROTATION - BOTH sides While holding your YELLOW band at your side with your elbow bent, start with your hand near your stomach and then pull the band away. Keep your elbow at your side the entire time. On RIGHT: perform 12 reps, 3 times per day. On LEFT: perform 8 reps, 3 times per day.  YOUR HOME PROGRAM ELASTIC BAND SHOULDER INTERNAL ROTATION - RIGHT side only While holding an elastic band at your side with your elbow bent, start with your hand away from your stomach, then pull the band towards your stomach. Keep your elbow near your side the entire time. Perform 12 reps, 3 times per day with the yellow band on the LEFT side only.

## 2015-05-18 NOTE — Therapy (Signed)
[facility] [address] [place], [place], [address] Phone: [phone]   Fax:  [phone]  Physical Therapy Treatment  Patient Details  Name: [name] MRN: [id number] Date of Birth: [birth year] Referring Provider:  Leighton Ruff, MD  Encounter Date: 05/17/2015      PT End of Session - 05/17/15 1858    Visit Number 7   Number of Visits 13   Date for PT Re-Evaluation 06/22/15   Authorization Type BC[employer]   PT Start Time [phone]  pt arrived late for session   PT Stop Time 0933   PT Time Calculation (min) 36 min   Activity Tolerance Patient tolerated treatment well;No increased pain   Behavior During Therapy [facility] for tasks assessed/performed      Past Medical History  Diagnosis Date  . Asthma   . Hypertension   . Depression   . Back pain   . Muscle pain   . Obesity   . Uveitis     Bilateral  . MS (multiple sclerosis)   . Cardiac/pericardial tamponade 06/11/2014    s/p pericardial window 06/16/2014 per Dr. Darcey Nora  . Pleural effusion, bilateral 06/30/2014    Past Surgical History  Procedure Laterality Date  . Uterine ablasion  2012  . Subxyphoid pericardial window N/A 06/16/2014    Procedure: SUBXYPHOID PERICARDIAL WINDOW;  Surgeon: Ivin Poot, MD;  Location: [facility] OR;  Service: Thoracic;  Laterality: N/A;  . Tee without cardioversion N/A 06/16/2014    Procedure: TRANSESOPHAGEAL ECHOCARDIOGRAM (TEE);  Surgeon: Ivin Poot, MD;  Location: [facility] OR;  Service: Thoracic;  Laterality: N/A;  . Pericardial tap N/A 06/11/2014    Procedure: PERICARDIAL TAP;  Surgeon: Blane Ohara, MD;  Location: [facility] CATH LAB;  Service: Cardiovascular;  Laterality: N/A;    There were no vitals filed for this visit.  Visit Diagnosis:  Cervicalgia  Stiffness of cervical spine  Abnormal posture      Subjective Assessment - 05/17/15 0859    Subjective Pt reports having "massaged my neck last night like you said" (self  massage of trigger points in SCM) and I feel a lot better this morning.   Pertinent History cervical spondylosis, relapsing remitting MS. asthma, depression, HTN, pericarditis (2015).   Limitations Other (comment)  turning head   Patient Stated Goals "I'd like to feel better, loosen up my neck, and to get some exercises for long term because this has been a chronic thing."   Currently in Pain? Yes   Pain Score 2    Pain Location Neck   Pain Orientation Right;Lower;Posterior   Pain Descriptors / Indicators Tightness   Pain Type Chronic pain   Pain Radiating Towards into upper R shoulder   Pain Onset More than a month ago   Pain Frequency Constant   Aggravating Factors  neck rotation; worsens as day progresses. Worst first thing the morning and later in the evening.   Pain Relieving Factors stretching   Effect of Pain on Daily Activities limits sleep   Multiple Pain Sites No            [facility] PT Assessment - 05/18/15 0001    Strength   Overall Strength Comments L shoulder ER 3+/5; L shoulder IR 4+/5                     [facility] Adult PT Treatment/Exercise - 05/17/15 0001    Exercises   Exercises Other Exercises   Other Exercises  Performed all  prior home exercises independently. See Pt Instructions for details on all exercises, frequency/duration, reps/sets. Wtih demonstration/cueing from this PT, performed B shoulder ER, R shoulder IR resisted by yellow Tband. See Pt Instructions for details.                   PT Short Term Goals - 05/17/15 0911    PT SHORT TERM GOAL #1   Title Pt will perform home exercises with mod I using paper handout to indicate safe HEP compliance, to maximize functional gains made in PT. Target date: 05/21/15   Baseline Met 10/6   Status Achieved   PT SHORT TERM GOAL #2   Title Pt will verbalize understanding of ergonomic work station setup to promote postural normalization, mitigate symptoms associated with prolonged sitting at  desk. Target date: 05/21/15   Baseline Met 10/6   Status Achieved   PT SHORT TERM GOAL #3   Title Pt will exhibit 60 degrees of cervical spine rotation AROM bilaterally with no increase in pain/symptoms to increase safety with driving. Target date: 05/21/15   Baseline 10/6: 75 degrees on R side; 54 degrees  on L side and pain-free   Status Partially Met           PT Long Term Goals - 04/23/15 1110    PT LONG TERM GOAL #1   Title Pt will decrease Neck Disability Index score from 26% to 16% to indicate significant improvement in QOL related to cervical spine pain/symptoms. Target date: 06/18/15   PT LONG TERM GOAL #2   Title Pt will perform Craniocervical Flexion Test for 3 consecutive increments to indicate pt ability to effectively utilize deep neck flexors. Target date: 06/18/15.   PT LONG TERM GOAL #3   Title Pt will exhibit 70 degrees of B active cervical spine rotation to indicate progress toward normalized ROM and increase safety with driving.  Target date: 06/18/15.   PT LONG TERM GOAL #4   Title Pt will exhibit 35 degrees of B active cervical spine lateral flexion to indicate progress toward normalized ROM. Target date: 06/18/15.   PT LONG TERM GOAL #5   Title Pt will participate in all above activities with no more than 2-point increase in pain/symptoms to indicate with no more than 2-point increase in within-session pain rating to indicate decreased impact of pain on functional activity tolerance. Target date: 06/18/15.               Plan - 05/18/15 0749    Clinical Impression Statement 2 of 3 STG's met and 1 STG partially met, indicating safety/compliance with HEP, improved awareness of posture/work station setup, and improvement in cervical spine rotation AROM. Continue per POC.   Pt will benefit from skilled therapeutic intervention in order to improve on the following deficits Decreased endurance;Increased fascial restricitons;Hypomobility;Decreased strength;Decreased  range of motion;Impaired flexibility;Improper body mechanics;Pain   Rehab Potential Good   PT Frequency Other (comment)  2x/week for 4 weeks then 1x/week for subsequent 4 weeks   PT Duration Other (comment)  See above   PT Treatment/Interventions Manual techniques;Therapeutic exercise;Therapeutic activities;Neuromuscular re-education;Patient/family education;Moist Heat   PT Next Visit Plan Check on pt performance of scapular stabilization exercises. Continue to address LTG's.   Consulted and Agree with Plan of Care Patient        Problem List Patient Active Problem List   Diagnosis Date Noted  . Pleural effusion, bilateral 06/30/2014  . Shortness of breath 06/29/2014  . Hypertension 06/29/2014  . Pericarditis 06/29/2014  .  Pericardial effusion 06/16/2014  . Pericardial effusion, acute 06/14/2014  . Cardiac/pericardial tamponade 06/11/2014  . Cardiac tamponade 06/11/2014  . Multiple sclerosis ([place]) 11/21/2013    Billie Ruddy, PT, [facility] [address] [place], [place], [address] Phone: [phone]   Fax:  [phone] 05/18/2015, 7:52 AM

## 2015-05-23 ENCOUNTER — Ambulatory Visit: Payer: 59 | Admitting: Physical Therapy

## 2015-05-25 ENCOUNTER — Ambulatory Visit: Payer: 59 | Admitting: Physical Therapy

## 2015-05-29 ENCOUNTER — Ambulatory Visit: Payer: 59 | Admitting: Physical Therapy

## 2015-05-29 DIAGNOSIS — M542 Cervicalgia: Secondary | ICD-10-CM | POA: Diagnosis not present

## 2015-05-29 DIAGNOSIS — M436 Torticollis: Secondary | ICD-10-CM

## 2015-05-29 DIAGNOSIS — R293 Abnormal posture: Secondary | ICD-10-CM

## 2015-05-29 NOTE — Therapy (Signed)
[facility] [address] [place], [place], [address] Phone: [phone]   Fax:  [phone]  Physical Therapy Treatment  Patient Details  Name: [name] MRN: [id number] Date of Birth: [birth year] No Data Recorded  Encounter Date: 05/29/2015      PT End of Session - 05/29/15 1921    Visit Number 8   Number of Visits 13   Date for PT Re-Evaluation 06/22/15   Authorization Type BCBS   PT Start Time 1884   PT Stop Time 1017   PT Time Calculation (min) 43 min   Activity Tolerance Patient tolerated treatment well;No increased pain   Behavior During Therapy [facility] for tasks assessed/performed      Past Medical History  Diagnosis Date  . Asthma   . Hypertension   . Depression   . Back pain   . Muscle pain   . Obesity   . Uveitis     Bilateral  . MS (multiple sclerosis)   . Cardiac/pericardial tamponade 06/11/2014    s/p pericardial window 06/16/2014 per Dr. Darcey Nora  . Pleural effusion, bilateral 06/30/2014    Past Surgical History  Procedure Laterality Date  . Uterine ablasion  2012  . Subxyphoid pericardial window N/A 06/16/2014    Procedure: SUBXYPHOID PERICARDIAL WINDOW;  Surgeon: Ivin Poot, MD;  Location: [facility] OR;  Service: Thoracic;  Laterality: N/A;  . Tee without cardioversion N/A 06/16/2014    Procedure: TRANSESOPHAGEAL ECHOCARDIOGRAM (TEE);  Surgeon: Ivin Poot, MD;  Location: [facility] OR;  Service: Thoracic;  Laterality: N/A;  . Pericardial tap N/A 06/11/2014    Procedure: PERICARDIAL TAP;  Surgeon: Blane Ohara, MD;  Location: [facility] CATH LAB;  Service: Cardiovascular;  Laterality: N/A;    There were no vitals filed for this visit.  Visit Diagnosis:  Cervicalgia  Stiffness of cervical spine  Abnormal posture      Subjective Assessment - 05/29/15 0939    Subjective Pt reports that "stiffness is better but pain is about the same." Pt also states, "I probably haven't done the stretches as  much as I should have since I was sick last week."   Pertinent History cervical spondylosis, relapsing remitting MS. asthma, depression, HTN, pericarditis (2015).   Limitations Other (comment)  turning head   Patient Stated Goals "I'd like to feel better, loosen up my neck, and to get some exercises for long term because this has been a chronic thing."   Currently in Pain? Yes   Pain Score 2    Pain Location Neck   Pain Orientation Right;Left;Posterior   Pain Descriptors / Indicators Aching;Sharp  "achy" at baseline and "sharp" if turns neck too far   Pain Radiating Towards into R shoulder   Pain Onset More than a month ago   Pain Frequency Constant   Aggravating Factors  neck rotation; worsens as the day progresses.    Pain Relieving Factors stretching   Effect of Pain on Daily Activities limits sleep   Multiple Pain Sites No            [facility] PT Assessment - 05/29/15 0001    AROM   Cervical - Right Rotation 59  painful   Cervical - Left Rotation 45  painful                     [facility] Adult PT Treatment/Exercise - 05/29/15 0001    Posture/Postural Control   Posture/Postural Control Postural limitations   Postural Limitations Rounded Shoulders;Forward  head;Increased thoracic kyphosis   Neck Exercises: Theraband   Shoulder External Rotation 10 reps;Other (comment)  yellow; 2 sets bilaterally   Other Theraband Exercises R shoulder IR resisted by yellow Tband x20 reps.   Shoulder Exercises: ROM/Strengthening   Other ROM/Strengthening Exercises Used door frame to stretch B pectoralis major 2 x 30-sec holds then pec minor 2 x30-sec holds per side.   Other ROM/Strengthening Exercises Sidelying: B sleeper stretch 4 x30-sec holds to increase extensibility of posterior GH capsule; cueing for technique. Added to HEP.   Shoulder Exercises: Stretch   Other Shoulder Stretches Supine with towel roll between scapulae: manual stretch of B pectorals 2 x30-second holds for  postural normalization.   Manual Therapy   Manual Therapy Myofascial release   Manual therapy comments Manual stretch of B anterior scalenes 2 x30-sec holds, middlle scalenes 2 x30-sec holds per side (4 min total.   Myofascial Release STM/MFR at R anterior, middle scalenes x3 minutes total.                PT Education - 05/29/15 1929    Education provided Yes   Education Details HEP: modified reps for scapular stabilization exercises. Added sleeper stretch for posterior GH capsule; see Pt Instructions.          PT Short Term Goals - 05/17/15 0911    PT SHORT TERM GOAL #1   Title Pt will perform home exercises with mod I using paper handout to indicate safe HEP compliance, to maximize functional gains made in PT. Target date: 05/21/15   Baseline Met 10/6   Status Achieved   PT SHORT TERM GOAL #2   Title Pt will verbalize understanding of ergonomic work station setup to promote postural normalization, mitigate symptoms associated with prolonged sitting at desk. Target date: 05/21/15   Baseline Met 10/6   Status Achieved   PT SHORT TERM GOAL #3   Title Pt will exhibit 60 degrees of cervical spine rotation AROM bilaterally with no increase in pain/symptoms to increase safety with driving. Target date: 05/21/15   Baseline 10/6: 75 degrees on R side; 54 degrees  on L side and pain-free   Status Partially Met           PT Long Term Goals - 04/23/15 1110    PT LONG TERM GOAL #1   Title Pt will decrease Neck Disability Index score from 26% to 16% to indicate significant improvement in QOL related to cervical spine pain/symptoms. Target date: 06/18/15   PT LONG TERM GOAL #2   Title Pt will perform Craniocervical Flexion Test for 3 consecutive increments to indicate pt ability to effectively utilize deep neck flexors. Target date: 06/18/15.   PT LONG TERM GOAL #3   Title Pt will exhibit 70 degrees of B active cervical spine rotation to indicate progress toward normalized ROM and  increase safety with driving.  Target date: 06/18/15.   PT LONG TERM GOAL #4   Title Pt will exhibit 35 degrees of B active cervical spine lateral flexion to indicate progress toward normalized ROM. Target date: 06/18/15.   PT LONG TERM GOAL #5   Title Pt will participate in all above activities with no more than 2-point increase in pain/symptoms to indicate with no more than 2-point increase in within-session pain rating to indicate decreased impact of pain on functional activity tolerance. Target date: 06/18/15.               Plan - 05/29/15 1926    Clinical Impression  Statement Session focused on 1) increasing extensibility of pectorals and posterior capsule of [place] joint to normalize postural alignment; 2) improving strength in scapular stabilization musculature to decrease overuse, tightness of upper trapezius (R > L).  Noted  decrease in cervical spine rotation ROM; suspect this is due to pt inactivity last week when ill. Conitnue per POC.   Pt will benefit from skilled therapeutic intervention in order to improve on the following deficits Decreased endurance;Increased fascial restricitons;Hypomobility;Decreased strength;Decreased range of motion;Impaired flexibility;Improper body mechanics;Pain   Rehab Potential Good   PT Frequency Other (comment)  2x/week for 4 weeks then 1x/week for subsequent 4 weeks   PT Duration Other (comment)  See above   PT Treatment/Interventions Manual techniques;Therapeutic exercise;Therapeutic activities;Neuromuscular re-education;Patient/family education;Moist Heat   PT Next Visit Plan Revisit craniocervical flexion protocol. Isometric strengthening of cervical spine.   Consulted and Agree with Plan of Care Patient        Problem List Patient Active Problem List   Diagnosis Date Noted  . Pleural effusion, bilateral 06/30/2014  . Shortness of breath 06/29/2014  . Hypertension 06/29/2014  . Pericarditis 06/29/2014  . Pericardial effusion 06/16/2014   . Pericardial effusion, acute 06/14/2014  . Cardiac/pericardial tamponade 06/11/2014  . Cardiac tamponade 06/11/2014  . Multiple sclerosis ([place]) 11/21/2013    Billie Ruddy, PT, [facility] [address] [place], [place], [address] Phone: [phone]   Fax:  [phone] 05/29/2015, 7:30 PM   Name: [name] MRN: [id number] Date of Birth: Mar 25, 1961

## 2015-05-29 NOTE — Patient Instructions (Addendum)
ELASTIC BAND SHOULDER EXTERNAL ROTATION - BOTH sides While holding your YELLOW band at your side with your elbow bent, start with your hand near your stomach and then pull the band away. Keep your elbow at your side the entire time. Perform 10 reps, 3 times per day on BOTH sides.  YOUR HOME PROGRAM ELASTIC BAND SHOULDER INTERNAL ROTATION - RIGHT side only While holding an elastic band at your side with your elbow bent, start with your hand away from your stomach, then pull the band towards your stomach. Keep your elbow near your side the entire time. Perform 20 reps, 3 times per day with the yellow band on the RIGHT side only.     Posterior Capsule Sleeper Stretch, Side-Lying    Lie on side, pillow under head, neck in neutral, underside arm in 90-90 of shoulder and elbow flexion with scapula fixed to table. Use other hand to press back of underside arm forward and downward. Keep elbow angle. Hold _30_ seconds.  Repeat _4__ times per day on each arm.

## 2015-06-05 ENCOUNTER — Ambulatory Visit: Payer: 59 | Admitting: Physical Therapy

## 2015-06-05 ENCOUNTER — Telehealth: Payer: Self-pay | Admitting: Physical Therapy

## 2015-06-05 NOTE — Telephone Encounter (Signed)
Called to notify patient of missed PT appt this morning at 8 am. No answer; left voicemail notifying patient of date/time of next PT session.  Billie Ruddy, PT, DPT [facility] [address] [place], [place], [address] Phone: [phone]   Fax:  [phone] 06/05/2015, 8:37 AM

## 2015-06-12 ENCOUNTER — Ambulatory Visit: Payer: 59 | Attending: Neurology | Admitting: Physical Therapy

## 2015-06-12 DIAGNOSIS — M436 Torticollis: Secondary | ICD-10-CM | POA: Diagnosis present

## 2015-06-12 DIAGNOSIS — M542 Cervicalgia: Secondary | ICD-10-CM | POA: Insufficient documentation

## 2015-06-12 DIAGNOSIS — R293 Abnormal posture: Secondary | ICD-10-CM | POA: Insufficient documentation

## 2015-06-12 NOTE — Therapy (Signed)
[facility] [address] [place], [place], [address] Phone: [phone]   Fax:  [phone]  Physical Therapy Treatment  Patient Details  Name: [name] MRN: [id number] Date of Birth: Mar 02, 1961 No Data Recorded  Encounter Date: 06/12/2015      PT End of Session - 06/12/15 2202    Visit Number 9   Number of Visits 13   Date for PT Re-Evaluation 07/12/15  requesting additional 4 weeks to complete sessions   Authorization Type BC[employer]   PT Start Time 0803   PT Stop Time 0845   PT Time Calculation (min) 42 min   Activity Tolerance Patient tolerated treatment well;No increased pain   Behavior During Therapy [facility] for tasks assessed/performed      Past Medical History  Diagnosis Date  . Asthma   . Hypertension   . Depression   . Back pain   . Muscle pain   . Obesity   . Uveitis     Bilateral  . MS (multiple sclerosis)   . Cardiac/pericardial tamponade 06/11/2014    s/p pericardial window 06/16/2014 per Dr. Darcey Nora  . Pleural effusion, bilateral 06/30/2014    Past Surgical History  Procedure Laterality Date  . Uterine ablasion  2012  . Subxyphoid pericardial window N/A 06/16/2014    Procedure: SUBXYPHOID PERICARDIAL WINDOW;  Surgeon: Ivin Poot, MD;  Location: [facility] OR;  Service: Thoracic;  Laterality: N/A;  . Tee without cardioversion N/A 06/16/2014    Procedure: TRANSESOPHAGEAL ECHOCARDIOGRAM (TEE);  Surgeon: Ivin Poot, MD;  Location: [facility] OR;  Service: Thoracic;  Laterality: N/A;  . Pericardial tap N/A 06/11/2014    Procedure: PERICARDIAL TAP;  Surgeon: Blane Ohara, MD;  Location: [facility] CATH LAB;  Service: Cardiovascular;  Laterality: N/A;    There were no vitals filed for this visit.  Visit Diagnosis:  Cervicalgia - Plan: PT plan of care cert/re-cert  Stiffness of cervical spine - Plan: PT plan of care cert/re-cert  Abnormal posture - Plan: PT plan of care cert/re-cert      Subjective  Assessment - 06/12/15 0807    Subjective Pt missed last 2 sessions due to having to go to [place], as brother had surgery and niece was in a car accident. Pt states, "The good thing is, my neck feels much better; I'm not sure if it's because I had time off work last week."   Pertinent History cervical spondylosis, relapsing remitting MS. asthma, depression, HTN, pericarditis (2015).   Limitations Other (comment)  turning head   Patient Stated Goals "I'd like to feel better, loosen up my neck, and to get some exercises for long term because this has been a chronic thing."   Currently in Pain? Yes   Pain Score 1    Pain Location Neck   Pain Orientation Right;Mid;Lower   Pain Descriptors / Indicators Other (Comment)  stiffness   Pain Type Chronic pain   Pain Onset More than a month ago   Pain Frequency Constant   Aggravating Factors  neck rotation; continues to worsen as the day progresses   Pain Relieving Factors stretching   Effect of Pain on Daily Activities limits sleep   Multiple Pain Sites No            [facility] PT Assessment - 06/12/15 0001    AROM   AROM Assessment Site Cervical   Cervical Flexion 31  discomfort in cervical spine (bilat)   Cervical Extension 42  concordant pain in R  lower cervical spine   Cervical - Right Side Bend 38   Cervical - Left Side Bend 29  discomfort R cervical spine   Cervical - Right Rotation 81   Cervical - Left Rotation 52                     [facility] Adult PT Treatment/Exercise - 06/12/15 0001    Posture/Postural Control   Posture/Postural Control Postural limitations   Postural Limitations Rounded Shoulders;Increased thoracic kyphosis;Forward head   Posture Comments Noted improvement in lower cervical flexon, upper cervical extension since PT evaluation.   Exercises   Exercises Other Exercises   Other Exercises  Supine, pt performed craniocervical flexion test x5 reps x10-sec holds at 2 mm Hg increments starting at 20 mm  Hg. Pt required tactile cueing to avoid compensation with R SCM. Also noted difficulty maintaining each position x10 seconds due to decreased muscular endurance in deep neck flexor muscles.    Manual Therapy   Manual Therapy Soft tissue mobilization;Myofascial release   Myofascial Release STM/MFR at R cervical spine x20 minutes total to address soft tissue restrictions/myofascial adhesionsin area of R anterior/middle scalene, transverse processes of C4-6.                PT Education - 06/12/15 0851    Education provided Yes   Education Details Craniocervical flexion vs. lower cervical flexion/upper cervical extension   Person(s) Educated Patient   Methods Explanation;Demonstration;Tactile cues;Verbal cues   Comprehension Verbalized understanding;Returned demonstration          PT Short Term Goals - 05/17/15 0911    PT SHORT TERM GOAL #1   Title Pt will perform home exercises with mod I using paper handout to indicate safe HEP compliance, to maximize functional gains made in PT. Target date: 05/21/15   Baseline Met 10/6   Status Achieved   PT SHORT TERM GOAL #2   Title Pt will verbalize understanding of ergonomic work station setup to promote postural normalization, mitigate symptoms associated with prolonged sitting at desk. Target date: 05/21/15   Baseline Met 10/6   Status Achieved   PT SHORT TERM GOAL #3   Title Pt will exhibit 60 degrees of cervical spine rotation AROM bilaterally with no increase in pain/symptoms to increase safety with driving. Target date: 05/21/15   Baseline 10/6: 75 degrees on R side; 54 degrees  on L side and pain-free   Status Partially Met           PT Long Term Goals - 06/12/15 2158    PT LONG TERM GOAL #1   Title Pt will decrease Neck Disability Index score from 26% to 16% to indicate significant improvement in QOL related to cervical spine pain/symptoms. Modified Target date: 07/10/15   Baseline --  Continue goal through renewed POC    Status On-going   PT LONG TERM GOAL #2   Title Pt will perform Craniocervical Flexion Test for 5 consecutive increments to indicate pt ability to effectively utilize deep neck flexors. Modified Target date: 07/10/15   Baseline Revised from 3 to 5 increments due to pt progress,  Continue goal through renewed POC   Status Revised   PT LONG TERM GOAL #3   Title Pt will exhibit 70 degrees of B active cervical spine rotation to indicate progress toward normalized ROM and increase safety with driving. Modified Target date: 07/10/15   Baseline 11/1: 81 degrees to R, 52 degrees to L.  Continue goal through renewed POC  Status Partially Met   PT LONG TERM GOAL #4   Title Pt will exhibit 35 degrees of B active cervical spine lateral flexion to indicate progress toward normalized ROM. Modified target date: 07/10/15   Baseline 11/1: 38 degrees to R, 29 degrees to L  Continue goal through renewed POC   Status Partially Met   PT LONG TERM GOAL #5   Title Pt will participate in all above activities with no more than 2-point increase in pain/symptoms to indicate with no more than 2-point increase in within-session pain rating to indicate decreased impact of pain on functional activity tolerance. Modified target date: 07/10/15   Baseline --  Continue goal through renewed POC   Status On-going               Plan - 06/12/15 2203    Clinical Impression Statement Since beginning this episode of PT, pt has demonstrated improved postural awareness/alignment, improved cervical spine AROM, and decreased pain. PT appts missed due to pt illness and famiily emergency; therefore,, requesting 4 additional weeks to complete remaining PT sessions.  Pt will benefit from skilled outpatient PT 1x/week for 4 weeks to continue to improve postural alignment, cervical spine AROM, decrease cervical spine pain, and improve QoL.   Pt will benefit from skilled therapeutic intervention in order to improve on the following  deficits Decreased endurance;Increased fascial restricitons;Hypomobility;Decreased strength;Decreased range of motion;Impaired flexibility;Improper body mechanics;Pain   Rehab Potential Good   PT Frequency 1x / week   PT Duration 4 weeks   PT Treatment/Interventions Manual techniques;Therapeutic exercise;Therapeutic activities;Neuromuscular re-education;Patient/family education;Moist Heat   PT Next Visit Plan Isometric strengthening of cervical spine.   Consulted and Agree with Plan of Care Patient        Problem List Patient Active Problem List   Diagnosis Date Noted  . Pleural effusion, bilateral 06/30/2014  . Shortness of breath 06/29/2014  . Hypertension 06/29/2014  . Pericarditis 06/29/2014  . Pericardial effusion 06/16/2014  . Pericardial effusion, acute 06/14/2014  . Cardiac/pericardial tamponade 06/11/2014  . Cardiac tamponade 06/11/2014  . Multiple sclerosis ([place]) 11/21/2013    Billie Ruddy, PT, [facility] [address] [place], [place], [address] Phone: [phone]   Fax:  [phone] 06/12/2015, 10:14 PM   Name: [name] MRN: [id number] Date of Birth: 07-30-1961

## 2015-07-01 IMAGING — CT CT ANGIO CHEST
2 of 9 series · 18 of 46 positions shown · IV contrast (omnipaque)
Comparison: 06/14/2014

CLINICAL DATA: Chest pain and shortness of breath. Recent
pericardial effusion.

EXAM:
CT ANGIOGRAPHY CHEST WITH CONTRAST
TECHNIQUE: Multidetector CT imaging of the chest was performed using the
standard protocol during bolus administration of intravenous
contrast. Multiplanar CT image reconstructions and MIPs were
obtained to evaluate the vascular anatomy.
CONTRAST:  100mL OMNIPAQUE IOHEXOL 350 MG/ML SOLN

[Series 5: thins · axial · 0.69mm/px · z∈[-268,-10]mm · 15 of 292 slices shown]
[im 17/292  lung]
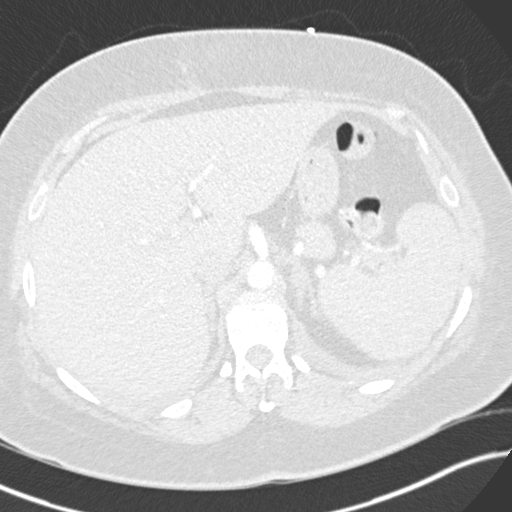
[im 33/292  soft-tissue]
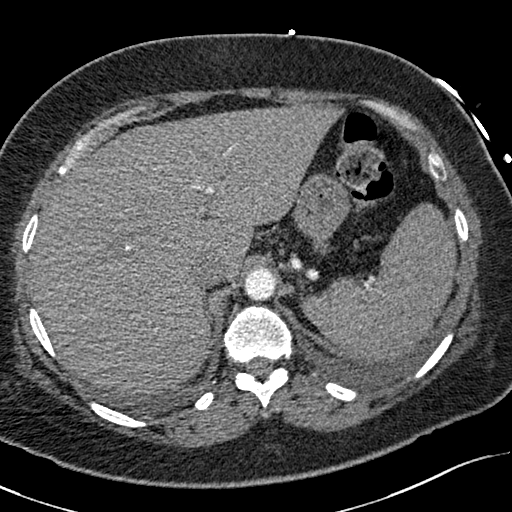
[im 49/292  lung]
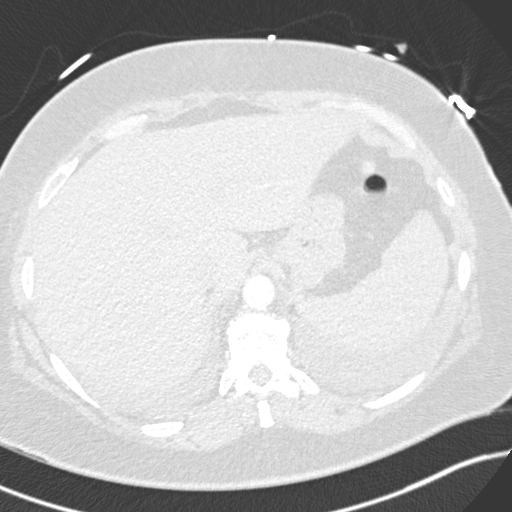
[im 65/292  soft-tissue]
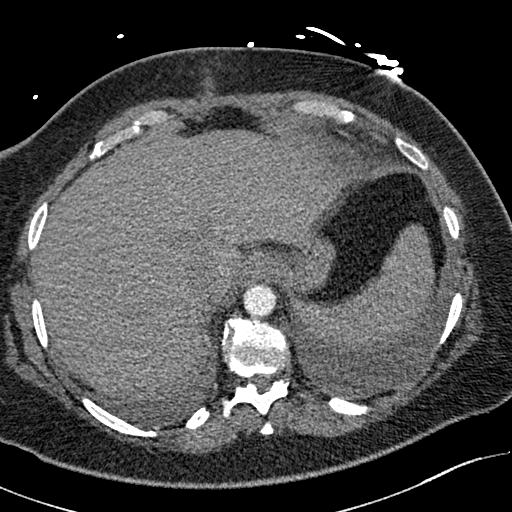
[im 98/292  lung]
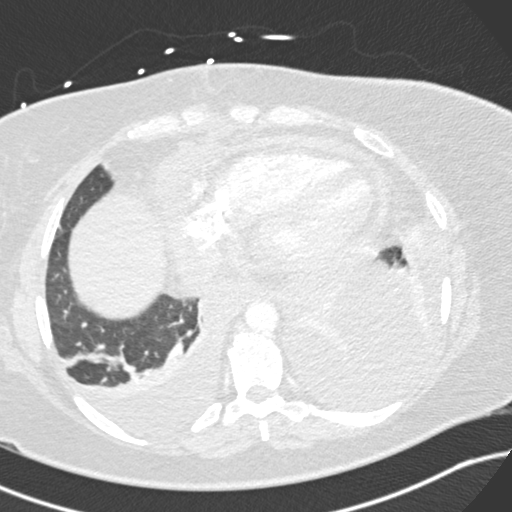
[im 114/292  soft-tissue]
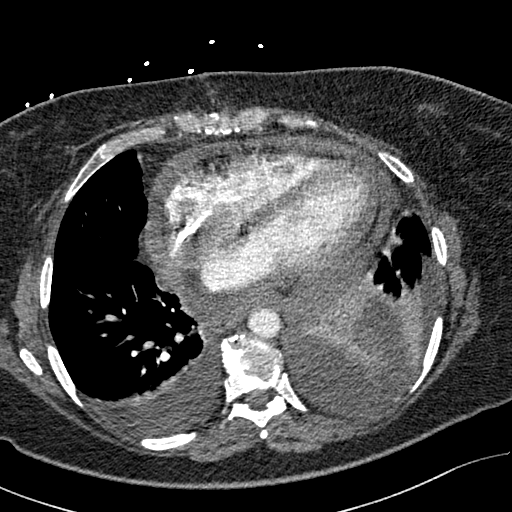
[im 130/292  lung]
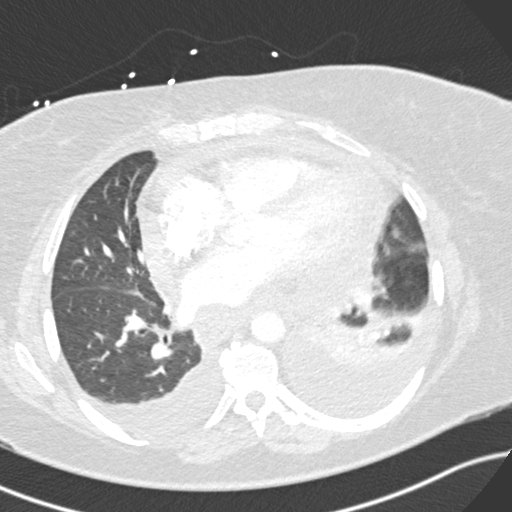
[im 146/292  soft-tissue]
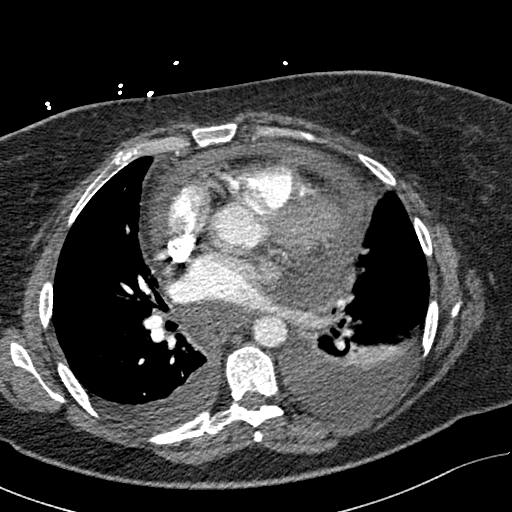
[im 162/292  lung]
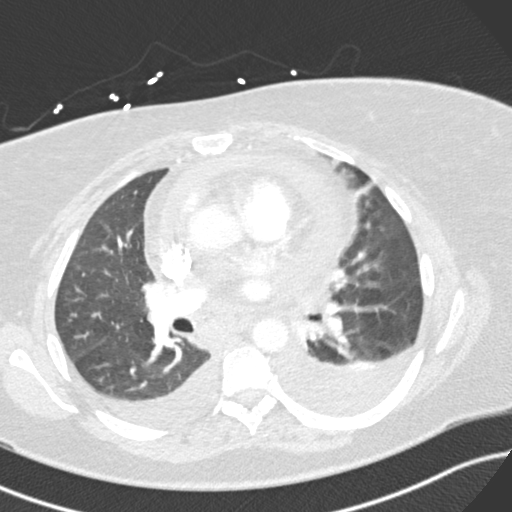
[im 178/292  soft-tissue]
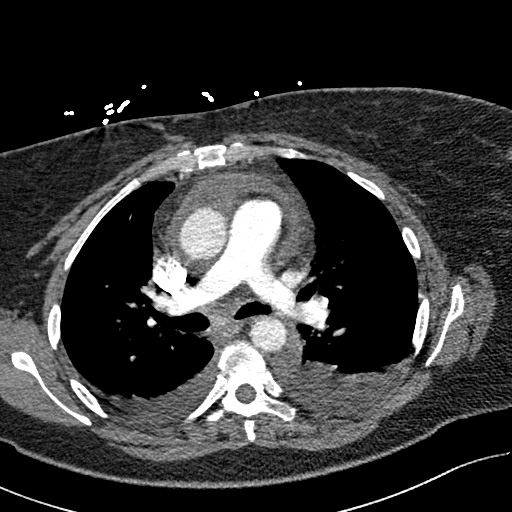
[im 195/292  lung]
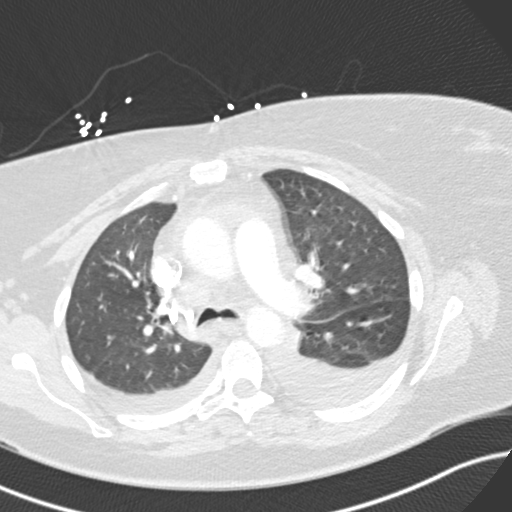
[im 227/292  soft-tissue]
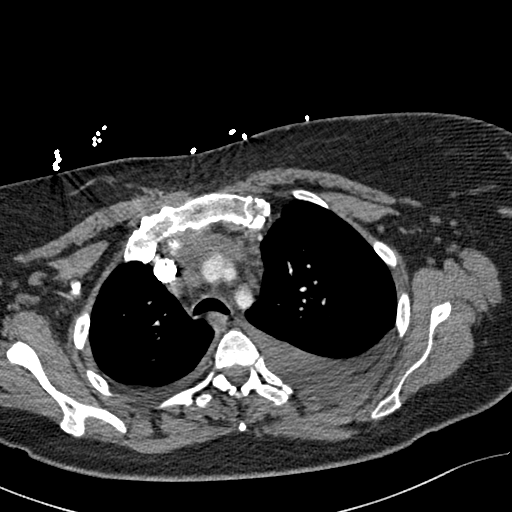
[im 243/292  lung]
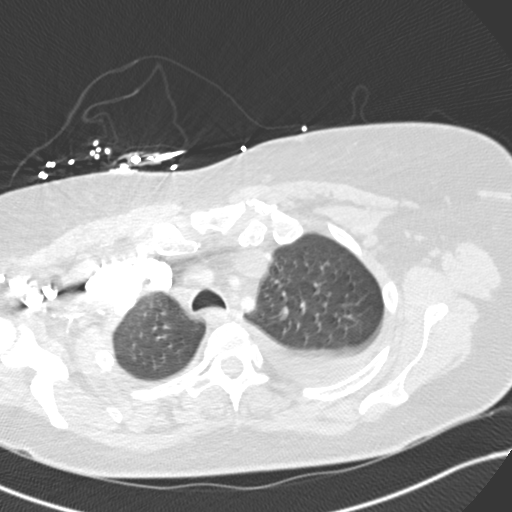
[im 259/292  soft-tissue]
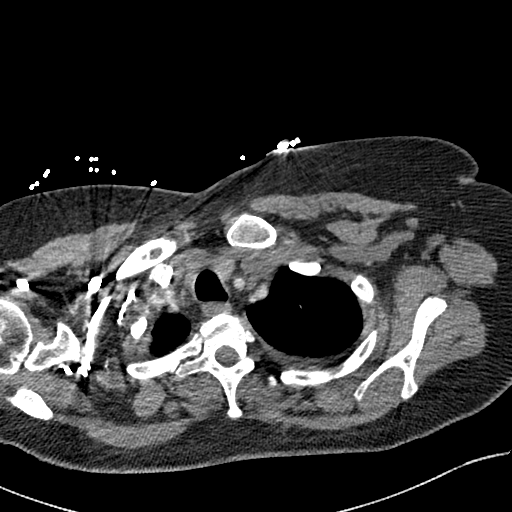
[im 275/292  lung]
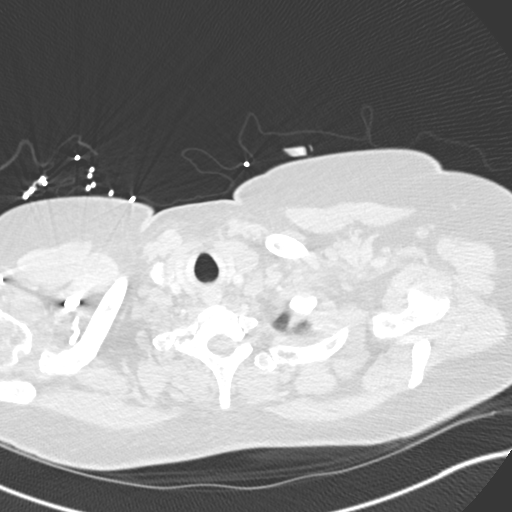

[Series 7: coronal mpr · coronal · 0.59mm/px · 3 of 143 slices shown]
[im 36/143  soft-tissue]
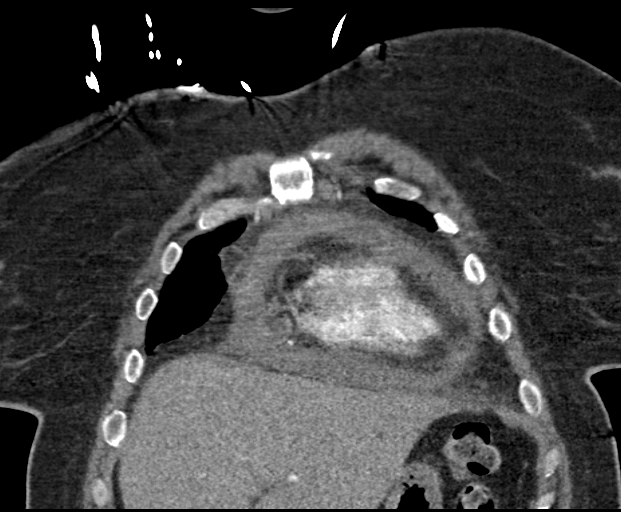
[im 72/143  soft-tissue]
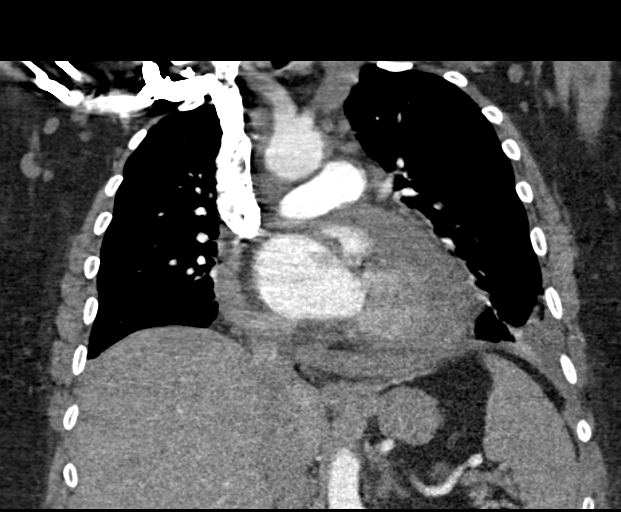
[im 107/143  soft-tissue]
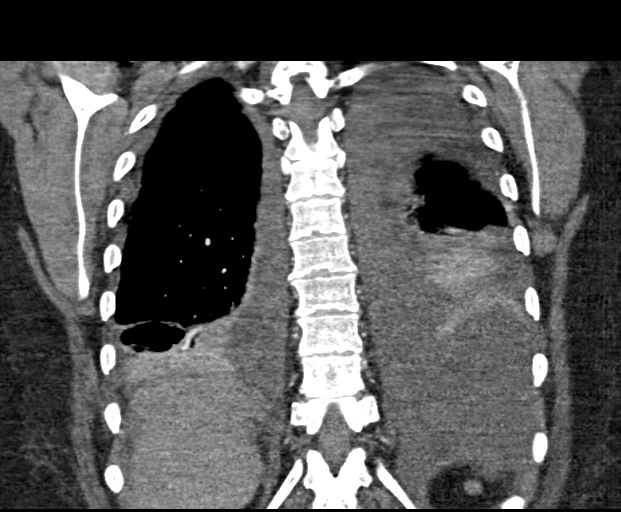

[18 of 46 positions shown; findings below may reference images not displayed]

FINDINGS: Technically adequate study with moderately good opacification of the
central and segmental pulmonary arteries. No focal filling defects.
No evidence of significant pulmonary embolus. Normal heart size.
Moderate pericardial effusion. Normal caliber thoracic aorta. No
dissection. The great vessel origins are patent. Mild prominence of
mediastinal and axillary lymph nodes. Largest lymph node is present
in the right paratracheal/pretracheal region and measures 19 mm
short axis dimension. This is slightly enlarged since previous
study. Lymph nodes are nonspecific and could be reactive nodes
although lymphoma or metastasis is not entirely excluded. Esophagus
is decompressed.

Moderate bilateral pleural effusions, greater on the left. These are
mildly increased since previous study. Atelectasis in the lung bases
also greater on the left. Also increased since prior study. No
pneumothorax.

Included portions of the upper abdominal organs are unremarkable. No
destructive bone lesions. Degenerative changes in the lumbar spine.

Review of the MIP images confirms the above findings.
IMPRESSION: No evidence of significant pulmonary embolus. Again demonstrated is
a moderate-sized pericardial effusion. Moderate bilateral pleural
effusions and basilar atelectasis, greater on the left and mildly
increased since previous study. Increasing mediastinal
lymphadenopathy, probably reactive.

## 2015-07-12 ENCOUNTER — Other Ambulatory Visit: Payer: Self-pay | Admitting: Neurology

## 2015-09-04 ENCOUNTER — Encounter: Payer: Self-pay | Admitting: Physical Therapy

## 2015-09-04 NOTE — Therapy (Signed)
[facility] [address] [place], [place], [address] Phone: [phone]   Fax:  [phone]  Patient Details  Name: [name] MRN: [id number] Date of Birth: 07-May-1961 Referring Provider:  Margette Fast, MD  Encounter Date: 09/04/2015  PHYSICAL THERAPY DISCHARGE SUMMARY  Visits from Start of Care: 9  Current functional level related to goals / functional outcomes: Unknown, as patient did not return to PT after 9th session. See below for goals.      PT Long Term Goals - 06/12/15 2158    PT LONG TERM GOAL #1   Title Pt will decrease Neck Disability Index score from 26% to 16% to indicate significant improvement in QOL related to cervical spine pain/symptoms. Modified Target date: 07/10/15   Baseline --  Continue goal through renewed POC   Status On-going   PT LONG TERM GOAL #2   Title Pt will perform Craniocervical Flexion Test for 5 consecutive increments to indicate pt ability to effectively utilize deep neck flexors. Modified Target date: 07/10/15   Baseline Revised from 3 to 5 increments due to pt progress,  Continue goal through renewed POC   Status Revised   PT LONG TERM GOAL #3   Title Pt will exhibit 70 degrees of B active cervical spine rotation to indicate progress toward normalized ROM and increase safety with driving. Modified Target date: 07/10/15   Baseline 11/1: 81 degrees to R, 52 degrees to L.  Continue goal through renewed POC   Status Partially Met   PT LONG TERM GOAL #4   Title Pt will exhibit 35 degrees of B active cervical spine lateral flexion to indicate progress toward normalized ROM. Modified target date: 07/10/15   Baseline 11/1: 38 degrees to R, 29 degrees to L  Continue goal through renewed POC   Status Partially Met   PT LONG TERM GOAL #5   Title Pt will participate in all above activities with no more than 2-point increase in pain/symptoms to indicate with no more than 2-point  increase in within-session pain rating to indicate decreased impact of pain on functional activity tolerance. Modified target date: 07/10/15   Baseline --  Continue goal through renewed POC   Status On-going      Remaining deficits: Unknown, as patient did not return to PT after last session.   Education / Equipment: HEP; Economist, posture, work Community education officer:                                                    Patient goals were not met. Patient is being discharged due to not returning since the last visit.  ?????       Billie Ruddy, PT, DPT [facility] [address] [place], [place], [address] Phone: [phone]   Fax:  [phone] 09/04/2015, 10:03 AM

## 2015-09-10 ENCOUNTER — Telehealth: Payer: Self-pay

## 2015-09-10 ENCOUNTER — Ambulatory Visit: Payer: 59 | Admitting: Cardiovascular Disease

## 2015-09-10 ENCOUNTER — Encounter: Payer: Self-pay | Admitting: Adult Health

## 2015-09-10 ENCOUNTER — Ambulatory Visit (INDEPENDENT_AMBULATORY_CARE_PROVIDER_SITE_OTHER): Payer: 59 | Admitting: Adult Health

## 2015-09-10 VITALS — BP 108/74 | HR 65 | Ht 70.0 in | Wt 263.5 lb

## 2015-09-10 DIAGNOSIS — G35 Multiple sclerosis: Secondary | ICD-10-CM | POA: Diagnosis not present

## 2015-09-10 DIAGNOSIS — Z5181 Encounter for therapeutic drug level monitoring: Secondary | ICD-10-CM

## 2015-09-10 DIAGNOSIS — R200 Anesthesia of skin: Secondary | ICD-10-CM | POA: Diagnosis not present

## 2015-09-10 NOTE — Progress Notes (Signed)
PATIENT: [name] DOB: Jan 04, 1961  REASON FOR VISIT: follow up-  Multiple sclerosis HISTORY FROM: patient  HISTORY OF PRESENT ILLNESS:  [name] is a 55 year old female with a history of multiple sclerosis. She returns today for follow-up. The patient continues to take Tysabri and tolerates it well.  At the last visit the patient was complaining of numbness in both hands. She began using wrist splints which she feels has been beneficial. She also went to neuromuscular therapy and feels that the exercises have helped as well. She also thinks that if she gets regular massages this will help the tension in her neck. The patient denies any new numbness or weakness. Denies any changes with her gait or balance. Denies any changes with the bowels or bladder. She continues to have intermittent urinary urgency. The patient denies any significant changes with the vision. She states in the last week her vision may be a little more blurry but this has been consistent with her diagnosis of uveitis. Overall she feels that she is doing well. She returns today for an evaluation.  HISTORY 03/08/15: [name] is a 55 year old right-handed white female with a history of multiple sclerosis. The patient has been on Tysabri, she is tolerating the medication well. She has developed some intermittent numbness in both hands over the last 3 or 4 months. She will wake up with the numbness in the hands, she may note numbness while driving. She will shake out the hands, and the numbness will improve. She also has some neck and shoulder discomfort that is chronic in nature, this was present last year. She engaged in physical therapy which was helpful, but she was unable to complete the therapy secondary to her pericarditis. The patient denies any other new symptoms such is weakness or gait instability or difficulty controlling the bowels or the bladder. She does report some urgency of the bladder. No new visual complaints have  been noted. She just completed MRI evaluation of the brain and cervical spine on 02/28/2015. 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 She has mild spondylosis. The patient reports crepitus in the neck when turning the head. She returns to this office for an evaluation.  REVIEW OF SYSTEMS: Out of a complete 14 system review of symptoms, the patient complains only of the following symptoms, and all other reviewed systems are negative.   cough, restless leg, insomnia, back pain, neck pain, neck stiffness, depression  ALLERGIES: Allergies  Allergen Reactions  . Penicillins Rash    HOME MEDICATIONS: Outpatient Prescriptions Prior to Visit  Medication Sig Dispense Refill  . Armodafinil (NUVIGIL) 150 MG tablet Take 1 tablet (150 mg total) by mouth daily. 30 tablet 5  . doxepin (SINEQUAN) 50 MG capsule TAKE 1 CAPSULE BY MOUTH AT  BEDTIME 90 capsule 1  . escitalopram (LEXAPRO) 10 MG tablet Take 10 mg by mouth daily.    Marland Kitchen ibuprofen (ADVIL,MOTRIN) 200 MG tablet Take 400 mg by mouth every 6 (six) hours as needed.    . natalizumab 300 mg in sodium chloride 0.9 % 100 mL Inject 300 mg into the vein every 28 (twenty-eight) days.    Marland Kitchen lisinopril (PRINIVIL,ZESTRIL) 10 MG tablet Take 10 mg by mouth daily.     . prednisoLONE acetate (PRED FORTE) 1 % ophthalmic suspension Place 1 drop into both eyes as needed.     No facility-administered medications prior to visit.    PAST MEDICAL HISTORY:  Past Medical History  Diagnosis Date  . Asthma   . Hypertension   . Depression   . Back pain   . Muscle pain   . Obesity   . Uveitis     Bilateral  . MS (multiple sclerosis) ([place])   . Cardiac/pericardial tamponade 06/11/2014    s/p pericardial window 06/16/2014 per Dr. Darcey Nora  . Pleural effusion, bilateral 06/30/2014    PAST SURGICAL HISTORY: Past Surgical History  Procedure Laterality Date  . Uterine ablasion  2012  . Subxyphoid pericardial window  N/A 06/16/2014    Procedure: SUBXYPHOID PERICARDIAL WINDOW;  Surgeon: Ivin Poot, MD;  Location: [facility] OR;  Service: Thoracic;  Laterality: N/A;  . Tee without cardioversion N/A 06/16/2014    Procedure: TRANSESOPHAGEAL ECHOCARDIOGRAM (TEE);  Surgeon: Ivin Poot, MD;  Location: [facility] OR;  Service: Thoracic;  Laterality: N/A;  . Pericardial tap N/A 06/11/2014    Procedure: PERICARDIAL TAP;  Surgeon: Blane Ohara, MD;  Location: [facility] CATH LAB;  Service: Cardiovascular;  Laterality: N/A;    FAMILY HISTORY: Family History  Problem Relation Age of Onset  . Breast cancer Mother   . Hypertension Mother   . Diabetes Mother     SOCIAL HISTORY: Social History   Social History  . Marital Status: Single    Spouse Name: N/A  . Number of Children: 0  . Years of Education: MA   Occupational History  . [employer]    Social History Main Topics  . Smoking status: Never Smoker   . Smokeless tobacco: Never Used  . Alcohol Use: 0.0 oz/week    0 Standard drinks or equivalent per week     Comment: occasional  . Drug Use: No     Comment: marijuana  . Sexual Activity: Not Currently   Other Topics Concern  . Not on file   Social History Narrative   Patient lives at home with her wife [name])    Patient works full time.   Arboriculturist.   Right handed.   Caffeine two cups daily.      PHYSICAL EXAM  Filed Vitals:   09/10/15 1000  BP: 108/74  Pulse: 65  Height: 5\' 10"  (1.778 m)  Weight: 263 lb 8 oz (119.523 kg)   Body mass index is 37.81 kg/(m^2).  Generalized: Well developed, in no acute distress   Neurological examination  Mentation: Alert oriented to time, place, history taking. Follows all commands speech and language fluent Cranial nerve II-XII: Pupils were equal round reactive to light. Extraocular movements were full, visual field were full on confrontational test. Facial sensation and strength were normal. Uvula tongue midline. Head turning and  shoulder shrug  were normal and symmetric. Motor: The motor testing reveals 5 over 5 strength of all 4 extremities. Good symmetric motor tone is noted throughout.  Sensory: Sensory testing is intact to soft touch on all 4 extremities although slightly decreased on the left side.. No evidence of extinction is noted.  Coordination: Cerebellar testing reveals good finger-nose-finger and heel-to-shin bilaterally.  Gait and station: Gait is normal. Tandem gait is normal. Romberg is negative. No drift is seen.  Reflexes: Deep tendon reflexes are symmetric and normal bilaterally.   DIAGNOSTIC DATA (LABS, IMAGING, TESTING) - I reviewed patient records, labs, notes, testing and imaging myself where available.  Lab Results  Component Value Date   WBC 7.1 03/08/2015   HGB 12.8 11/22/2014   HCT 41.8 03/08/2015   MCV 89 03/08/2015   PLT  228 03/08/2015      Component Value Date/Time   NA 140 03/08/2015 1311   NA 136 07/10/2014 0944   K 5.1 03/08/2015 1311   CL 100 03/08/2015 1311   CO2 24 03/08/2015 1311   GLUCOSE 98 03/08/2015 1311   GLUCOSE 84 07/10/2014 0944   BUN 17 03/08/2015 1311   BUN 14 07/10/2014 0944   CREATININE 0.75 03/08/2015 1311   CREATININE 0.76 06/27/2014 0915   CALCIUM 9.9 03/08/2015 1311   PROT 7.6 03/08/2015 1311   PROT 7.0 06/18/2014 0300   ALBUMIN 4.7 03/08/2015 1311   ALBUMIN 2.6* 06/18/2014 0300   AST 21 03/08/2015 1311   ALT 22 03/08/2015 1311   ALKPHOS 123* 03/08/2015 1311   BILITOT 0.4 03/08/2015 1311   BILITOT <0.2* 06/18/2014 0300   GFRNONAA 93 03/08/2015 1311   GFRAA 107 03/08/2015 1311     ASSESSMENT AND PLAN 55 y.o. year old female  has a past medical history of Asthma; Hypertension; Depression; Back pain; Muscle pain; Obesity; Uveitis; MS (multiple sclerosis) ([place]); Cardiac/pericardial tamponade (06/11/2014); and Pleural effusion, bilateral (06/30/2014). here with:   1. Multiple sclerosis 2. Numbness  in the hands bilaterally   Overall the patient  has remained stable. She will continue using wrist splints during her exercises. She does not want to  proceed with any further testing for the numbness in her hands at this time. Patient advised that if his symptoms worsen she should let us know. She will continue on Tysabri.  I will check blood work today. If her symptoms worsen or she develops new symptoms she should let us know. She will follow-up in 3 months or sooner if needed.     Ward Givens, MSN, NP-C 09/10/2015, 10:18 AM [facility] [address], [place], [place] [address] [phone]

## 2015-09-10 NOTE — Patient Instructions (Signed)
Continue Tysabri Blood work today If your symptoms worsen or you develop new symptoms please let us know.   

## 2015-09-10 NOTE — Progress Notes (Signed)
I have read the note, and I agree with the clinical assessment and plan.  [name],[name]   

## 2015-09-10 NOTE — Telephone Encounter (Signed)
Was unable to locate patient using Cover My Meds. Called number provided on letter to complete PA. Armodafinil 150mg :  Case id/approval# [id number]; valid for 1 year; expires 09/09/2016.

## 2015-09-11 ENCOUNTER — Telehealth: Payer: Self-pay

## 2015-09-11 LAB — CBC WITH DIFFERENTIAL/PLATELET
Basophils Absolute: 0 10*3/uL (ref 0.0–0.2)
Basos: 0 %
EOS (ABSOLUTE): 0.2 10*3/uL (ref 0.0–0.4)
Eos: 3 %
HEMATOCRIT: 38.5 % (ref 34.0–46.6)
Hemoglobin: 13.1 g/dL (ref 11.1–15.9)
Immature Grans (Abs): 0 10*3/uL (ref 0.0–0.1)
Immature Granulocytes: 0 %
LYMPHS ABS: 3.9 10*3/uL — AB (ref 0.7–3.1)
Lymphs: 47 %
MCH: 29.5 pg (ref 26.6–33.0)
MCHC: 34 g/dL (ref 31.5–35.7)
MCV: 87 fL (ref 79–97)
MONOS ABS: 0.7 10*3/uL (ref 0.1–0.9)
Monocytes: 8 %
NEUTROS ABS: 3.5 10*3/uL (ref 1.4–7.0)
Neutrophils: 42 %
Platelets: 225 10*3/uL (ref 150–379)
RBC: 4.44 x10E6/uL (ref 3.77–5.28)
RDW: 14.2 % (ref 12.3–15.4)
WBC: 8.3 10*3/uL (ref 3.4–10.8)

## 2015-09-11 LAB — COMPREHENSIVE METABOLIC PANEL
ALBUMIN: 4.1 g/dL (ref 3.5–5.5)
ALK PHOS: 113 IU/L (ref 39–117)
ALT: 13 IU/L (ref 0–32)
AST: 16 IU/L (ref 0–40)
Albumin/Globulin Ratio: 1.5 (ref 1.1–2.5)
BILIRUBIN TOTAL: 0.2 mg/dL (ref 0.0–1.2)
BUN / CREAT RATIO: 30 — AB (ref 9–23)
BUN: 20 mg/dL (ref 6–24)
CHLORIDE: 99 mmol/L (ref 96–106)
CO2: 24 mmol/L (ref 18–29)
Calcium: 9.4 mg/dL (ref 8.7–10.2)
Creatinine, Ser: 0.67 mg/dL (ref 0.57–1.00)
GFR calc non Af Amer: 101 mL/min/{1.73_m2} (ref >59)
GFR, EST AFRICAN AMERICAN: 117 mL/min/{1.73_m2} (ref >59)
GLOBULIN, TOTAL: 2.8 g/dL (ref 1.5–4.5)
Glucose: 85 mg/dL (ref 65–99)
Potassium: 4.3 mmol/L (ref 3.5–5.2)
SODIUM: 139 mmol/L (ref 134–144)
Total Protein: 6.9 g/dL (ref 6.0–8.5)

## 2015-09-11 NOTE — Telephone Encounter (Signed)
-----   Message from Ward Givens, NP sent at 09/11/2015  8:13 AM EST ----- Lab work ok. Waiting on JCV antivirus. Please call patient.

## 2015-09-11 NOTE — Telephone Encounter (Signed)
Called patient. Gave lab results. Patient verbalized understanding.  

## 2015-09-17 ENCOUNTER — Telehealth: Payer: Self-pay | Admitting: Adult Health

## 2015-09-17 NOTE — Telephone Encounter (Signed)
Spoke to [place] she relayed JCV Negative . Called patient and left her a message asking her to call me.

## 2015-09-18 NOTE — Telephone Encounter (Signed)
Pt called and would like a call back about her lab results. Thank you

## 2015-09-18 NOTE — Telephone Encounter (Signed)
Patient called back and spoke with her relayed JC Virus was negative. Patient understood her results.

## 2015-10-06 ENCOUNTER — Other Ambulatory Visit: Payer: Self-pay | Admitting: Neurology

## 2015-10-08 ENCOUNTER — Encounter: Payer: Self-pay | Admitting: *Deleted

## 2015-10-15 ENCOUNTER — Ambulatory Visit (INDEPENDENT_AMBULATORY_CARE_PROVIDER_SITE_OTHER): Payer: 59 | Admitting: Cardiovascular Disease

## 2015-10-15 ENCOUNTER — Encounter: Payer: Self-pay | Admitting: Cardiovascular Disease

## 2015-10-15 VITALS — BP 112/60 | HR 59 | Ht 70.0 in | Wt 265.1 lb

## 2015-10-15 DIAGNOSIS — I319 Disease of pericardium, unspecified: Secondary | ICD-10-CM | POA: Diagnosis not present

## 2015-10-15 DIAGNOSIS — I3139 Other pericardial effusion (noninflammatory): Secondary | ICD-10-CM

## 2015-10-15 DIAGNOSIS — I313 Pericardial effusion (noninflammatory): Secondary | ICD-10-CM

## 2015-10-15 DIAGNOSIS — I10 Essential (primary) hypertension: Secondary | ICD-10-CM | POA: Diagnosis not present

## 2015-10-15 NOTE — Patient Instructions (Signed)
Dr. Croitoru recommends that you schedule a follow-up appointment in: ONE YEAR   

## 2015-10-15 NOTE — Progress Notes (Signed)
Patient ID: [name], female   DOB: 06-23-1961, 55 y.o.   MRN: [id number]    Cardiology Office Note    Date:  10/15/2015   ID:  [name], DOB Mar 18, 1961, MRN [id number]  PCP:  Haywood Pao, MD  Cardiologist:   Sanda Klein, MD   Chief Complaint  Patient presents with  . Annual Exam    patient reports no complaints    History of Present Illness:  [name] is a 55 y.o. female with a history of pericardial effusion from acute pericarditis, requiring pericardial window in November 2015 returns for follow-up for hypertension. She has not had any new health problems since her last appointment. She has multiple sclerosis but has not had new attacks. She feels well and denies shortness of breath, chest pain, dizziness/lightheadedness, palpitations, syncope, leg edema or focal neurological events.  Has normal left ventricular systolic function and no real evidence of structural heart disease. She has an aortic ejection murmur without significant aortic valve abnormalities.  Past Medical History  Diagnosis Date  . Asthma   . Hypertension   . Depression   . Back pain   . Muscle pain   . Obesity   . Uveitis     Bilateral  . MS (multiple sclerosis) ([place])   . Cardiac/pericardial tamponade 06/11/2014    s/p pericardial window 06/16/2014 per Dr. Darcey Nora  . Pleural effusion, bilateral 06/30/2014    Past Surgical History  Procedure Laterality Date  . Uterine ablasion  2012  . Subxyphoid pericardial window N/A 06/16/2014    Procedure: SUBXYPHOID PERICARDIAL WINDOW;  Surgeon: Ivin Poot, MD;  Location: [facility] OR;  Service: Thoracic;  Laterality: N/A;  . Tee without cardioversion N/A 06/16/2014    Procedure: TRANSESOPHAGEAL ECHOCARDIOGRAM (TEE);  Surgeon: Ivin Poot, MD;  Location: [facility] OR;  Service: Thoracic;  Laterality: N/A;  . Pericardial tap N/A 06/11/2014    Procedure: PERICARDIAL TAP;  Surgeon: Blane Ohara, MD;  Location: [facility] C[facility] LAB;  Service: Cardiovascular;   Laterality: N/A;    Outpatient Prescriptions Prior to Visit  Medication Sig Dispense Refill  . Armodafinil 150 MG tablet TAKE 1 TABLET BY MOUTH EVERY DAY 30 tablet 5  . doxepin (SINEQUAN) 50 MG capsule TAKE 1 CAPSULE BY MOUTH AT  BEDTIME 90 capsule 1  . escitalopram (LEXAPRO) 10 MG tablet Take 10 mg by mouth daily.    Marland Kitchen ibuprofen (ADVIL,MOTRIN) 200 MG tablet Take 400 mg by mouth every 6 (six) hours as needed.    Marland Kitchen losartan (COZAAR) 50 MG tablet Take 1 tablet by mouth daily.    . natalizumab 300 mg in sodium chloride 0.9 % 100 mL Inject 300 mg into the vein every 28 (twenty-eight) days.     No facility-administered medications prior to visit.     Allergies:   Penicillins   Social History   Social History  . Marital Status: Single    Spouse Name: N/A  . Number of Children: 0  . Years of Education: MA   Occupational History  . [employer]    Social History Main Topics  . Smoking status: Never Smoker   . Smokeless tobacco: Never Used  . Alcohol Use: 0.0 oz/week    0 Standard drinks or equivalent per week     Comment: occasional  . Drug Use: No     Comment: marijuana  . Sexual Activity: Not Currently   Other Topics Concern  . None   Social History Narrative   Patient lives  at home with her wife [name])    Patient works full time.   Arboriculturist.   Right handed.   Caffeine two cups daily.     Family History:  The patient's family history includes Breast cancer in her mother; Diabetes in her mother; Hypertension in her mother.   ROS:   Please see the history of present illness.    ROS All other systems reviewed and are negative.   PHYSICAL EXAM:   VS:  BP 112/60 mmHg  Pulse 59  Ht 5\' 10"  (1.778 m)  Wt 120.249 kg (265 lb 1.6 oz)  BMI 38.04 kg/m2   GEN: Well nourished, well developed, in no acute distress HEENT: normal Neck: no JVD, carotid bruits, or masses Cardiac: RRR; grade 1/6 early peaking systolic ejection murmur in the aortic  focus, no diastolic murmurs, rubs, or gallops,no edema  Respiratory:  clear to auscultation bilaterally, normal work of breathing GI: soft, nontender, nondistended, + BS MS: no deformity or atrophy Skin: warm and dry, no rash Neuro:  Alert and Oriented x 3, Strength and sensation are intact Psych: euthymic mood, full affect  Wt Readings from Last 3 Encounters:  10/15/15 120.249 kg (265 lb 1.6 oz)  09/10/15 119.523 kg (263 lb 8 oz)  03/08/15 119.115 kg (262 lb 9.6 oz)      Studies/Labs Reviewed:   EKG:  EKG is ordered today.  The ekg ordered today demonstrates minimal sinus bradycardia, poor R-wave progression, no repolarization abnormalities.  Recent Labs: 11/22/2014: Hemoglobin 12.8 09/10/2015: ALT 13; BUN 20; Creatinine, Ser 0.67; Platelets 225; Potassium 4.3; Sodium 139   Lipid Panel No results found for: CHOL, TRIG, HDL, CHOLHDL, VLDL, LDLCALC, LDLDIRECT   ASSESSMENT:    1. Essential hypertension   2. Pericardial effusion      PLAN:  In order of problems listed above:  1. Well-controlled blood pressure. Weight loss recommended. 2. No clinical evidence of recurrent pericardial effusion or sequelae following her pericardial window in 2015    Medication Adjustments/Labs and Tests Ordered: Current medicines are reviewed at length with the patient today.  Concerns regarding medicines are outlined above.  Medication changes, Labs and Tests ordered today are listed in the Patient Instructions below. Patient Instructions  Dr. Sallyanne Kuster recommends that you schedule a follow-up appointment in: [place]  10/15/2015 4:30 PM    [address], [place],   [address] Phone: [phone]; Fax: [phone]

## 2015-11-14 ENCOUNTER — Other Ambulatory Visit: Payer: Self-pay

## 2015-11-14 DIAGNOSIS — Z1231 Encounter for screening mammogram for malignant neoplasm of breast: Secondary | ICD-10-CM

## 2015-12-06 ENCOUNTER — Ambulatory Visit
Admission: RE | Admit: 2015-12-06 | Discharge: 2015-12-06 | Disposition: A | Discharge status: Home / Self Care | Payer: 59 | Source: Ambulatory Visit

## 2015-12-06 DIAGNOSIS — Z1231 Encounter for screening mammogram for malignant neoplasm of breast: Secondary | ICD-10-CM

## 2015-12-17 ENCOUNTER — Ambulatory Visit: Payer: 59 | Admitting: Adult Health

## 2015-12-24 ENCOUNTER — Other Ambulatory Visit: Payer: Self-pay | Admitting: Neurology

## 2016-01-01 ENCOUNTER — Ambulatory Visit (INDEPENDENT_AMBULATORY_CARE_PROVIDER_SITE_OTHER): Payer: 59 | Admitting: Adult Health

## 2016-01-01 ENCOUNTER — Encounter: Payer: Self-pay | Admitting: Adult Health

## 2016-01-01 VITALS — BP 126/80 | HR 68 | Ht 70.0 in | Wt 260.6 lb

## 2016-01-01 DIAGNOSIS — R208 Other disturbances of skin sensation: Secondary | ICD-10-CM

## 2016-01-01 DIAGNOSIS — G35 Multiple sclerosis: Secondary | ICD-10-CM | POA: Diagnosis not present

## 2016-01-01 DIAGNOSIS — R200 Anesthesia of skin: Secondary | ICD-10-CM

## 2016-01-01 NOTE — Progress Notes (Signed)
PATIENT: [name] DOB: September 09, 1960  REASON FOR VISIT: follow up HISTORY FROM: patient  HISTORY OF PRESENT ILLNESS: [name] is a 55 year old female with a history of multiple sclerosis. She returns today for follow-up. She continues on Tysabri and tolerates it well. She denies any new symptoms. She states that she has some "nerve weirdness" in the left arm. She states that she has neck tightness with pain that radiates to the shoulder. She does not have any pain that radiates down the arm. She states that she wakes up with her left hand numb. She has been using wrist splints with some benefit. She  Also has had massages of the neck with minimal benefit. The patient denies any changes with her walking or balance. She feels that her vision has remained stable. Denies any changes with the bowels or bladder. She continues to have intermittent urinary urgency. Overall she feels that she is doing well. She returns today for an evaluation.  HISTORY 09/10/15 (MM): [name] is a 55 year old female with a history of multiple sclerosis. She returns today for follow-up. The patient continues to take Tysabri and tolerates it well. At the last visit the patient was complaining of numbness in both hands. She began using wrist splints which she feels has been beneficial. She also went to neuromuscular therapy and feels that the exercises have helped as well. She also thinks that if she gets regular massages this will help the tension in her neck. The patient denies any new numbness or weakness. Denies any changes with her gait or balance. Denies any changes with the bowels or bladder. She continues to have intermittent urinary urgency. The patient denies any significant changes with the vision. She states in the last week her vision may be a little more blurry but this has been consistent with her diagnosis of uveitis. Overall she feels that she is doing well. She returns today for an evaluation.    HISTORY  03/08/15: [name] is a 55 year old right-handed white female with a history of multiple sclerosis. The patient has been on Tysabri, she is tolerating the medication well. She has developed some intermittent numbness in both hands over the last 3 or 4 months. She will wake up with the numbness in the hands, she may note numbness while driving. She will shake out the hands, and the numbness will improve. She also has some neck and shoulder discomfort that is chronic in nature, this was present last year. She engaged in physical therapy which was helpful, but she was unable to complete the therapy secondary to her pericarditis. The patient denies any other new symptoms such is weakness or gait instability or difficulty controlling the bowels or the bladder. She does report some urgency of the bladder. No new visual complaints have been noted. She just completed MRI evaluation of the brain and cervical spine on 02/28/2015. 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 She has mild spondylosis. The patient reports crepitus in the neck when turning the head. She returns to this office for an evaluation.  REVIEW OF SYSTEMS: Out of a complete 14 system review of symptoms, the patient complains only of the following symptoms, and all other reviewed systems are negative.  See history of present illness  ALLERGIES: Allergies  Allergen Reactions  . Penicillins Rash    HOME MEDICATIONS: Outpatient Prescriptions Prior to Visit  Medication Sig Dispense Refill  . Armodafinil 150 MG tablet  TAKE 1 TABLET BY MOUTH EVERY DAY 30 tablet 5  . doxepin (SINEQUAN) 50 MG capsule TAKE 1 CAPSULE BY MOUTH AT  BEDTIME 90 capsule 0  . escitalopram (LEXAPRO) 10 MG tablet Take 10 mg by mouth daily.    Marland Kitchen ibuprofen (ADVIL,MOTRIN) 200 MG tablet Take 400 mg by mouth every 6 (six) hours as needed.    Marland Kitchen losartan (COZAAR) 50 MG tablet Take 1 tablet by mouth daily.    . natalizumab 300  mg in sodium chloride 0.9 % 100 mL Inject 300 mg into the vein every 28 (twenty-eight) days.     No facility-administered medications prior to visit.    PAST MEDICAL HISTORY: Past Medical History  Diagnosis Date  . Asthma   . Hypertension   . Depression   . Back pain   . Muscle pain   . Obesity   . Uveitis     Bilateral  . MS (multiple sclerosis) ([place])   . Cardiac/pericardial tamponade 06/11/2014    s/p pericardial window 06/16/2014 per Dr. Darcey Nora  . Pleural effusion, bilateral 06/30/2014    PAST SURGICAL HISTORY: Past Surgical History  Procedure Laterality Date  . Uterine ablasion  2012  . Subxyphoid pericardial window N/A 06/16/2014    Procedure: SUBXYPHOID PERICARDIAL WINDOW;  Surgeon: Ivin Poot, MD;  Location: [facility] OR;  Service: Thoracic;  Laterality: N/A;  . Tee without cardioversion N/A 06/16/2014    Procedure: TRANSESOPHAGEAL ECHOCARDIOGRAM (TEE);  Surgeon: Ivin Poot, MD;  [facility] OR;  Service: Thoracic;  Laterality: N/A;  . Pericardial tap N/A 06/11/2014    Procedure: PERICARDIAL TAP;  Surgeon: Blane Ohara, MD;  Location: [facility] CATH LAB;  Service: Cardiovascular;  Laterality: N/A;    FAMILY HISTORY: Family History  Problem Relation Age of Onset  . Breast cancer Mother   . Hypertension Mother   . Diabetes Mother     SOCIAL HISTORY: Social History   Social History  . Marital Status: Single    Spouse Name: N/A  . Number of Children: 0  . Years of Education: MA   Occupational History  . [employer]    Social History Main Topics  . Smoking status: Never Smoker   . Smokeless tobacco: Never Used  . Alcohol Use: 0.0 oz/week    0 Standard drinks or equivalent per week     Comment: occasional  . Drug Use: No     Comment: marijuana  . Sexual Activity: Not Currently   Other Topics Concern  . Not on file   Social History Narrative   Patient lives at home with her wife [name])    Patient works full time.   Merchandiser, retail.   Right handed.   Caffeine two cups daily.      PHYSICAL EXAM  Filed Vitals:   01/01/16 1056  BP: 126/80  Pulse: 68  Height: 5\' 10"  (1.778 m)  Weight: 260 lb 9.6 oz (118.207 kg)   Body mass index is 37.39 kg/(m^2).  Generalized: Well developed, in no acute distress   Neurological examination  Mentation: Alert oriented to time, place, history taking. Follows all commands speech and language fluent Cranial nerve II-XII: Pupils were equal round reactive to light. Extraocular movements were full, visual field were full on confrontational test. Facial sensation and strength were normal. Uvula tongue midline. Head turning and shoulder shrug  were normal and symmetric. Motor: The motor testing reveals 5 over 5 strength of all 4 extremities. Good symmetric motor tone is  noted throughout.  Sensory: Sensory testing is intact to soft touch on all 4 extremities. No evidence of extinction is noted.  Coordination: Cerebellar testing reveals good finger-nose-finger and heel-to-shin bilaterally.  Gait and station: Gait is normal. Tandem gait is normal. Romberg is negative. No drift is seen.  Reflexes: Deep tendon reflexes are symmetric and normal bilaterally.   DIAGNOSTIC DATA (LABS, IMAGING, TESTING) - I reviewed patient records, labs, notes, testing and imaging myself where available.  Lab Results  Component Value Date   WBC 8.3 09/10/2015   HGB 12.8 11/22/2014   HCT 38.5 09/10/2015   MCV 87 09/10/2015   PLT 225 09/10/2015      Component Value Date/Time   NA 139 09/10/2015 1034   NA 136 07/10/2014 0944   K 4.3 09/10/2015 1034   CL 99 09/10/2015 1034   CO2 24 09/10/2015 1034   GLUCOSE 85 09/10/2015 1034   GLUCOSE 84 07/10/2014 0944   BUN 20 09/10/2015 1034   BUN 14 07/10/2014 0944   CREATININE 0.67 09/10/2015 1034   CREATININE 0.76 06/27/2014 0915   CALCIUM 9.4 09/10/2015 1034   PROT 6.9 09/10/2015 1034   PROT 7.0 06/18/2014 0300   ALBUMIN 4.1 09/10/2015 1034    ALBUMIN 2.6* 06/18/2014 0300   AST 16 09/10/2015 1034   ALT 13 09/10/2015 1034   ALKPHOS 113 09/10/2015 1034   BILITOT 0.2 09/10/2015 1034   BILITOT <0.2* 06/18/2014 0300   GFRNONAA 101 09/10/2015 1034   GFRAA 117 09/10/2015 1034      ASSESSMENT AND PLAN 55 y.o. year old female  has a past medical history of Asthma; Hypertension; Depression; Back pain; Muscle pain; Obesity; Uveitis; MS (multiple sclerosis) ([place]); Cardiac/pericardial tamponade (06/11/2014); and Pleural effusion, bilateral (06/30/2014). here with:  1. Multiple sclerosis 2. Left hand numbness-intermittent  The patient will remain on Tysabri. I will check blood work today. The patient's last MRI was in July 2016. I will repeat an MRI of the brain and cervical spine to look for any acute changes. The patient also is having ongoing left hand numbness typically upon awakening. We will wait for the results of the MRI of the cervical spine and pending those results we may consider a nerve conduction study with EMG. Patient is amenable to this plan. She is advised that if her symptoms worsen or she develops any new symptoms she should let us know. She will follow-up in 6 months with Dr. Jannifer Franklin.     Ward Givens, MSN, NP-C 01/01/2016, 11:07 AM [facility] [address] [place], [place] [address] [phone]

## 2016-01-01 NOTE — Patient Instructions (Signed)
Continue Tysabri Blood work today Repeat MRI brain and cervical spine If your symptoms worsen or you develop new symptoms please let us know.

## 2016-01-01 NOTE — Progress Notes (Signed)
I have read the note, and I agree with the clinical assessment and plan.  [name],[name]   

## 2016-01-02 ENCOUNTER — Telehealth: Payer: Self-pay | Admitting: *Deleted

## 2016-01-02 LAB — COMPREHENSIVE METABOLIC PANEL
ALT: 17 IU/L (ref 0–32)
AST: 17 IU/L (ref 0–40)
Albumin/Globulin Ratio: 1.5 (ref 1.2–2.2)
Albumin: 4.3 g/dL (ref 3.5–5.5)
Alkaline Phosphatase: 108 IU/L (ref 39–117)
BUN/Creatinine Ratio: 22 (ref 9–23)
BUN: 16 mg/dL (ref 6–24)
Bilirubin Total: 0.3 mg/dL (ref 0.0–1.2)
CALCIUM: 9.4 mg/dL (ref 8.7–10.2)
CO2: 24 mmol/L (ref 18–29)
CREATININE: 0.73 mg/dL (ref 0.57–1.00)
Chloride: 101 mmol/L (ref 96–106)
GFR calc Af Amer: 110 mL/min/{1.73_m2} (ref >59)
GFR, EST NON AFRICAN AMERICAN: 95 mL/min/{1.73_m2} (ref >59)
GLOBULIN, TOTAL: 2.9 g/dL (ref 1.5–4.5)
GLUCOSE: 80 mg/dL (ref 65–99)
Potassium: 4.2 mmol/L (ref 3.5–5.2)
SODIUM: 142 mmol/L (ref 134–144)
Total Protein: 7.2 g/dL (ref 6.0–8.5)

## 2016-01-02 LAB — CBC WITH DIFFERENTIAL/PLATELET
BASOS: 1 %
Basophils Absolute: 0 10*3/uL (ref 0.0–0.2)
EOS (ABSOLUTE): 0.3 10*3/uL (ref 0.0–0.4)
Eos: 4 %
HEMATOCRIT: 40.2 % (ref 34.0–46.6)
HEMOGLOBIN: 13.5 g/dL (ref 11.1–15.9)
IMMATURE GRANS (ABS): 0 10*3/uL (ref 0.0–0.1)
IMMATURE GRANULOCYTES: 0 %
LYMPHS: 49 %
Lymphocytes Absolute: 3.9 10*3/uL — ABNORMAL HIGH (ref 0.7–3.1)
MCH: 29.9 pg (ref 26.6–33.0)
MCHC: 33.6 g/dL (ref 31.5–35.7)
MCV: 89 fL (ref 79–97)
MONOCYTES: 7 %
Monocytes Absolute: 0.5 10*3/uL (ref 0.1–0.9)
NEUTROS PCT: 39 %
Neutrophils Absolute: 3.1 10*3/uL (ref 1.4–7.0)
Platelets: 218 10*3/uL (ref 150–379)
RBC: 4.52 x10E6/uL (ref 3.77–5.28)
RDW: 14.4 % (ref 12.3–15.4)
WBC: 7.9 10*3/uL (ref 3.4–10.8)

## 2016-01-02 NOTE — Telephone Encounter (Signed)
Spoke to pt and relayed that lab results ok.  JCV pending, will call when resulted.  She verbalized understanding.

## 2016-01-02 NOTE — Telephone Encounter (Signed)
-----   Message from Ward Givens, NP sent at 01/02/2016  7:36 AM EDT ----- Lab work ok. Waiting on JCV. Please call patient.

## 2016-01-09 ENCOUNTER — Telehealth: Payer: Self-pay | Admitting: Adult Health

## 2016-01-09 NOTE — Telephone Encounter (Signed)
I spoke to patient and she is aware.

## 2016-01-09 NOTE — Telephone Encounter (Signed)
JCV antibody negative 0.11. Please call patient with result.

## 2016-01-17 ENCOUNTER — Telehealth: Payer: Self-pay

## 2016-01-17 NOTE — Telephone Encounter (Signed)
Received fax from [employer] Prescribing Program. Enrollment period in the program has been authorized 01/12/16 through 07/12/16. Enrollment # [id number].  [employer] T # [id number]. Submit pre-infusion pt checklist w/i 1 business day of Tysabri visit to F # [phone].

## 2016-02-20 ENCOUNTER — Ambulatory Visit (INDEPENDENT_AMBULATORY_CARE_PROVIDER_SITE_OTHER): Payer: 59

## 2016-02-20 DIAGNOSIS — G35 Multiple sclerosis: Secondary | ICD-10-CM

## 2016-02-21 MED ORDER — GADOPENTETATE DIMEGLUMINE 469.01 MG/ML IV SOLN: 20.0000 mL | Freq: Once | INTRAVENOUS | Status: DC | PRN

## 2016-02-25 ENCOUNTER — Telehealth: Payer: Self-pay | Admitting: Adult Health

## 2016-02-25 NOTE — Telephone Encounter (Signed)
I called the patient. MRI brain is stable. MRI cervical spine shows possible nerve root compression? I advised the patient that I would like Dr. Jannifer Franklin to review her scan before we determine further treatment. Patient voiced understanding. Dr. Jannifer Franklin will return to the office on Wednesday.

## 2016-02-28 NOTE — Telephone Encounter (Signed)
I called the patient. Left message for her to call office.   Dr. Jannifer Franklin recommended follow-up with NCS/EMG given results of cervical spine. I will consult with patient to see if she would like to proceed.

## 2016-04-03 ENCOUNTER — Telehealth: Payer: Self-pay | Admitting: Neurology

## 2016-04-03 MED ORDER — DOXEPIN HCL 50 MG PO CAPS: 50.0000 mg | ORAL_CAPSULE | Freq: Every day | ORAL | 3 refills | Status: DC

## 2016-04-03 NOTE — Telephone Encounter (Signed)
Rx refilled as requested.  

## 2016-04-03 NOTE — Telephone Encounter (Signed)
Patient requesting refill of doxepin (SINEQUAN) 50 MG capsule Pharmacy: [facility]

## 2016-04-03 NOTE — Addendum Note (Signed)
Addended by: Monte Fantasia on: 04/03/2016 04:00 PM   Modules accepted: Orders

## 2016-05-26 ENCOUNTER — Other Ambulatory Visit: Payer: Self-pay | Admitting: Neurology

## 2016-05-26 MED ORDER — ARMODAFINIL 150 MG PO TABS: 150.0000 mg | ORAL_TABLET | Freq: Every day | ORAL | 5 refills | Status: DC

## 2016-06-11 ENCOUNTER — Other Ambulatory Visit (HOSPITAL_COMMUNITY)
Admission: RE | Admit: 2016-06-11 | Discharge: 2016-06-11 | Disposition: A | Discharge status: Home / Self Care | Payer: 59 | Source: Ambulatory Visit | Attending: Internal Medicine | Admitting: Internal Medicine

## 2016-06-11 ENCOUNTER — Other Ambulatory Visit: Payer: Self-pay | Admitting: Internal Medicine

## 2016-06-11 DIAGNOSIS — Z01419 Encounter for gynecological examination (general) (routine) without abnormal findings: Secondary | ICD-10-CM | POA: Insufficient documentation

## 2016-06-11 DIAGNOSIS — Z1151 Encounter for screening for human papillomavirus (HPV): Secondary | ICD-10-CM | POA: Insufficient documentation

## 2016-06-16 LAB — CYTOLOGY - PAP
DIAGNOSIS: NEGATIVE
HPV: NOT DETECTED

## 2016-07-14 ENCOUNTER — Telehealth: Payer: Self-pay | Admitting: Neurology

## 2016-07-14 NOTE — Telephone Encounter (Signed)
Debbie with [employer] is calling because she has not received the 6 month re-authorization  for Tysabri which expired on 07-12-16 for the patient.

## 2016-07-15 ENCOUNTER — Encounter: Payer: Self-pay | Admitting: Neurology

## 2016-07-15 ENCOUNTER — Ambulatory Visit (INDEPENDENT_AMBULATORY_CARE_PROVIDER_SITE_OTHER): Payer: 59 | Admitting: Neurology

## 2016-07-15 VITALS — BP 131/84 | HR 61 | Ht 70.0 in | Wt 256.0 lb

## 2016-07-15 DIAGNOSIS — G35 Multiple sclerosis: Secondary | ICD-10-CM | POA: Diagnosis not present

## 2016-07-15 DIAGNOSIS — G4761 Periodic limb movement disorder: Secondary | ICD-10-CM

## 2016-07-15 DIAGNOSIS — Z5181 Encounter for therapeutic drug level monitoring: Secondary | ICD-10-CM | POA: Diagnosis not present

## 2016-07-15 HISTORY — DX: Periodic limb movement disorder: G47.61

## 2016-07-15 MED ORDER — GABAPENTIN 300 MG PO CAPS: 300.0000 mg | ORAL_CAPSULE | Freq: Every day | ORAL | 3 refills | Status: DC

## 2016-07-15 NOTE — Patient Instructions (Signed)
   Neurontin (gabapentin) may result in drowsiness, ankle swelling, gait instability, or possibly dizziness. Please contact our office if significant side effects occur with this medication.  

## 2016-07-15 NOTE — Telephone Encounter (Signed)
Returned call to [employer]. Did not receive re-authorization form for pt. Agreed to re-fax to F [phone].

## 2016-07-15 NOTE — Progress Notes (Signed)
Reason for visit: Multiple sclerosis  [name] is an 55 y.o. female  History of present illness:  [name] is a 55 year old right-handed white female with a history of multiple sclerosis currently being treated with Tysabri. She is tolerating the Tysabri well, she does not report any new issues associated with this such as visual changes, new numbness or weakness of the face, arms, or legs, difficulty with balance, or changes in bowel or bladder control. The patient has had some ongoing neck discomfort, MRI of the cervical spine has shown several spinal cord lesions, but she also has evidence of nerve root impingement bilaterally at the C5 and C6 levels, and to the left with the C7 nerve root. The patient has discomfort primarily down the left arm, but she may have some numbness of the right hand as well. She is to go for a sleep study in the near future, she has had periodic limb movements with jerking of the extremities at night. She is not resting well, she does have some daytime fatigue. The patient has difficulty getting to sleep frequently because of discomfort in the neck that is worse in the evening hours. The patient returns to this office for an evaluation.  Past Medical History:  Diagnosis Date  . Asthma   . Back pain   . Cardiac/pericardial tamponade 06/11/2014   s/p pericardial window 06/16/2014 per Dr. Darcey Nora  . Depression   . Hypertension   . MS (multiple sclerosis) ([place])   . Muscle pain   . Obesity   . Pleural effusion, bilateral 06/30/2014  . Uveitis    Bilateral    Past Surgical History:  Procedure Laterality Date  . PERICARDIAL TAP N/A 06/11/2014   Procedure: PERICARDIAL TAP;  Surgeon: Blane Ohara, MD;  Location: [facility] CATH LAB;  Service: Cardiovascular;  Laterality: N/A;  . SUBXYPHOID PERICARDIAL WINDOW N/A 06/16/2014   Procedure: SUBXYPHOID PERICARDIAL WINDOW;  Surgeon: Ivin Poot, MD;  Location: [place];  Service: Thoracic;  Laterality: N/A;  . TEE  WITHOUT CARDIOVERSION N/A 06/16/2014   Procedure: TRANSESOPHAGEAL ECHOCARDIOGRAM (TEE);  Surgeon: Ivin Poot, MD;  Location: [facility] OR;  Service: Thoracic;  Laterality: N/A;  . uterine ablasion  2012    Family History  Problem Relation Age of Onset  . Breast cancer Mother   . Hypertension Mother   . Diabetes Mother   . Heart attack Father   . Epilepsy Father   . Endocrine tumor Brother     Social history:  reports that she has never smoked. She has never used smokeless tobacco. She reports that she drinks alcohol. She reports that she does not use drugs.    Allergies  Allergen Reactions  . Penicillins Rash    Medications:  Prior to Admission medications   Medication Sig Start Date End Date Taking? Authorizing Provider  Armodafinil 150 MG tablet Take 1 tablet (150 mg total) by mouth daily. 05/26/16  Yes Kathrynn Ducking, MD  doxepin (SINEQUAN) 50 MG capsule Take 1 capsule (50 mg total) by mouth at bedtime. 04/03/16  Yes Kathrynn Ducking, MD  escitalopram (LEXAPRO) 10 MG tablet Take 10 mg by mouth daily.   Yes Historical Provider, MD  ibuprofen (ADVIL,MOTRIN) 200 MG tablet Take 400 mg by mouth every 6 (six) hours as needed.   Yes Historical Provider, MD  losartan (COZAAR) 50 MG tablet Take 1 tablet by mouth daily. 09/08/15  Yes Historical Provider, MD  natalizumab 300 mg in sodium chloride 0.9 %  100 mL Inject 300 mg into the vein every 28 (twenty-eight) days.   Yes Historical Provider, MD  VENTOLIN HFA 108 [phone] Base) MCG/ACT inhaler  12/17/15  Yes Historical Provider, MD    ROS:  Out of a complete 14 system review of symptoms, the patient complains only of the following symptoms, and all other reviewed systems are negative.  Runny nose Blurred vision Cough Constipation Restless legs, insomnia Environmental allergies Frequency of urination Neck pain, neck stiffness  Blood pressure 131/84, pulse 61, height 5\' 10"  (1.778 m), weight 256 lb (116.1 kg).  Physical Exam  General:  The patient is alert and cooperative at the time of the examination. The patient is moderately obese.  Skin: No significant peripheral edema is noted.   Neurologic Exam  Mental status: The patient is alert and oriented x 3 at the time of the examination. The patient has apparent normal recent and remote memory, with an apparently normal attention span and concentration ability.   Cranial nerves: Facial symmetry is present. Speech is normal, no aphasia or dysarthria is noted. Extraocular movements are full. Visual fields are full. Pupils are equal, round, and reactive to light. Discs are flat bilaterally.  Motor: The patient has good strength in all 4 extremities.  Sensory examination: Soft touch sensation is symmetric on the face, but is slightly decreased on the left arm and left leg.  Coordination: The patient has good finger-nose-finger and heel-to-shin bilaterally. Tinel's sign at the wrists is minimally positive on the right, negative on the left.  Gait and station: The patient has a normal gait. Tandem gait is slightly unsteady. Romberg is negative. No drift is seen.  Reflexes: Deep tendon reflexes are symmetric, with exception of some slight increase in the left biceps reflex is compared to the right.   Assessment/Plan:  1. Multiple sclerosis  2. Cervical spondylosis, left greater than right upper extremity discomfort   3. Periodic limb movements  The patient will be placed on gabapentin for the neck and shoulder discomfort, and for the periodic limb movements at night. The patient is on Lexapro which may worsen this. The patient will have a sleep study in the near future. She will continue the Tysabri, blood work will be done today.  Julie Alexanders MD 07/15/2016 9:36 AM  [facility] [address] [place], [place] [address]  Phone [phone] Fax [phone]

## 2016-07-16 LAB — CBC WITH DIFFERENTIAL/PLATELET
BASOS ABS: 0 10*3/uL (ref 0.0–0.2)
Basos: 1 %
EOS (ABSOLUTE): 0.3 10*3/uL (ref 0.0–0.4)
Eos: 4 %
HEMOGLOBIN: 13.4 g/dL (ref 11.1–15.9)
Hematocrit: 38.7 % (ref 34.0–46.6)
Immature Grans (Abs): 0 10*3/uL (ref 0.0–0.1)
Immature Granulocytes: 0 %
LYMPHS ABS: 3.4 10*3/uL — AB (ref 0.7–3.1)
LYMPHS: 46 %
MCH: 30.4 pg (ref 26.6–33.0)
MCHC: 34.6 g/dL (ref 31.5–35.7)
MCV: 88 fL (ref 79–97)
MONOCYTES: 8 %
Monocytes Absolute: 0.6 10*3/uL (ref 0.1–0.9)
Neutrophils Absolute: 3 10*3/uL (ref 1.4–7.0)
Neutrophils: 41 %
PLATELETS: 212 10*3/uL (ref 150–379)
RBC: 4.41 x10E6/uL (ref 3.77–5.28)
RDW: 14.7 % (ref 12.3–15.4)
WBC: 7.5 10*3/uL (ref 3.4–10.8)

## 2016-07-18 NOTE — Telephone Encounter (Signed)
[place] [employer] re-auth form completed, signed and faxed to [employer] F # [phone]. 

## 2016-07-22 ENCOUNTER — Telehealth: Payer: Self-pay | Admitting: Neurology

## 2016-07-22 NOTE — Telephone Encounter (Signed)
JC virus antibody test was negative.

## 2016-08-29 DIAGNOSIS — G35 Multiple sclerosis: Secondary | ICD-10-CM | POA: Diagnosis not present

## 2016-09-02 DIAGNOSIS — G4721 Circadian rhythm sleep disorder, delayed sleep phase type: Secondary | ICD-10-CM | POA: Diagnosis not present

## 2016-09-02 DIAGNOSIS — G4761 Periodic limb movement disorder: Secondary | ICD-10-CM | POA: Diagnosis not present

## 2016-09-25 ENCOUNTER — Encounter: Payer: Self-pay | Admitting: *Deleted

## 2016-09-25 NOTE — Progress Notes (Signed)
[id number];Review Type:Prior Auth;Coverage Start Date:08/26/2016;Coverage End Date:09/25/2017.

## 2016-09-25 NOTE — Progress Notes (Signed)
Initiated PA armodafinil on covermymeds. Key [id number]. Awaiting response.

## 2016-09-26 DIAGNOSIS — G35 Multiple sclerosis: Secondary | ICD-10-CM | POA: Diagnosis not present

## 2016-10-01 DIAGNOSIS — G4733 Obstructive sleep apnea (adult) (pediatric): Secondary | ICD-10-CM | POA: Diagnosis not present

## 2016-10-01 DIAGNOSIS — G4761 Periodic limb movement disorder: Secondary | ICD-10-CM | POA: Diagnosis not present

## 2016-10-20 DIAGNOSIS — G4733 Obstructive sleep apnea (adult) (pediatric): Secondary | ICD-10-CM | POA: Diagnosis not present

## 2016-10-20 DIAGNOSIS — G4721 Circadian rhythm sleep disorder, delayed sleep phase type: Secondary | ICD-10-CM | POA: Diagnosis not present

## 2016-10-24 DIAGNOSIS — R6884 Jaw pain: Secondary | ICD-10-CM | POA: Diagnosis not present

## 2016-10-24 DIAGNOSIS — R110 Nausea: Secondary | ICD-10-CM | POA: Diagnosis not present

## 2016-10-24 DIAGNOSIS — H9201 Otalgia, right ear: Secondary | ICD-10-CM | POA: Diagnosis not present

## 2016-10-24 DIAGNOSIS — G35 Multiple sclerosis: Secondary | ICD-10-CM | POA: Diagnosis not present

## 2016-10-27 ENCOUNTER — Telehealth: Payer: Self-pay | Admitting: Cardiovascular Disease

## 2016-10-27 NOTE — Telephone Encounter (Signed)
New message    Pt is calling stating she went to urgent care because she was having pain in her jaw, tiredness and mild nausea. They were concerned it was her heart. Pt has appt scheduled for 3/29 at 830am.

## 2016-10-27 NOTE — Telephone Encounter (Signed)
Left detailed massage-go to ER if having CP and Jaw pain-no appts avail here this week

## 2016-10-28 NOTE — Telephone Encounter (Signed)
Lm2cb-phone does not ring goes directly to vm

## 2016-10-29 NOTE — Telephone Encounter (Signed)
Unable to contact pt will wait for pt to call back

## 2016-10-31 DIAGNOSIS — G4733 Obstructive sleep apnea (adult) (pediatric): Secondary | ICD-10-CM | POA: Diagnosis not present

## 2016-11-06 ENCOUNTER — Ambulatory Visit: Payer: 59 | Admitting: Cardiology

## 2016-11-21 ENCOUNTER — Ambulatory Visit: Payer: 59 | Admitting: Cardiology

## 2016-11-21 DIAGNOSIS — G35 Multiple sclerosis: Secondary | ICD-10-CM | POA: Diagnosis not present

## 2016-12-01 DIAGNOSIS — G4733 Obstructive sleep apnea (adult) (pediatric): Secondary | ICD-10-CM | POA: Diagnosis not present

## 2016-12-09 DIAGNOSIS — G4721 Circadian rhythm sleep disorder, delayed sleep phase type: Secondary | ICD-10-CM | POA: Diagnosis not present

## 2016-12-09 DIAGNOSIS — G4733 Obstructive sleep apnea (adult) (pediatric): Secondary | ICD-10-CM | POA: Diagnosis not present

## 2016-12-09 DIAGNOSIS — I10 Essential (primary) hypertension: Secondary | ICD-10-CM | POA: Diagnosis not present

## 2016-12-09 DIAGNOSIS — J4520 Mild intermittent asthma, uncomplicated: Secondary | ICD-10-CM | POA: Diagnosis not present

## 2016-12-13 ENCOUNTER — Other Ambulatory Visit: Payer: Self-pay | Admitting: Neurology

## 2016-12-18 DIAGNOSIS — G35 Multiple sclerosis: Secondary | ICD-10-CM | POA: Diagnosis not present

## 2016-12-22 ENCOUNTER — Other Ambulatory Visit: Payer: Self-pay | Admitting: Neurology

## 2016-12-22 ENCOUNTER — Telehealth: Payer: Self-pay | Admitting: Neurology

## 2016-12-22 MED ORDER — GABAPENTIN 300 MG PO CAPS: 300.0000 mg | ORAL_CAPSULE | Freq: Every day | ORAL | 3 refills | Status: DC

## 2016-12-22 NOTE — Telephone Encounter (Signed)
Angelica called from [facility] needing authorization for 90 day supply not 30 day supply for  gabapentin (NEURONTIN) 300 MG capsule.  Reference number [id number].  Please call

## 2016-12-22 NOTE — Addendum Note (Signed)
Addended by: Kathrynn Ducking on: 12/22/2016 05:06 PM   Modules accepted: Orders

## 2016-12-22 NOTE — Telephone Encounter (Signed)
Gabapentin was rewritten for a 90 day supply.

## 2016-12-24 DIAGNOSIS — J4520 Mild intermittent asthma, uncomplicated: Secondary | ICD-10-CM | POA: Diagnosis not present

## 2016-12-25 ENCOUNTER — Other Ambulatory Visit: Payer: Self-pay | Admitting: *Deleted

## 2016-12-25 ENCOUNTER — Telehealth: Payer: Self-pay | Admitting: Neurology

## 2016-12-25 MED ORDER — ARMODAFINIL 150 MG PO TABS: 150.0000 mg | ORAL_TABLET | Freq: Every day | ORAL | 0 refills | Status: DC

## 2016-12-25 NOTE — Telephone Encounter (Signed)
[facility] said [facility] [facility] not rec[facility]ill for Armodafinil 150 MG tablet . Pt is leaving at noon for [place]. She apologizes for the late notice

## 2016-12-25 NOTE — Telephone Encounter (Signed)
Prescription was sent 12/22/16, not received by pharmacy.  Reprinted Rx, on desk for Dr Jannifer Franklin' sig.  [facility] [place] [facility]. Received confirmation of successful fax.

## 2016-12-31 DIAGNOSIS — G4733 Obstructive sleep apnea (adult) (pediatric): Secondary | ICD-10-CM | POA: Diagnosis not present

## 2017-01-13 ENCOUNTER — Encounter: Payer: Self-pay | Admitting: Adult Health

## 2017-01-13 ENCOUNTER — Ambulatory Visit (INDEPENDENT_AMBULATORY_CARE_PROVIDER_SITE_OTHER): Payer: 59 | Admitting: Adult Health

## 2017-01-13 VITALS — BP 149/87 | HR 71 | Ht 70.0 in | Wt 266.8 lb

## 2017-01-13 DIAGNOSIS — M542 Cervicalgia: Secondary | ICD-10-CM | POA: Diagnosis not present

## 2017-01-13 DIAGNOSIS — Z5181 Encounter for therapeutic drug level monitoring: Secondary | ICD-10-CM

## 2017-01-13 DIAGNOSIS — G35 Multiple sclerosis: Secondary | ICD-10-CM | POA: Diagnosis not present

## 2017-01-13 MED ORDER — DOXEPIN HCL 25 MG PO CAPS: 25.0000 mg | ORAL_CAPSULE | Freq: Every day | ORAL | 5 refills | Status: DC

## 2017-01-13 MED ORDER — GABAPENTIN 100 MG PO CAPS: 100.0000 mg | ORAL_CAPSULE | Freq: Every day | ORAL | 5 refills | Status: DC

## 2017-01-13 NOTE — Progress Notes (Signed)
I have read the note, and I agree with the clinical assessment and plan.  Emmanuel Gruenhagen KEITH   

## 2017-01-13 NOTE — Progress Notes (Signed)
PATIENT: [name] DOB: 07/22/1961  REASON FOR VISIT: follow up-multiple sclerosis HISTORY FROM: patient  HISTORY OF PRESENT ILLNESS: [name] is a 56 year old female with a history of multiple sclerosis. She is currently on Tysabri and tolerating it well. At the last visit the patient was placed on gabapentin for neck pain that radiated down the left arm. She states that she continues to have some neck pain as well as numbness and tingling down the arms and perhaps some weakness on the left arm. She states that she uses heat, massage, and exercise and that seems to help her neck pain as well. She does feel that gabapentin has offered her some benefit. Reports that she is interested in steroid injections if she is a candidate. She denies any changes with her gait or balance. No changes in bowels or bladder. No change in her vision. She reports that she did have a sleep study that did show that she had a lot of periodic leg movements at night. She reports that her sleep physician recommended weaning her off of doxepin. The patient states that she would like to try to get off of this medication and perhaps only uses gabapentin at bedtime. She is now on CPAP therapy as well. She reports that starting approximately 3 months ago she began noticing some changes with her memory. Reports that she does not feel that she can think as sharply as she used to. Unsure if this is related to any initiation of medication. She returns today for an evaluation.  HISTORY 07/15/16: [name] is a 56 year old right-handed white female with a history of multiple sclerosis currently being treated with Tysabri. She is tolerating the Tysabri well, she does not report any new issues associated with this such as visual changes, new numbness or weakness of the face, arms, or legs, difficulty with balance, or changes in bowel or bladder control. The patient has had some ongoing neck discomfort, MRI of the cervical spine has shown  several spinal cord lesions, but she also has evidence of nerve root impingement bilaterally at the C5 and C6 levels, and to the left with the C7 nerve root. The patient has discomfort primarily down the left arm, but she may have some numbness of the right hand as well. She is to go for a sleep study in the near future, she has had periodic limb movements with jerking of the extremities at night. She is not resting well, she does have some daytime fatigue. The patient has difficulty getting to sleep frequently because of discomfort in the neck that is worse in the evening hours. The patient returns to this office for an evaluation.  REVIEW OF SYSTEMS: Out of a complete 14 system review of symptoms, the patient complains only of the following symptoms, and all other reviewed systems are negative.  Light sensitivity, cough, restless leg, apnea, frequent waking, back pain, neck pain, neck stiffness, daytime sleepiness, snoring, depression, weakness, environmental allergies  ALLERGIES: Allergies  Allergen Reactions  . Penicillins Rash    HOME MEDICATIONS: Outpatient Medications Prior to Visit  Medication Sig Dispense Refill  . Armodafinil 150 MG tablet Take 1 tablet (150 mg total) by mouth daily. 30 tablet 0  . doxepin (SINEQUAN) 50 MG capsule Take 1 capsule (50 mg total) by mouth at bedtime. 90 capsule 3  . escitalopram (LEXAPRO) 10 MG tablet Take 10 mg by mouth daily.    Marland Kitchen gabapentin (NEURONTIN) 300 MG capsule Take 1 capsule (300 mg total) by  mouth at bedtime. 90 capsule 3  . ibuprofen (ADVIL,MOTRIN) 200 MG tablet Take 400 mg by mouth every 6 (six) hours as needed.    Marland Kitchen losartan (COZAAR) 50 MG tablet Take 1 tablet by mouth daily.    . natalizumab 300 mg in sodium chloride 0.9 % 100 mL Inject 300 mg into the vein every 28 (twenty-eight) days.    . VENTOLIN HFA 108 (90 Base) MCG/ACT inhaler      Facility-Administered Medications Prior to Visit  Medication Dose Route Frequency Provider Last Rate  Last Dose  . gadopentetate dimeglumine (MAGNEVIST) injection 20 mL  20 mL Intravenous Once PRN Ward Givens, NP        PAST MEDICAL HISTORY: Past Medical History:  Diagnosis Date  . Asthma   . Back pain   . Cardiac/pericardial tamponade 06/11/2014   s/p pericardial window 06/16/2014 per Dr. Darcey Nora  . Depression   . Hypertension   . MS (multiple sclerosis) ([place])   . Muscle pain   . Obesity   . Periodic limb movement disorder (PLMD) 07/15/2016  . Pleural effusion, bilateral 06/30/2014  . Uveitis    Bilateral    PAST SURGICAL HISTORY: Past Surgical History:  Procedure Laterality Date  . PERICARDIAL TAP N/A 06/11/2014   Procedure: PERICARDIAL TAP;  Surgeon: Blane Ohara, MD;  Location: [facility] CATH LAB;  Service: Cardiovascular;  Laterality: N/A;  . SUBXYPHOID PERICARDIAL WINDOW N/A 06/16/2014   Procedure: SUBXYPHOID PERICARDIAL WINDOW;  Surgeon: Ivin Poot, MD;  Location: [place];  Service: Thoracic;  Laterality: N/A;  . TEE WITHOUT CARDIOVERSION N/A 06/16/2014   Procedure: TRANSESOPHAGEAL ECHOCARDIOGRAM (TEE);  Surgeon: Ivin Poot, MD;  Location: [place];  Service: Thoracic;  Laterality: N/A;  . uterine ablasion  2012    FAMILY HISTORY: Family History  Problem Relation Age of Onset  . Breast cancer Mother   . Hypertension Mother   . Diabetes Mother   . Heart attack Father   . Epilepsy Father   . Endocrine tumor Brother     SOCIAL HISTORY: Social History   Social History  . Marital status: Single    Spouse name: N/A  . Number of children: 0  . Years of education: MA   Occupational History  . [employer]    Social History Main Topics  . Smoking status: Never Smoker  . Smokeless tobacco: Never Used  . Alcohol use 0.0 oz/week     Comment: occasional  . Drug use: No     Comment: marijuana  . Sexual activity: Not Currently   Other Topics Concern  . Not on file   Social History Narrative   Patient lives at home with her wife [name])      Patient works full time.   Arboriculturist.   Right handed.   Caffeine two cups daily.      PHYSICAL EXAM  Vitals:   01/13/17 0928  BP: (!) 149/87  Pulse: 71  Weight: 266 lb 12.8 oz (121 kg)  Height: 5\' 10"  (1.778 m)   Body mass index is 38.28 kg/m.  Generalized: Well developed, in no acute distress   Neurological examination  Mentation: Alert oriented to time, place, history taking. Follows all commands speech and language fluent Cranial nerve II-XII: Pupils were equal round reactive to light. Extraocular movements were full, visual field were full on confrontational test. Facial sensation and strength were normal. Uvula tongue midline. Head turning and shoulder shrug  were normal and symmetric. Motor: The motor  testing reveals 5 over 5 strength of all 4 extremities. Good symmetric motor tone is noted throughout.  Sensory: Sensory testing is intact to soft touch on all 4 extremities. No evidence of extinction is noted.  Coordination: Cerebellar testing reveals good finger-nose-finger and heel-to-shin bilaterally.  Gait and station: Gait is normal. Tandem gait is normal. Romberg is negative. No drift is seen.  Reflexes: Deep tendon reflexes are symmetric and normal bilaterally.   DIAGNOSTIC DATA (LABS, IMAGING, TESTING) - I reviewed patient records, labs, notes, testing and imaging myself where available.  Lab Results  Component Value Date   WBC 7.5 07/15/2016   HGB 12.8 11/22/2014   HCT 38.7 07/15/2016   MCV 88 07/15/2016   PLT 212 07/15/2016      Component Value Date/Time   NA 142 01/01/2016 1304   K 4.2 01/01/2016 1304   CL 101 01/01/2016 1304   CO2 24 01/01/2016 1304   GLUCOSE 80 01/01/2016 1304   GLUCOSE 84 07/10/2014 0944   BUN 16 01/01/2016 1304   CREATININE 0.73 01/01/2016 1304   CREATININE 0.76 06/27/2014 0915   CALCIUM 9.4 01/01/2016 1304   PROT 7.2 01/01/2016 1304   ALBUMIN 4.3 01/01/2016 1304   AST 17 01/01/2016 1304   ALT 17 01/01/2016 1304    ALKPHOS 108 01/01/2016 1304   BILITOT 0.3 01/01/2016 1304   GFRNONAA 95 01/01/2016 1304   GFRAA 110 01/01/2016 1304     ASSESSMENT AND PLAN 56 y.o. year old female  has a past medical history of Asthma; Back pain; Cardiac/pericardial tamponade (06/11/2014); Depression; Hypertension; MS (multiple sclerosis) ([place]); Muscle pain; Obesity; Periodic limb movement disorder (PLMD) (07/15/2016); Pleural effusion, bilateral (06/30/2014); and Uveitis. here with:  1. Multiple sclerosis 2. Neck pain  The patient will continue on Tysabri. I will check blood work. Her last MRI of the brain and cervical spine was approximately one year ago. We will repeat these to look for any changes or progression of her MS. The patient continues to have ongoing neck pain. Once she has repeated the MRI of the cervical spine we will consider sending the patient for steroid injections. The patient would like to wean off of doxepin. She will decrease her dose to 25 mg at bedtime for 2-3 weeks. We will increase gabapentin to 400 mg at bedtime. After 2 or 3 weeks if she is tolerating this well she will call our office and we will continue to decrease doxepin. Patient is amenable to this plan. She will follow-up in 6 months with Dr. Jannifer Franklin.  I spent 25 minutes with the patient 50% of this time was spent discussing medication adjustments.    Ward Givens, MSN, NP-C 01/13/2017, 9:44 AM [facility] [address], [place], [place] [address] [phone]

## 2017-01-13 NOTE — Patient Instructions (Addendum)
Continue Tysabri Continue gabapentin for neck pain  Blood work today MRI brain and cervical spine If your symptoms worsen or you develop new symptoms please let us know.

## 2017-01-14 ENCOUNTER — Telehealth: Payer: Self-pay | Admitting: *Deleted

## 2017-01-14 LAB — COMPREHENSIVE METABOLIC PANEL
ALT: 20 IU/L (ref 0–32)
AST: 20 IU/L (ref 0–40)
Albumin/Globulin Ratio: 1.8 (ref 1.2–2.2)
Albumin: 4.5 g/dL (ref 3.5–5.5)
Alkaline Phosphatase: 103 IU/L (ref 39–117)
BUN/Creatinine Ratio: 23 (ref 9–23)
BUN: 16 mg/dL (ref 6–24)
Bilirubin Total: 0.3 mg/dL (ref 0.0–1.2)
CO2: 27 mmol/L (ref 18–29)
CREATININE: 0.71 mg/dL (ref 0.57–1.00)
Calcium: 9.4 mg/dL (ref 8.7–10.2)
Chloride: 104 mmol/L (ref 96–106)
GFR calc Af Amer: 112 mL/min/{1.73_m2} (ref >59)
GFR calc non Af Amer: 98 mL/min/{1.73_m2} (ref >59)
GLOBULIN, TOTAL: 2.5 g/dL (ref 1.5–4.5)
Glucose: 94 mg/dL (ref 65–99)
Potassium: 5 mmol/L (ref 3.5–5.2)
Sodium: 142 mmol/L (ref 134–144)
Total Protein: 7 g/dL (ref 6.0–8.5)

## 2017-01-14 LAB — CBC WITH DIFFERENTIAL/PLATELET
BASOS: 1 %
Basophils Absolute: 0 10*3/uL (ref 0.0–0.2)
EOS (ABSOLUTE): 0.2 10*3/uL (ref 0.0–0.4)
EOS: 3 %
HEMATOCRIT: 36.9 % (ref 34.0–46.6)
Hemoglobin: 12.4 g/dL (ref 11.1–15.9)
IMMATURE GRANS (ABS): 0 10*3/uL (ref 0.0–0.1)
IMMATURE GRANULOCYTES: 0 %
LYMPHS: 46 %
Lymphocytes Absolute: 3.2 10*3/uL — ABNORMAL HIGH (ref 0.7–3.1)
MCH: 29.4 pg (ref 26.6–33.0)
MCHC: 33.6 g/dL (ref 31.5–35.7)
MCV: 87 fL (ref 79–97)
Monocytes Absolute: 0.5 10*3/uL (ref 0.1–0.9)
Monocytes: 8 %
NEUTROS PCT: 42 %
Neutrophils Absolute: 2.8 10*3/uL (ref 1.4–7.0)
PLATELETS: 202 10*3/uL (ref 150–379)
RBC: 4.22 x10E6/uL (ref 3.77–5.28)
RDW: 14.5 % (ref 12.3–15.4)
WBC: 6.8 10*3/uL (ref 3.4–10.8)

## 2017-01-14 NOTE — Telephone Encounter (Signed)
-----   Message from Ward Givens, NP sent at 01/14/2017  8:04 AM EDT ----- Lab work is unremarkable. Waiting on jcv. Please call patient with results.

## 2017-01-14 NOTE — Telephone Encounter (Signed)
[facility] Touch re-auth form completed, signed and faxed to [facility] # [phone].

## 2017-01-14 NOTE — Telephone Encounter (Signed)
Spoke to pt and relayed that her lab results are unremarkable.  JCV still pending.  Will call when results for that come in.  Pt verbalized understanding.

## 2017-01-15 DIAGNOSIS — G35 Multiple sclerosis: Secondary | ICD-10-CM | POA: Diagnosis not present

## 2017-01-26 ENCOUNTER — Telehealth: Payer: Self-pay | Admitting: *Deleted

## 2017-01-26 NOTE — Telephone Encounter (Signed)
Requested lab result for JCV done 01-13-17.  Will fax result.

## 2017-01-27 NOTE — Telephone Encounter (Signed)
LMVM for pt on her mobile, ok per DPR that her JCV antibody test came back negative.  She is to call back if questions.

## 2017-01-27 NOTE — Telephone Encounter (Signed)
Noted. Please make patient aware.  

## 2017-01-27 NOTE — Telephone Encounter (Signed)
Received fax from [facility].  JCV results: NEGATIVE, INDEX 0.13.

## 2017-01-28 ENCOUNTER — Ambulatory Visit (INDEPENDENT_AMBULATORY_CARE_PROVIDER_SITE_OTHER): Payer: 59

## 2017-01-28 DIAGNOSIS — G35 Multiple sclerosis: Secondary | ICD-10-CM

## 2017-01-28 MED ORDER — GADOPENTETATE DIMEGLUMINE 469.01 MG/ML IV SOLN: 20.0000 mL | Freq: Once | INTRAVENOUS | Status: DC | PRN

## 2017-01-29 ENCOUNTER — Telehealth: Payer: Self-pay | Admitting: Neurology

## 2017-01-29 NOTE — Telephone Encounter (Signed)
  I called patient. MRI of the brain and cervical spine appear to be stable from 1 year ago, the patient remains on Tysabri.  MRI brain 01/29/17:  IMPRESSION:  This MRI of the brain with and without contrast shows the following: 1.    Multiple T2/FLAIR hyperintense foci in the cerebellum, brainstem, thalamus and hemispheres in a pattern and configuration consistent with chronic demyelinating plaque associated with multiple sclerosis. None of the foci appears to be acute. When compared to the MRI dated 02/20/2016, there is no interval change. 2.    There is a normal enhancement pattern.    MRI cervical 01/29/17:  IMPRESSION:  This MRI of the cervical spine with and without contrast shows the following: 1.    There are several T2 hyperintense foci within the spinal cord from C2 to C4-C5 as detailed above. None of these appear to be acute and there are no definite changes when compared to the previous MRI. 2.    There is spinal stenosis at C4-C5, C5-C6 and C6-C7 due to degenerative changes. There is potential for right C5 nerve root compression at C4-C5, right C6 nerve root compression at C5-C6 and left C7 nerve root compression at C6-C7.  Compared to the previous MRI, the degenerative changes are essentially unchanged. 3.    There is a normal enhancement pattern.

## 2017-01-31 DIAGNOSIS — G4733 Obstructive sleep apnea (adult) (pediatric): Secondary | ICD-10-CM | POA: Diagnosis not present

## 2017-02-04 DIAGNOSIS — G4733 Obstructive sleep apnea (adult) (pediatric): Secondary | ICD-10-CM | POA: Diagnosis not present

## 2017-02-10 ENCOUNTER — Other Ambulatory Visit: Payer: Self-pay | Admitting: *Deleted

## 2017-02-10 MED ORDER — ARMODAFINIL 150 MG PO TABS: 150.0000 mg | ORAL_TABLET | Freq: Every day | ORAL | 5 refills | Status: DC

## 2017-02-12 DIAGNOSIS — G35 Multiple sclerosis: Secondary | ICD-10-CM | POA: Diagnosis not present

## 2017-03-11 DIAGNOSIS — G35 Multiple sclerosis: Secondary | ICD-10-CM | POA: Diagnosis not present

## 2017-04-09 DIAGNOSIS — G35 Multiple sclerosis: Secondary | ICD-10-CM | POA: Diagnosis not present

## 2017-05-06 DIAGNOSIS — Z23 Encounter for immunization: Secondary | ICD-10-CM | POA: Diagnosis not present

## 2017-05-07 DIAGNOSIS — G35 Multiple sclerosis: Secondary | ICD-10-CM | POA: Diagnosis not present

## 2017-05-20 DIAGNOSIS — H20023 Recurrent acute iridocyclitis, bilateral: Secondary | ICD-10-CM | POA: Diagnosis not present

## 2017-05-28 DIAGNOSIS — H20023 Recurrent acute iridocyclitis, bilateral: Secondary | ICD-10-CM | POA: Diagnosis not present

## 2017-06-03 DIAGNOSIS — G4733 Obstructive sleep apnea (adult) (pediatric): Secondary | ICD-10-CM | POA: Diagnosis not present

## 2017-06-03 DIAGNOSIS — G35 Multiple sclerosis: Secondary | ICD-10-CM | POA: Diagnosis not present

## 2017-06-13 DIAGNOSIS — J209 Acute bronchitis, unspecified: Secondary | ICD-10-CM | POA: Diagnosis not present

## 2017-06-15 ENCOUNTER — Telehealth: Payer: Self-pay

## 2017-06-15 MED ORDER — DOXEPIN HCL 25 MG PO CAPS: 25.0000 mg | ORAL_CAPSULE | Freq: Every day | ORAL | 0 refills | Status: DC

## 2017-06-15 NOTE — Telephone Encounter (Signed)
[facility]  

## 2017-06-23 ENCOUNTER — Telehealth: Payer: Self-pay | Admitting: *Deleted

## 2017-06-23 DIAGNOSIS — Z0000 Encounter for general adult medical examination without abnormal findings: Secondary | ICD-10-CM | POA: Diagnosis not present

## 2017-06-23 DIAGNOSIS — Z23 Encounter for immunization: Secondary | ICD-10-CM | POA: Diagnosis not present

## 2017-06-23 NOTE — Telephone Encounter (Signed)
Completed/signed [facility] touch tysabir re-auth faxed to [phone]. Received fax confirmation.

## 2017-06-24 DIAGNOSIS — G4733 Obstructive sleep apnea (adult) (pediatric): Secondary | ICD-10-CM | POA: Diagnosis not present

## 2017-06-24 NOTE — Telephone Encounter (Signed)
Received fax notification from Touch prescribing program re: Tysabri that patient authorized from 07/14/17-01/12/18.  Pt enrollment number: [id number] Account: [facility] Site Authorization number: [id number]

## 2017-07-09 DIAGNOSIS — G35 Multiple sclerosis: Secondary | ICD-10-CM | POA: Diagnosis not present

## 2017-07-21 ENCOUNTER — Telehealth: Payer: Self-pay | Admitting: *Deleted

## 2017-07-21 NOTE — Telephone Encounter (Signed)
Called and spoke w/ Sue(on DPR). She will have pt call to r/s appt. She is aware appt cx d/t inclement weather. I offered for appt to be r/s to tomorrow at 5pm. She states pt flying right now. She will have to have pt r/s her appt. I also LVM on pt cell #.

## 2017-07-22 ENCOUNTER — Ambulatory Visit: Payer: 59 | Admitting: Neurology

## 2017-07-23 ENCOUNTER — Encounter: Payer: Self-pay | Admitting: Neurology

## 2017-07-23 ENCOUNTER — Ambulatory Visit: Payer: 59 | Admitting: Neurology

## 2017-07-23 VITALS — BP 149/84 | HR 63 | Wt 268.0 lb

## 2017-07-23 DIAGNOSIS — G35 Multiple sclerosis: Secondary | ICD-10-CM

## 2017-07-23 DIAGNOSIS — M4722 Other spondylosis with radiculopathy, cervical region: Secondary | ICD-10-CM | POA: Diagnosis not present

## 2017-07-23 DIAGNOSIS — Z5181 Encounter for therapeutic drug level monitoring: Secondary | ICD-10-CM | POA: Diagnosis not present

## 2017-07-23 MED ORDER — GABAPENTIN 300 MG PO CAPS: ORAL_CAPSULE | ORAL | 3 refills | Status: DC

## 2017-07-23 NOTE — Progress Notes (Signed)
Reason for visit: Multiple sclerosis  [name] is an 56 y.o. female  History of present illness:  [name] is a 56 year old right-handed white female with a history of multiple sclerosis.  The patient has obesity and sleep apnea, she currently is on CPAP, followed by Dr. Maxwell Caul.  The patient continues to complain of neck stiffness, and some discomfort down both arms, left greater than right.  She may wake up with left hand numbness at night.  She has some pain around the right thumb and discomfort into the elbow on the right.  She feels that the right arm is slightly weaker.  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  The patient denies any new symptoms with the multiple sclerosis, she has not had any numbness, weakness, balance changes or changes in bowel or bladder function.  The patient has had MRI evaluation of the brain and cervical spine in June 2018 that showed good stability.  She continues on Tysabri, she tolerates the medications well.  She takes gabapentin 600 mg at night, she does not take medication during the daytime currently.  She returns for an evaluation.  Past Medical History:  Diagnosis Date  . Asthma   . Back pain   . Cardiac/pericardial tamponade 06/11/2014   s/p pericardial window 06/16/2014 per Dr. Darcey Nora  . Depression   . Hypertension   . MS (multiple sclerosis) ([place])   . Muscle pain   . Obesity   . Periodic limb movement disorder (PLMD) 07/15/2016  . Pleural effusion, bilateral 06/30/2014  . Uveitis    Bilateral    Past Surgical History:  Procedure Laterality Date  . PERICARDIAL TAP N/A 06/11/2014   Procedure: PERICARDIAL TAP;  Surgeon: Blane Ohara, MD;  Location: [facility] CATH LAB;  Service: Cardiovascular;  Laterality: N/A;  . SUBXYPHOID PERICARDIAL WINDOW N/A 06/16/2014   Procedure: SUBXYPHOID PERICARDIAL WINDOW;  Surgeon: Ivin Poot, MD;  Location:  [place];  Service: Thoracic;  Laterality: N/A;  . TEE WITHOUT CARDIOVERSION N/A 06/16/2014   Procedure: TRANSESOPHAGEAL ECHOCARDIOGRAM (TEE);  Surgeon: Ivin Poot, MD;  Location: [facility] OR;  Service: Thoracic;  Laterality: N/A;  . uterine ablasion  2012    Family History  Problem Relation Age of Onset  . Breast cancer Mother   . Hypertension Mother   . Diabetes Mother   . Heart attack Father   . Epilepsy Father   . Endocrine tumor Brother     Social history:  reports that  has never smoked. she has never used smokeless tobacco. She reports that she drinks alcohol. She reports that she does not use drugs.    Allergies  Allergen Reactions  . Penicillins Rash    Medications:  Prior to Admission medications   Medication Sig Start Date End Date Taking? Authorizing Provider  Armodafinil 150 MG tablet Take 1 tablet (150 mg total) by mouth daily. 02/10/17   Kathrynn Ducking, MD  doxepin (SINEQUAN) 25 MG capsule Take 1 capsule (25 mg total) at bedtime by mouth. 06/15/17   Ward Givens, NP  escitalopram (LEXAPRO) 10 MG tablet Take 10 mg by mouth daily.    [provider]  gabapentin (NEURONTIN) 100 MG capsule Take 1 capsule (100 mg total) by mouth at bedtime. 01/13/17   Ward Givens, NP  gabapentin (NEURONTIN) 300 MG capsule Take 1 capsule (300 mg total) by mouth at bedtime.  12/22/16   Kathrynn Ducking, MD  ibuprofen (ADVIL,MOTRIN) 200 MG tablet Take 400 mg by mouth every 6 (six) hours as needed.    [provider]  losartan (COZAAR) 50 MG tablet Take 1 tablet by mouth daily. 09/08/15   [provider]  natalizumab 300 mg in sodium chloride 0.9 % 100 mL Inject 300 mg into the vein every 28 (twenty-eight) days.    [provider]  VENTOLIN HFA 108 [phone] Base) MCG/ACT inhaler  12/17/15   [provider]    ROS:  Out of a complete 14 system review of symptoms, the patient complains only of the following symptoms, and all other reviewed systems are  negative.  Runny nose Cough Restless legs, insomnia, sleep apnea, snoring Environmental allergies Joint pain, back pain, neck pain, neck stiffness Numbness  Blood pressure (!) 149/84, pulse 63, weight 268 lb (121.6 kg).  Physical Exam  General: The patient is alert and cooperative at the time of the examination.  The patient is markedly obese.   Neuromuscular: The patient lacks about 15 degrees of full lateral rotation of the cervical spine bilaterally.  Skin: No significant peripheral edema is noted.   Neurologic Exam  Mental status: The patient is alert and oriented x 3 at the time of the examination. The patient has apparent normal recent and remote memory, with an apparently normal attention span and concentration ability.   Cranial nerves: Facial symmetry is present. Speech is normal, no aphasia or dysarthria is noted. Extraocular movements are full. Visual fields are full.  Pupils are equal, round, and reactive to light.  Discs are flat bilaterally.  Motor: The patient has good strength in all 4 extremities.  Sensory examination: Soft touch sensation is symmetric on the face, but is slightly decreased on the left arm and leg as compared to the right.  Coordination: The patient has good finger-nose-finger and heel-to-shin bilaterally.  Gait and station: The patient has a normal gait. Tandem gait is normal. Romberg is negative. No drift is seen.  Reflexes: Deep tendon reflexes are symmetric.   MRI cervical 01/28/17:  IMPRESSION:  This MRI of the cervical spine with and without contrast shows the following: 1.    There are several T2 hyperintense foci within the spinal cord from C2 to C4-C5 as detailed above. None of these appear to be acute and there are no definite changes when compared to the previous MRI. 2.    There is spinal stenosis at C4-C5, C5-C6 and C6-C7 due to degenerative changes. There is potential for right C5 nerve root compression at C4-C5, right C6 nerve  root compression at C5-C6 and left C7 nerve root compression at C6-C7.  Compared to the previous MRI, the degenerative changes are essentially unchanged. 3.    There is a normal enhancement pattern.  * MRI scan images were reviewed online. I agree with the written report.   MRI brain 01/28/17:  IMPRESSION:  This MRI of the brain with and without contrast shows the following: 1.    Multiple T2/FLAIR hyperintense foci in the cerebellum, brainstem, thalamus and hemispheres in a pattern and configuration consistent with chronic demyelinating plaque associated with multiple sclerosis. None of the foci appears to be acute. When compared to the MRI dated 02/20/2016, there is no interval change. 2.    There is a normal enhancement pattern.  * MRI scan images were reviewed online. I agree with the written report.     Assessment/Plan:  1.  Chronic neck pain,  cervical spondylosis  2.  Multiple sclerosis  The patient will be increased on the gabapentin taking 300 mg in the morning and noon, 600 mg in the evening.  A prescription was sent in.  The patient will be set up for an epidural steroid injection of the cervical spine.  The patient will continue the Tysabri, she will have blood work done today.  She will follow-up in 6 months.  If the arm symptoms persist after the epidural injection, we may consider EMG and nerve conduction study evaluation in the future.  Jill Alexanders MD 07/23/2017 9:00 AM  [facility] [address] [place], [place] [address]  Phone [phone] Fax [phone]

## 2017-07-23 NOTE — Progress Notes (Signed)
Placed JCV lab in [facility] lock box for routine pick up.

## 2017-07-23 NOTE — Patient Instructions (Signed)
   We will increase the gabapentin to 300 mg twice during the day and 2 at night.  We will get a cervical epidural injection set up.  Neurontin (gabapentin) may result in drowsiness, ankle swelling, gait instability, or possibly dizziness. Please contact our office if significant side effects occur with this medication.

## 2017-07-24 LAB — COMPREHENSIVE METABOLIC PANEL
A/G RATIO: 1.5 (ref 1.2–2.2)
ALT: 12 IU/L (ref 0–32)
AST: 17 IU/L (ref 0–40)
Albumin: 4.2 g/dL (ref 3.5–5.5)
Alkaline Phosphatase: 99 IU/L (ref 39–117)
BUN/Creatinine Ratio: 27 — ABNORMAL HIGH (ref 9–23)
BUN: 18 mg/dL (ref 6–24)
Bilirubin Total: <0.2 mg/dL (ref 0.0–1.2)
CALCIUM: 9.2 mg/dL (ref 8.7–10.2)
CO2: 23 mmol/L (ref 20–29)
CREATININE: 0.66 mg/dL (ref 0.57–1.00)
Chloride: 105 mmol/L (ref 96–106)
GFR calc Af Amer: 116 mL/min/{1.73_m2} (ref >59)
GFR, EST NON AFRICAN AMERICAN: 100 mL/min/{1.73_m2} (ref >59)
GLUCOSE: 94 mg/dL (ref 65–99)
Globulin, Total: 2.8 g/dL (ref 1.5–4.5)
POTASSIUM: 4.2 mmol/L (ref 3.5–5.2)
Sodium: 140 mmol/L (ref 134–144)
TOTAL PROTEIN: 7 g/dL (ref 6.0–8.5)

## 2017-07-24 LAB — CBC WITH DIFFERENTIAL/PLATELET
BASOS: 1 %
Basophils Absolute: 0 10*3/uL (ref 0.0–0.2)
EOS (ABSOLUTE): 0.2 10*3/uL (ref 0.0–0.4)
Eos: 2 %
HEMOGLOBIN: 12.2 g/dL (ref 11.1–15.9)
Hematocrit: 36.9 % (ref 34.0–46.6)
IMMATURE GRANS (ABS): 0 10*3/uL (ref 0.0–0.1)
IMMATURE GRANULOCYTES: 0 %
LYMPHS: 45 %
Lymphocytes Absolute: 3.4 10*3/uL — ABNORMAL HIGH (ref 0.7–3.1)
MCH: 29.3 pg (ref 26.6–33.0)
MCHC: 33.1 g/dL (ref 31.5–35.7)
MCV: 89 fL (ref 79–97)
MONOCYTES: 9 %
Monocytes Absolute: 0.6 10*3/uL (ref 0.1–0.9)
NEUTROS PCT: 43 %
Neutrophils Absolute: 3.3 10*3/uL (ref 1.4–7.0)
PLATELETS: 203 10*3/uL (ref 150–379)
RBC: 4.17 x10E6/uL (ref 3.77–5.28)
RDW: 14.3 % (ref 12.3–15.4)
WBC: 7.6 10*3/uL (ref 3.4–10.8)

## 2017-07-26 ENCOUNTER — Other Ambulatory Visit: Payer: Self-pay | Admitting: Adult Health

## 2017-07-27 ENCOUNTER — Other Ambulatory Visit: Payer: Self-pay | Admitting: Neurology

## 2017-07-27 DIAGNOSIS — M5412 Radiculopathy, cervical region: Secondary | ICD-10-CM

## 2017-07-31 ENCOUNTER — Telehealth: Payer: Self-pay | Admitting: Neurology

## 2017-07-31 NOTE — Telephone Encounter (Signed)
The JC viral antibody panel was negative, index was 0.09.

## 2017-08-10 ENCOUNTER — Ambulatory Visit
Admission: RE | Admit: 2017-08-10 | Discharge: 2017-08-10 | Disposition: A | Discharge status: Home / Self Care | Payer: 59 | Source: Ambulatory Visit | Attending: Neurology | Admitting: Neurology

## 2017-08-10 DIAGNOSIS — M5412 Radiculopathy, cervical region: Secondary | ICD-10-CM

## 2017-08-10 DIAGNOSIS — M47812 Spondylosis without myelopathy or radiculopathy, cervical region: Secondary | ICD-10-CM | POA: Diagnosis not present

## 2017-08-10 MED ORDER — TRIAMCINOLONE ACETONIDE 40 MG/ML IJ SUSP (RADIOLOGY)
60.0000 mg | Freq: Once | INTRAMUSCULAR | Status: AC
  Administered 2017-08-10: 60 mg via EPIDURAL

## 2017-08-10 MED ORDER — IOPAMIDOL (ISOVUE-M 300) INJECTION 61%
1.0000 mL | Freq: Once | INTRAMUSCULAR | Status: AC | PRN
  Administered 2017-08-10: 1 mL via EPIDURAL

## 2017-08-10 NOTE — Discharge Instructions (Signed)

## 2017-08-11 HISTORY — PX: OTHER SURGICAL HISTORY: SHX169

## 2017-08-13 DIAGNOSIS — G35 Multiple sclerosis: Secondary | ICD-10-CM | POA: Diagnosis not present

## 2017-08-18 DIAGNOSIS — I10 Essential (primary) hypertension: Secondary | ICD-10-CM | POA: Diagnosis not present

## 2017-08-18 DIAGNOSIS — R0789 Other chest pain: Secondary | ICD-10-CM | POA: Diagnosis not present

## 2017-08-18 DIAGNOSIS — K2900 Acute gastritis without bleeding: Secondary | ICD-10-CM | POA: Diagnosis not present

## 2017-08-19 ENCOUNTER — Ambulatory Visit: Payer: 59 | Admitting: Adult Health

## 2017-08-19 ENCOUNTER — Encounter: Payer: Self-pay | Admitting: Adult Health

## 2017-08-19 VITALS — BP 130/84 | HR 64 | Ht 70.0 in | Wt 259.6 lb

## 2017-08-19 DIAGNOSIS — R002 Palpitations: Secondary | ICD-10-CM | POA: Diagnosis not present

## 2017-08-19 DIAGNOSIS — I313 Pericardial effusion (noninflammatory): Secondary | ICD-10-CM

## 2017-08-19 DIAGNOSIS — I519 Heart disease, unspecified: Secondary | ICD-10-CM

## 2017-08-19 DIAGNOSIS — I3139 Other pericardial effusion (noninflammatory): Secondary | ICD-10-CM

## 2017-08-19 NOTE — Progress Notes (Signed)
Cardiology Office Note   Date:  08/19/2017   ID:  [name], DOB [birth year], MRN [id number]  PCP:  Leeroy Cha, MD  Cardiologist: Drenda Freeze MD  Chief Complaint  Patient presents with  . Nausea    abnormal EKG  . Dizziness  . Fatigue     History of Present Illness: [name] is a 57 y.o. female who presents for ongoing assessment and management prior history of pericardial effusion from acute pericarditis which required pericardial window in November 2015, also has a history of hypertension, and multiple sclerosis.  She was last seen by Dr. Sallyanne Kuster on 08/17/2015.  At that time she was stable from a cardiac standpoint, blood pressure was well controlled, and no new medications were prescribed.  She was to follow-up in 1 year.  She comes today with profound fatigue. She has had some left sided chest discomfort with some radiation to the left neck. She has had constant low grade nausea for a month. She also states she is not sleeping at night due to neck and back pain. She has some palpitations as well.   She was seen by her PCP in December with EKG revealing occasional PVC;s. No acute ST T wave abnormalities. Due to this and her symptoms, she was referred back to cardiology.   She denies DOE, chest pain with exertion, or dizziness.   Past Medical History:  Diagnosis Date  . Asthma   . Back pain   . Cardiac/pericardial tamponade 06/11/2014   s/p pericardial window 06/16/2014 per Dr. Darcey Nora  . Depression   . Hypertension   . MS (multiple sclerosis) ([place])   . Muscle pain   . Obesity   . Periodic limb movement disorder (PLMD) 07/15/2016  . Pleural effusion, bilateral 06/30/2014  . Uveitis    Bilateral    Past Surgical History:  Procedure Laterality Date  . PERICARDIAL TAP N/A 06/11/2014   Procedure: PERICARDIAL TAP;  Surgeon: Blane Ohara, MD;  Location: [facility] CATH LAB;  Service: Cardiovascular;  Laterality: N/A;  . SUBXYPHOID PERICARDIAL WINDOW N/A 06/16/2014     Procedure: SUBXYPHOID PERICARDIAL WINDOW;  Surgeon: Ivin Poot, MD;  Location: [facility];  Service: Thoracic;  Laterality: N/A;  . TEE WITHOUT CARDIOVERSION N/A 06/16/2014   Procedure: TRANSESOPHAGEAL ECHOCARDIOGRAM (TEE);  Surgeon: Ivin Poot, MD;  Location: [facility];  Service: Thoracic;  Laterality: N/A;  . uterine ablasion  2012     Current Outpatient Medications  Medication Sig Dispense Refill  . Armodafinil 150 MG tablet Take 1 tablet (150 mg total) by mouth daily. 30 tablet 5  . doxepin (SINEQUAN) 25 MG capsule Take 1 capsule (25 mg total) at bedtime by mouth. 90 capsule 0  . escitalopram (LEXAPRO) 10 MG tablet Take 10 mg by mouth daily.    Marland Kitchen gabapentin (NEURONTIN) 300 MG capsule One capsule in the morning and noon, 2 at night 360 capsule 3  . ibuprofen (ADVIL,MOTRIN) 200 MG tablet Take 400 mg by mouth every 6 (six) hours as needed.    Marland Kitchen losartan (COZAAR) 50 MG tablet Take 1 tablet by mouth daily.    . natalizumab 300 mg in sodium chloride 0.9 % 100 mL Inject 300 mg into the vein every 28 (twenty-eight) days.    . VENTOLIN HFA 108 (90 Base) MCG/ACT inhaler      No current facility-administered medications for this visit.     Allergies:   Penicillins    Social History:  The patient  reports that  has never  smoked. she has never used smokeless tobacco. She reports that she drinks alcohol. She reports that she does not use drugs.   Family History:  The patient's family history includes Breast cancer in her mother; Diabetes in her mother; Endocrine tumor in her brother; Epilepsy in her father; Heart attack in her father; Hypertension in her mother.    ROS: All other systems are reviewed and negative. Unless otherwise mentioned in H&P    PHYSICAL EXAM: VS:  BP 130/84   Pulse 64   Ht 5\' 10"  (1.778 m)   Wt 259 lb 9.6 oz (117.8 kg)   BMI 37.25 kg/m  , BMI Body mass index is 37.25 kg/m. GEN: Well nourished, well developed, in no acute distress  HEENT: normal  Neck: no JVD,  carotid bruits, or masses Cardiac: RRR; no murmurs, rubs, or gallops,no edema  Respiratory:  clear to auscultation bilaterally, normal work of breathing GI: soft, nontender, nondistended, + BS. Negative Murphy's sign MS: no deformity or atrophy  Skin: warm and dry, no rash Neuro:  Strength and sensation are intact Psych: euthymic mood, full affect   EKG:  NSR with LAE. Rate of 64 bpm.   Recent Labs: 07/23/2017: ALT 12; BUN 18; Creatinine, Ser 0.66; Hemoglobin 12.2; Platelets 203; Potassium 4.2; Sodium 140    Lipid Panel No results found for: CHOL, TRIG, HDL, CHOLHDL, VLDL, LDLCALC, LDLDIRECT    Wt Readings from Last 3 Encounters:  08/19/17 259 lb 9.6 oz (117.8 kg)  07/23/17 268 lb (121.6 kg)  01/13/17 266 lb 12.8 oz (121 kg)      Other studies Reviewed: Echocardiogram September 13, 2014 Left ventricle: The cavity size was normal. There was mild concentric hypertrophy. Systolic function was vigorous. The estimated ejection fraction was in the range of 65% to 70%. Wall motion was normal; there were no regional wall motion abnormalities. Doppler parameters are consistent with abnormal left ventricular relaxation (grade 1 diastolic dysfunction). There was no evidence of elevated ventricular filling pressure by Doppler parameters. - Aortic valve: Structurally normal valve. Trileaflet. There was no significant regurgitation. - Aortic root: The aortic root was normal in size. - Ascending aorta: The ascending aorta was normal in size. - Mitral valve: Structurally normal valve. - Left atrium: The atrium was normal in size. - Right ventricle: Systolic function was normal. - Tricuspid valve: There was no regurgitation. - Pulmonic valve: There was no regurgitation. - Inferior vena cava: The vessel was normal in size. The respirophasic diameter changes were in the normal range (= 50%), consistent with normal central venous pressure. - Pericardium, extracardiac: A  trivial pericardial effusion was identified. Features were not consistent with tamponade physiology.   ASSESSMENT AND PLAN:  1. Recurrent chest pain: EKG does not reveal any new abnormalities. NSR with no evidence of pericarditis, T-wave abnormality, or arrhythmia. Will repeat her echocardiogram for changes in LV function. I think she would benefit from a stress test or NM stress myoview. Will wait on that until labs which were drawn by PCP on 08/18/2016 are reviewed and echo is resulted. If everything is normal then would proceed with ischemic testing.   2. Hx of pericarditis: She states the pain she is feeling is reminiscent of pericarditis pain. No evidence of this on exam.   3. Chronic nausea: She is being worked up by PCP for this. She is negative for Murphy;s sign on examination.    Current medicines are reviewed at length with the patient today.    Labs/ tests ordered today include: Echo  Phill Myron. [place] Pugh, ANP, AACC   08/19/2017 2:39 PM    [facility] [address] [place], [place] [address] Phone: [phone]; Fax: [phone]

## 2017-08-19 NOTE — Patient Instructions (Signed)
Medication Instructions:  No changes-Your physician recommends that you continue on your current medications as directed. Please refer to the Current Medication list given to you today.  If you need a refill on your cardiac medications before your next appointment, please call your pharmacy.  Testing/Procedures: Echocardiogram - Your physician has requested that you have an echocardiogram. Echocardiography is a painless test that uses sound waves to create images of your heart. It provides your doctor with information about the size and shape of your heart and how well your heart's chambers and valves are working. This procedure takes approximately one hour. There are no restrictions for this procedure. This will be performed at our [facility] location - [address].  Follow-Up: Your physician wants you to follow-up: WITH DR Sallyanne Kuster AFTER ECHO    Thank you for choosing [facility] at [facility]!!

## 2017-08-20 NOTE — Progress Notes (Signed)
Okay, will keep my eyes open for echo labs, neither back yet MCr

## 2017-08-21 ENCOUNTER — Other Ambulatory Visit: Payer: Self-pay | Admitting: Neurology

## 2017-09-04 ENCOUNTER — Other Ambulatory Visit (HOSPITAL_COMMUNITY): Payer: 59

## 2017-09-07 ENCOUNTER — Other Ambulatory Visit: Payer: Self-pay

## 2017-09-07 ENCOUNTER — Ambulatory Visit (HOSPITAL_COMMUNITY): Payer: 59 | Attending: Cardiology

## 2017-09-07 DIAGNOSIS — R002 Palpitations: Secondary | ICD-10-CM | POA: Diagnosis not present

## 2017-09-07 DIAGNOSIS — I519 Heart disease, unspecified: Secondary | ICD-10-CM

## 2017-09-10 DIAGNOSIS — G35 Multiple sclerosis: Secondary | ICD-10-CM | POA: Diagnosis not present

## 2017-10-01 DIAGNOSIS — M542 Cervicalgia: Secondary | ICD-10-CM | POA: Diagnosis not present

## 2017-10-01 DIAGNOSIS — M5412 Radiculopathy, cervical region: Secondary | ICD-10-CM | POA: Diagnosis not present

## 2017-10-01 DIAGNOSIS — M5136 Other intervertebral disc degeneration, lumbar region: Secondary | ICD-10-CM | POA: Diagnosis not present

## 2017-10-01 DIAGNOSIS — M545 Low back pain: Secondary | ICD-10-CM | POA: Diagnosis not present

## 2017-10-06 ENCOUNTER — Telehealth: Payer: Self-pay | Admitting: *Deleted

## 2017-10-06 NOTE — Telephone Encounter (Signed)
Unable to complete PA on covermymeds. Called patient insurance yesterday to initiate PA armodafinil. Was advised I need to contact [employer] to do PA. Contacted [employer] and advised she has a PA case manager and I needed to contact [phone]. Went to VM.   Was advised to call [phone]. I called and got automated message to go to [email or web address] and download PA form. No PA's could be done via phone anymore. I downloaded form and faxed completed/signed form. Waiting on determination.

## 2017-10-08 ENCOUNTER — Encounter: Payer: Self-pay | Admitting: Neurology

## 2017-10-08 ENCOUNTER — Telehealth: Payer: Self-pay | Admitting: *Deleted

## 2017-10-08 DIAGNOSIS — G35 Multiple sclerosis: Secondary | ICD-10-CM | POA: Diagnosis not present

## 2017-10-08 NOTE — Telephone Encounter (Signed)
Pt in office today getting tysabri infusion. She requested letter for employer recommending ergonomic desk.

## 2017-10-08 NOTE — Telephone Encounter (Signed)
Gave printed/signed letter to patient while in office.

## 2017-10-08 NOTE — Telephone Encounter (Signed)
Received fax notification from [employer] that PA armodafinil approved 10/05/17-10/05/18 for 12 fills qty 30 for each fill.   Member ID: [id number], [facility] number: [id number]  Fax approval notice to [facility] at [phone]. Received fax notification.

## 2017-10-08 NOTE — Telephone Encounter (Signed)
I will write a letter.

## 2017-10-10 DIAGNOSIS — M542 Cervicalgia: Secondary | ICD-10-CM | POA: Diagnosis not present

## 2017-10-13 DIAGNOSIS — G4733 Obstructive sleep apnea (adult) (pediatric): Secondary | ICD-10-CM | POA: Diagnosis not present

## 2017-10-14 ENCOUNTER — Telehealth: Payer: Self-pay | Admitting: *Deleted

## 2017-10-14 NOTE — Telephone Encounter (Signed)
Received fax request from [employer] asking for 90 days supply for gabapentin.  Dr. Jannifer Franklin sent refill 07/23/17 qty 360 with [facility] refills to [facility]   I called pt, LVM. I wanted to verify if she wants refills to go to [employer] or not. Advised rx was sent to her local pharmacy at last office visit.

## 2017-10-15 DIAGNOSIS — M5136 Other intervertebral disc degeneration, lumbar region: Secondary | ICD-10-CM | POA: Diagnosis not present

## 2017-10-19 DIAGNOSIS — M50123 Cervical disc disorder at C6-C7 level with radiculopathy: Secondary | ICD-10-CM | POA: Diagnosis not present

## 2017-10-19 DIAGNOSIS — M5010 Cervical disc disorder with radiculopathy, unspecified cervical region: Secondary | ICD-10-CM | POA: Insufficient documentation

## 2017-10-19 DIAGNOSIS — M50323 Other cervical disc degeneration at C6-C7 level: Secondary | ICD-10-CM | POA: Diagnosis not present

## 2017-10-26 DIAGNOSIS — R410 Disorientation, unspecified: Secondary | ICD-10-CM | POA: Diagnosis not present

## 2017-10-26 DIAGNOSIS — M62830 Muscle spasm of back: Secondary | ICD-10-CM | POA: Diagnosis not present

## 2017-10-26 DIAGNOSIS — M5440 Lumbago with sciatica, unspecified side: Secondary | ICD-10-CM | POA: Diagnosis not present

## 2017-11-05 DIAGNOSIS — G35 Multiple sclerosis: Secondary | ICD-10-CM | POA: Diagnosis not present

## 2017-11-09 DIAGNOSIS — G35 Multiple sclerosis: Secondary | ICD-10-CM | POA: Diagnosis not present

## 2017-11-09 DIAGNOSIS — M5030 Other cervical disc degeneration, unspecified cervical region: Secondary | ICD-10-CM | POA: Diagnosis not present

## 2017-11-09 DIAGNOSIS — M5441 Lumbago with sciatica, right side: Secondary | ICD-10-CM | POA: Diagnosis not present

## 2017-11-10 DIAGNOSIS — M545 Low back pain: Secondary | ICD-10-CM | POA: Diagnosis not present

## 2017-11-10 DIAGNOSIS — M542 Cervicalgia: Secondary | ICD-10-CM | POA: Diagnosis not present

## 2017-11-12 ENCOUNTER — Telehealth: Payer: Self-pay | Admitting: Cardiovascular Disease

## 2017-11-12 NOTE — Telephone Encounter (Signed)
Closed Encounter  °

## 2017-11-17 DIAGNOSIS — M545 Low back pain: Secondary | ICD-10-CM | POA: Diagnosis not present

## 2017-11-20 ENCOUNTER — Ambulatory Visit: Payer: 59 | Admitting: Cardiovascular Disease

## 2017-11-23 ENCOUNTER — Encounter: Payer: Self-pay | Admitting: *Deleted

## 2017-11-26 ENCOUNTER — Other Ambulatory Visit: Payer: Self-pay | Admitting: Internal Medicine

## 2017-11-26 DIAGNOSIS — Z139 Encounter for screening, unspecified: Secondary | ICD-10-CM

## 2017-11-28 ENCOUNTER — Other Ambulatory Visit: Payer: Self-pay | Admitting: Neurology

## 2017-12-03 DIAGNOSIS — G35 Multiple sclerosis: Secondary | ICD-10-CM | POA: Diagnosis not present

## 2017-12-08 ENCOUNTER — Telehealth: Payer: Self-pay | Admitting: *Deleted

## 2017-12-08 DIAGNOSIS — M542 Cervicalgia: Secondary | ICD-10-CM | POA: Diagnosis not present

## 2017-12-08 DIAGNOSIS — M5126 Other intervertebral disc displacement, lumbar region: Secondary | ICD-10-CM | POA: Diagnosis not present

## 2017-12-08 DIAGNOSIS — M5137 Other intervertebral disc degeneration, lumbosacral region: Secondary | ICD-10-CM | POA: Diagnosis not present

## 2017-12-08 DIAGNOSIS — M545 Low back pain: Secondary | ICD-10-CM | POA: Diagnosis not present

## 2017-12-08 NOTE — Telephone Encounter (Signed)
Left voicemail for patient to return call.

## 2017-12-08 NOTE — Telephone Encounter (Signed)
   [facility] Pre-operative Risk Assessment    Request for surgical clearance:  1. What type of surgery is being performed? L4-L5 MICRODISECECTOMY  2. When is this surgery scheduled? 12/18/17  3. What type of clearance is required (medical clearance vs. Pharmacy clearance to hold med vs. Both)? MEDICAL  4. Are there any medications that need to be held prior to surgery and how long? NA  5. Practice name and name of physician performing surgery? [place] Ronnald Ramp  6. What is your office phone number  [phone]   7.   What is your office fax number [phone]  8.   Anesthesia type (None, local, MAC, general) ? GENERAL   [name] 12/08/2017, 4:12 PM  _________________________________________________________________   (provider comments below)

## 2017-12-09 ENCOUNTER — Ambulatory Visit: Payer: 59 | Admitting: Cardiovascular Disease

## 2017-12-09 NOTE — Telephone Encounter (Signed)
Pt returning call concerning her clearance. Please call pt.

## 2017-12-09 NOTE — Telephone Encounter (Signed)
   Primary Cardiologist: Sanda Klein, MD  Chart reviewed as part of pre-operative protocol coverage. Given past medical history and time since last visit, based on ACC/AHA guidelines, [name] would be at acceptable risk for the planned procedure without further cardiovascular testing.   Her prior chest discomfort resolved after taking PPI. No reoccurrence. Easily getting > 4 Mets of activity daily.   I will route this recommendation to the requesting party via Epic fax function and remove from pre-op pool.  Please call with questions.  [place], [place] 12/09/2017, 2:02 PM

## 2017-12-09 NOTE — Telephone Encounter (Signed)
Spoke with patient and she is aware cleared.

## 2017-12-14 ENCOUNTER — Telehealth: Payer: Self-pay | Admitting: *Deleted

## 2017-12-14 NOTE — Telephone Encounter (Signed)
Faxed completed/signed Tysabri pt status report and reauth questionnaire to [employer] at [phone]. Received fax confirmation.

## 2017-12-18 DIAGNOSIS — M5126 Other intervertebral disc displacement, lumbar region: Secondary | ICD-10-CM | POA: Diagnosis not present

## 2017-12-21 ENCOUNTER — Ambulatory Visit: Payer: 59

## 2018-01-07 ENCOUNTER — Ambulatory Visit: Payer: 59 | Admitting: Cardiovascular Disease

## 2018-01-11 ENCOUNTER — Ambulatory Visit: Payer: 59

## 2018-01-11 ENCOUNTER — Ambulatory Visit
Admission: RE | Admit: 2018-01-11 | Discharge: 2018-01-11 | Disposition: A | Discharge status: Home / Self Care | Payer: 59 | Source: Ambulatory Visit | Attending: Internal Medicine | Admitting: Internal Medicine

## 2018-01-11 DIAGNOSIS — Z1231 Encounter for screening mammogram for malignant neoplasm of breast: Secondary | ICD-10-CM | POA: Diagnosis not present

## 2018-01-11 DIAGNOSIS — Z139 Encounter for screening, unspecified: Secondary | ICD-10-CM

## 2018-01-12 DIAGNOSIS — G35 Multiple sclerosis: Secondary | ICD-10-CM | POA: Diagnosis not present

## 2018-01-27 DIAGNOSIS — G4733 Obstructive sleep apnea (adult) (pediatric): Secondary | ICD-10-CM | POA: Diagnosis not present

## 2018-01-28 ENCOUNTER — Encounter: Payer: Self-pay | Admitting: Neurology

## 2018-01-28 ENCOUNTER — Ambulatory Visit: Payer: 59 | Admitting: Neurology

## 2018-01-28 ENCOUNTER — Telehealth: Payer: Self-pay | Admitting: Neurology

## 2018-01-28 VITALS — BP 148/88 | HR 83 | Ht 70.0 in | Wt 246.8 lb

## 2018-01-28 DIAGNOSIS — G4761 Periodic limb movement disorder: Secondary | ICD-10-CM | POA: Diagnosis not present

## 2018-01-28 DIAGNOSIS — Z5181 Encounter for therapeutic drug level monitoring: Secondary | ICD-10-CM

## 2018-01-28 DIAGNOSIS — G35 Multiple sclerosis: Secondary | ICD-10-CM | POA: Diagnosis not present

## 2018-01-28 MED ORDER — PRAMIPEXOLE DIHYDROCHLORIDE 0.25 MG PO TABS: 0.2500 mg | ORAL_TABLET | Freq: Every day | ORAL | 3 refills | Status: DC

## 2018-01-28 MED ORDER — ALPRAZOLAM 0.5 MG PO TABS: ORAL_TABLET | ORAL | 0 refills | Status: DC

## 2018-01-28 NOTE — Telephone Encounter (Signed)
lvm for pt to call back about scheduling mri  °[employer] Auth: [place] via [facility] website  °

## 2018-01-28 NOTE — Progress Notes (Signed)
Reason for visit: Multiple sclerosis  [name] is an 57 y.o. female  History of present illness:  [name] is a 58 year old right-handed white female with a history of multiple sclerosis.  Since last seen, she primarily has had issues with her neck and low back.  She has been seen by Dr. Rolena Infante and by Dr. Ronnald Ramp from neurosurgery.  She has had surgery done on her low back within the last month for left-sided sciatica.  This has helped the pain.  She continues to have discomfort in the right arm and some tingling in the left hand associated with bilateral cervical radiculopathies.  She has potential for C5 and C6 nerve root impingement on the right and C7 nerve root impingement on the left by MRI of the cervical spine.  The patient does not believe that she has had any new symptoms associated with her multiple sclerosis.  She has not had any new numbness or weakness of the extremities, she denies issues controlling the bowels or the bladder or any change in balance.  She has not noted any vision changes.  She is having chronic issues with insomnia, she has been told she has periodic limb movement disorder, she will twitch and move all night long, sometimes she will fall out of bed.  She is on CPAP for sleep apnea.  She returns to this office for an evaluation.  Past Medical History:  Diagnosis Date  . Asthma   . Back pain   . Cardiac/pericardial tamponade 06/11/2014   s/p pericardial window 06/16/2014 per Dr. Darcey Nora  . Depression   . Hypertension   . MS (multiple sclerosis) ([place])   . Muscle pain   . Obesity   . Periodic limb movement disorder (PLMD) 07/15/2016  . Pleural effusion, bilateral 06/30/2014  . Uveitis    Bilateral    Past Surgical History:  Procedure Laterality Date  . microdisectomy  2019   lumbar spine  . PERICARDIAL TAP N/A 06/11/2014   Procedure: PERICARDIAL TAP;  Surgeon: Blane Ohara, MD;  Location: [facility] CATH LAB;  Service: Cardiovascular;  Laterality: N/A;  .  SUBXYPHOID PERICARDIAL WINDOW N/A 06/16/2014   Procedure: SUBXYPHOID PERICARDIAL WINDOW;  Surgeon: Ivin Poot, MD;  Location: [facility];  Service: Thoracic;  Laterality: N/A;  . TEE WITHOUT CARDIOVERSION N/A 06/16/2014   Procedure: TRANSESOPHAGEAL ECHOCARDIOGRAM (TEE);  Surgeon: Ivin Poot, MD;  Location: [facility] OR;  Service: Thoracic;  Laterality: N/A;  . uterine ablasion  2012    Family History  Problem Relation Age of Onset  . Breast cancer Mother   . Hypertension Mother   . Diabetes Mother   . Heart attack Father   . Epilepsy Father   . Endocrine tumor Brother     Social history:  reports that she has never smoked. She has never used smokeless tobacco. She reports that she drinks alcohol. She reports that she does not use drugs.    Allergies  Allergen Reactions  . Penicillins Rash    Medications:  Prior to Admission medications   Medication Sig Start Date End Date Taking? Authorizing Provider  Armodafinil 150 MG tablet TAKE 1 TABLET BY MOUTH EVERY DAY 08/21/17  Yes Kathrynn Ducking, MD  atorvastatin (LIPITOR) 10 MG tablet 10 mg daily.   Yes [provider]  escitalopram (LEXAPRO) 10 MG tablet Take 10 mg by mouth daily.   Yes [provider]  gabapentin (NEURONTIN) 300 MG capsule One capsule in the morning and noon, 2  at night 07/23/17  Yes Kathrynn Ducking, MD  ibuprofen (ADVIL,MOTRIN) 200 MG tablet Take 400 mg by mouth every 6 (six) hours as needed.   Yes [provider]  losartan (COZAAR) 50 MG tablet Take 1 tablet by mouth daily. 09/08/15  Yes [provider]  natalizumab 300 mg in sodium chloride 0.9 % 100 mL Inject 300 mg into the vein every 28 (twenty-eight) days.   Yes [provider]  pantoprazole (PROTONIX) 20 MG tablet 20 mg daily. 01/01/18  Yes [provider]  VENTOLIN HFA 108 (90 Base) MCG/ACT inhaler  12/17/15  Yes [provider]  ALPRAZolam Duanne Moron) 0.5 MG tablet Take 2 tablets approximately 45 minutes  prior to the MRI study, take a third tablet if needed. 01/28/18   Kathrynn Ducking, MD  doxepin (SINEQUAN) 25 MG capsule Take 1 capsule (25 mg total) at bedtime by mouth. Patient not taking: Reported on 01/28/2018 06/15/17   Ward Givens, NP  pramipexole (MIRAPEX) 0.25 MG tablet Take 1 tablet (0.25 mg total) by mouth at bedtime. 01/28/18   Kathrynn Ducking, MD  prednisoLONE acetate (PRED FORTE) 1 % ophthalmic suspension As needed    [provider]    ROS:  Out of a complete 14 system review of symptoms, the patient complains only of the following symptoms, and all other reviewed systems are negative.  Restless legs, insomnia, sleep apnea Back pain, neck pain, neck stiffness Environmental allergies Numbness Depression  Blood pressure (!) 148/88, pulse 83, height 5\' 10"  (1.778 m), weight 246 lb 12.8 oz (111.9 kg).  Physical Exam  General: The patient is alert and cooperative at the time of the examination.  Skin: No significant peripheral edema is noted.   Neurologic Exam  Mental status: The patient is alert and oriented x 3 at the time of the examination. The patient has apparent normal recent and remote memory, with an apparently normal attention span and concentration ability.   Cranial nerves: Facial symmetry is present. Speech is normal, no aphasia or dysarthria is noted. Extraocular movements are full. Visual fields are full.  Pupils are equal, round, and reactive to light.  Discs are flat bilaterally.  Motor: The patient has good strength in all 4 extremities.  Sensory examination: Soft touch sensation is symmetric on the face, decreased sensation on the left arm and leg is noted relative to the right.  Coordination: The patient has good finger-nose-finger and heel-to-shin bilaterally.  Gait and station: The patient has a normal gait. Tandem gait is normal. Romberg is negative. No drift is seen.  Reflexes: Deep tendon reflexes are  symmetric.   Assessment/Plan:  1.  Multiple sclerosis  2.  Cervical spondylosis  3.  Periodic limb movement disorder  The patient will be placed on low-dose Mirapex, 0.25 mg at night.  She will call for any dose adjustments.  The patient will be set up for blood work today, she will have MRI of the brain and cervical spine, she will follow-up in 6 months.  She will continue Tysabri.  Jill Alexanders MD 01/28/2018 9:26 AM  [facility] [address] [place], [place] [address]  Phone [phone] Fax [phone]

## 2018-01-28 NOTE — Progress Notes (Signed)
JCV specimen in lock box to be picked up by [facility].

## 2018-01-28 NOTE — Patient Instructions (Signed)
We will get MRI of the brain and cervical spine.  We will start Mirapex for the PLMD.  Mirapex (pramipexole) may result in confusion or hallucinations, dizziness, drowsiness, or nausea. If any significant side effects are noted, please contact our office.

## 2018-01-29 LAB — COMPREHENSIVE METABOLIC PANEL
ALBUMIN: 4.5 g/dL (ref 3.5–5.5)
ALK PHOS: 111 IU/L (ref 39–117)
ALT: 18 IU/L (ref 0–32)
AST: 17 IU/L (ref 0–40)
Albumin/Globulin Ratio: 1.7 (ref 1.2–2.2)
BUN / CREAT RATIO: 25 — AB (ref 9–23)
BUN: 16 mg/dL (ref 6–24)
Bilirubin Total: 0.3 mg/dL (ref 0.0–1.2)
CO2: 22 mmol/L (ref 20–29)
CREATININE: 0.65 mg/dL (ref 0.57–1.00)
Calcium: 9.7 mg/dL (ref 8.7–10.2)
Chloride: 104 mmol/L (ref 96–106)
GFR calc Af Amer: 116 mL/min/{1.73_m2} (ref >59)
GFR, EST NON AFRICAN AMERICAN: 101 mL/min/{1.73_m2} (ref >59)
GLOBULIN, TOTAL: 2.7 g/dL (ref 1.5–4.5)
Glucose: 83 mg/dL (ref 65–99)
Potassium: 4.3 mmol/L (ref 3.5–5.2)
SODIUM: 144 mmol/L (ref 134–144)
Total Protein: 7.2 g/dL (ref 6.0–8.5)

## 2018-01-29 LAB — CBC WITH DIFFERENTIAL/PLATELET
BASOS: 0 %
Basophils Absolute: 0 10*3/uL (ref 0.0–0.2)
EOS (ABSOLUTE): 0.3 10*3/uL (ref 0.0–0.4)
EOS: 4 %
HEMATOCRIT: 39.9 % (ref 34.0–46.6)
Hemoglobin: 13 g/dL (ref 11.1–15.9)
Immature Grans (Abs): 0 10*3/uL (ref 0.0–0.1)
Immature Granulocytes: 0 %
LYMPHS ABS: 3.1 10*3/uL (ref 0.7–3.1)
Lymphs: 47 %
MCH: 28.6 pg (ref 26.6–33.0)
MCHC: 32.6 g/dL (ref 31.5–35.7)
MCV: 88 fL (ref 79–97)
MONOS ABS: 0.6 10*3/uL (ref 0.1–0.9)
Monocytes: 9 %
Neutrophils Absolute: 2.7 10*3/uL (ref 1.4–7.0)
Neutrophils: 40 %
Platelets: 206 10*3/uL (ref 150–450)
RBC: 4.55 x10E6/uL (ref 3.77–5.28)
RDW: 14.2 % (ref 12.3–15.4)
WBC: 6.7 10*3/uL (ref 3.4–10.8)

## 2018-02-04 ENCOUNTER — Telehealth: Payer: Self-pay | Admitting: Neurology

## 2018-02-04 NOTE — Telephone Encounter (Signed)
JC viral antibody panel is negative, index is 0.13

## 2018-02-16 DIAGNOSIS — Z9889 Other specified postprocedural states: Secondary | ICD-10-CM | POA: Diagnosis not present

## 2018-02-16 DIAGNOSIS — J45998 Other asthma: Secondary | ICD-10-CM | POA: Diagnosis not present

## 2018-02-16 DIAGNOSIS — E785 Hyperlipidemia, unspecified: Secondary | ICD-10-CM | POA: Diagnosis not present

## 2018-02-16 DIAGNOSIS — Z1159 Encounter for screening for other viral diseases: Secondary | ICD-10-CM | POA: Diagnosis not present

## 2018-02-18 DIAGNOSIS — G35 Multiple sclerosis: Secondary | ICD-10-CM | POA: Diagnosis not present

## 2018-03-01 ENCOUNTER — Other Ambulatory Visit: Payer: Self-pay | Admitting: *Deleted

## 2018-03-01 MED ORDER — PRAMIPEXOLE DIHYDROCHLORIDE 0.25 MG PO TABS: 0.2500 mg | ORAL_TABLET | Freq: Every day | ORAL | 1 refills | Status: DC

## 2018-03-04 ENCOUNTER — Other Ambulatory Visit: Payer: Self-pay | Admitting: Neurology

## 2018-03-04 NOTE — Telephone Encounter (Signed)
Rx registry checked. Last fill date is 01/13/18 for #30. Last OV was 01/28/18 and next OV is 08/23/18.

## 2018-03-08 DIAGNOSIS — J45998 Other asthma: Secondary | ICD-10-CM | POA: Diagnosis not present

## 2018-03-09 ENCOUNTER — Telehealth: Payer: Self-pay | Admitting: *Deleted

## 2018-03-09 NOTE — Telephone Encounter (Signed)
Received fax notification from [employer] that pt is enrolled in the $0 copay program for Tysabri. If the pt has any changes to therapy, insurance status or medical condition, they are required to contact the program so re-determination can be performed.  Questions: [phone].  

## 2018-03-16 DIAGNOSIS — G35 Multiple sclerosis: Secondary | ICD-10-CM | POA: Diagnosis not present

## 2018-03-29 DIAGNOSIS — M542 Cervicalgia: Secondary | ICD-10-CM | POA: Diagnosis not present

## 2018-04-16 ENCOUNTER — Telehealth: Payer: Self-pay | Admitting: Neurology

## 2018-04-16 DIAGNOSIS — G35 Multiple sclerosis: Secondary | ICD-10-CM | POA: Diagnosis not present

## 2018-04-16 NOTE — Telephone Encounter (Signed)
Patient came in today for an infusion. When she left she stopped by checkout and stated that she had an appointment with Dr. Jannifer Franklin on 6/20 and needed some MRI's. She said that she has yet to hear from anyone. I looked in the notes and told her that she had been called and a voicemail had been left to schedule this. Patient stated that she has no voicemail from [place] and would like to be called again.

## 2018-04-19 NOTE — Telephone Encounter (Signed)
MR Brain w/wo contrast & MR Cervical spine w/wo contrast Dr. Jannifer Franklin [facility] Auth: [place] via [facility] website. Patient is scheduled at [facility] at 04/21/18.

## 2018-04-21 ENCOUNTER — Ambulatory Visit (INDEPENDENT_AMBULATORY_CARE_PROVIDER_SITE_OTHER): Payer: 59

## 2018-04-21 DIAGNOSIS — G35 Multiple sclerosis: Secondary | ICD-10-CM

## 2018-04-21 MED ORDER — GADOBENATE DIMEGLUMINE 529 MG/ML IV SOLN
20.0000 mL | Freq: Once | INTRAVENOUS | Status: AC | PRN
  Administered 2018-04-21: 20 mL via INTRAVENOUS

## 2018-04-22 ENCOUNTER — Telehealth: Payer: Self-pay | Admitting: Neurology

## 2018-04-22 NOTE — Telephone Encounter (Signed)
   I called the patient.  The MRI studies of the neck and brain are stable from prior studies done on 28 January 2017.  I discussed this with the patient.  She will remain on Tysabri.  MRI cervical 04/22/18:  IMPRESSION:   MRI cervical spine (with and without) demonstrating: - Stable chronic demyelinating plaques from cervical medullary junction down to T2 level.  - No acute plaques.  - At C4-5: disc bulging and uncovertebral joint hypertrophy with mild spinal stenosis and severe biforaminal stenosis  - At C6-7: disc bulging and uncovertebral joint hypertrophy with mild spinal stenosis and severe biforaminal stenosis  - At C5-6: uncovertebral joint hypertrophy and facet hypertrophy with severe biforaminal stenosis  - At C3-4: uncovertebral joint hypertrophy and facet hypertrophy with moderate right and mild left foraminal stenosis  - Overall no change from 10/10/17.    MRI brain 04/22/18:  IMPRESSION:   MRI brain (with and without) demonstrating: - Stable supratentorial and infratentorial chronic demyelinating plaques. - No acute plaques.

## 2018-05-25 DIAGNOSIS — G35 Multiple sclerosis: Secondary | ICD-10-CM | POA: Diagnosis not present

## 2018-06-16 DIAGNOSIS — Z1159 Encounter for screening for other viral diseases: Secondary | ICD-10-CM | POA: Diagnosis not present

## 2018-06-16 DIAGNOSIS — Z0000 Encounter for general adult medical examination without abnormal findings: Secondary | ICD-10-CM | POA: Diagnosis not present

## 2018-06-16 DIAGNOSIS — E785 Hyperlipidemia, unspecified: Secondary | ICD-10-CM | POA: Diagnosis not present

## 2018-06-16 DIAGNOSIS — Z23 Encounter for immunization: Secondary | ICD-10-CM | POA: Diagnosis not present

## 2018-06-21 ENCOUNTER — Encounter: Payer: Self-pay | Admitting: *Deleted

## 2018-06-30 DIAGNOSIS — G35 Multiple sclerosis: Secondary | ICD-10-CM | POA: Diagnosis not present

## 2018-07-06 ENCOUNTER — Telehealth: Payer: Self-pay | Admitting: Neurology

## 2018-07-06 MED ORDER — PRAMIPEXOLE DIHYDROCHLORIDE 0.25 MG PO TABS: 0.2500 mg | ORAL_TABLET | Freq: Every day | ORAL | 1 refills | Status: DC

## 2018-07-06 NOTE — Telephone Encounter (Signed)
Patient requesting a new Rx for pramipexole (MIRAPEX) 0.[facility] MG tablet [facility] [address] [facility] [place] She says Dr. Jannifer Franklin had upped the dosage.

## 2018-07-06 NOTE — Telephone Encounter (Signed)
[facility]

## 2018-07-06 NOTE — Addendum Note (Signed)
Addended by: France Ravens I on: 07/06/2018 09:22 AM   Modules accepted: Orders

## 2018-07-07 MED ORDER — PRAMIPEXOLE DIHYDROCHLORIDE 0.5 MG PO TABS: 0.5000 mg | ORAL_TABLET | Freq: Every day | ORAL | 0 refills | Status: DC

## 2018-07-07 NOTE — Telephone Encounter (Signed)
I donot see where this is increased.  Please advise.

## 2018-07-07 NOTE — Telephone Encounter (Signed)
Pt states she is now taking .5mg  of the pramipexole (MIRAPEX)  She is asking if a revised [facility] [place] please call

## 2018-07-07 NOTE — Addendum Note (Signed)
Addended by: Kathrynn Ducking on: 07/07/2018 04:39 PM   Modules accepted: Orders

## 2018-07-07 NOTE — Telephone Encounter (Signed)
The patient has increased her Mirapex to 0.5 mg at bedtime, I will call in a prescription to accommodate this dose increase.  I called the patient, she indicated that she actually did not need a prescription at this time.  She has done better on the 0.5 mg nighttime dose.

## 2018-07-28 ENCOUNTER — Other Ambulatory Visit: Payer: Self-pay

## 2018-07-28 DIAGNOSIS — G35 Multiple sclerosis: Secondary | ICD-10-CM | POA: Diagnosis not present

## 2018-07-28 MED ORDER — PRAMIPEXOLE DIHYDROCHLORIDE 0.5 MG PO TABS: 0.5000 mg | ORAL_TABLET | Freq: Every day | ORAL | 0 refills | Status: DC

## 2018-08-02 DIAGNOSIS — L814 Other melanin hyperpigmentation: Secondary | ICD-10-CM | POA: Diagnosis not present

## 2018-08-02 DIAGNOSIS — D1801 Hemangioma of skin and subcutaneous tissue: Secondary | ICD-10-CM | POA: Diagnosis not present

## 2018-08-02 DIAGNOSIS — D225 Melanocytic nevi of trunk: Secondary | ICD-10-CM | POA: Diagnosis not present

## 2018-08-17 ENCOUNTER — Other Ambulatory Visit: Payer: Self-pay | Admitting: Nurse Practitioner

## 2018-08-17 ENCOUNTER — Other Ambulatory Visit (HOSPITAL_COMMUNITY)
Admission: RE | Admit: 2018-08-17 | Discharge: 2018-08-17 | Disposition: A | Discharge status: Home / Self Care | Payer: 59 | Source: Ambulatory Visit | Attending: Nurse Practitioner | Admitting: Nurse Practitioner

## 2018-08-17 DIAGNOSIS — Z01419 Encounter for gynecological examination (general) (routine) without abnormal findings: Secondary | ICD-10-CM | POA: Diagnosis present

## 2018-08-19 LAB — CYTOLOGY - PAP
DIAGNOSIS: NEGATIVE
HPV (WINDOPATH): NOT DETECTED

## 2018-08-23 ENCOUNTER — Ambulatory Visit: Payer: 59 | Admitting: Adult Health

## 2018-08-23 ENCOUNTER — Encounter: Payer: Self-pay | Admitting: Adult Health

## 2018-08-23 VITALS — BP 145/80 | HR 61 | Ht 70.0 in | Wt 263.8 lb

## 2018-08-23 DIAGNOSIS — M4722 Other spondylosis with radiculopathy, cervical region: Secondary | ICD-10-CM | POA: Diagnosis not present

## 2018-08-23 DIAGNOSIS — G35 Multiple sclerosis: Secondary | ICD-10-CM

## 2018-08-23 DIAGNOSIS — Z5181 Encounter for therapeutic drug level monitoring: Secondary | ICD-10-CM

## 2018-08-23 DIAGNOSIS — G4761 Periodic limb movement disorder: Secondary | ICD-10-CM | POA: Diagnosis not present

## 2018-08-23 MED ORDER — PRAMIPEXOLE DIHYDROCHLORIDE ER 0.375 MG PO TB24: 0.3750 mg | ORAL_TABLET | Freq: Every day | ORAL | 5 refills | Status: DC

## 2018-08-23 MED ORDER — PRAMIPEXOLE DIHYDROCHLORIDE 0.5 MG PO TABS: ORAL_TABLET | ORAL | 0 refills | Status: DC

## 2018-08-23 NOTE — Progress Notes (Signed)
JCV lab specimen placed in [facility] lock box at 1:30 pm.

## 2018-08-23 NOTE — Progress Notes (Signed)
PATIENT: [name] DOB: [birth year]  REASON FOR VISIT: follow up HISTORY FROM: patient  HISTORY OF PRESENT ILLNESS: Today 08/23/18:  [name] is a 58 year old female with a history of multiple sclerosis.  She returns today for follow-up.  She continues on Tysabri.  She denies any new numbness or weakness.  She states that she continues to have numbness in the right arm due to her neck.  She has seen Dr. Ronnald Ramp who recommended surgery but she has not pursued this yet.  She denies any changes with her vision.  No significant changes with her gait or balance.  She continues on Mirapex for PLMD.  She states that this typically works well however since she increased the dose to 0.5 mg she feels that she has been having some side effects.  Does not feel that she thinks is thinks as clearly as she did.  She also has noticed some restlessness during the day.  Particularly if she is driving or in the evenings when she comes home from work.  She does note some urinary frequency however she has been discussing this with her OB.  She returns today for evaluation.   HISTORY [name] is a 58 year old right-handed white female with a history of multiple sclerosis.  Since last seen, she primarily has had issues with her neck and low back.  She has been seen by Dr. Rolena Infante and by Dr. Ronnald Ramp from neurosurgery.  She has had surgery done on her low back within the last month for left-sided sciatica.  This has helped the pain.  She continues to have discomfort in the right arm and some tingling in the left hand associated with bilateral cervical radiculopathies.  She has potential for C5 and C6 nerve root impingement on the right and C7 nerve root impingement on the left by MRI of the cervical spine.  The patient does not believe that she has had any new symptoms associated with her multiple sclerosis.  She has not had any new numbness or weakness of the extremities, she denies issues controlling the bowels or the  bladder or any change in balance.  She has not noted any vision changes.  She is having chronic issues with insomnia, she has been told she has periodic limb movement disorder, she will twitch and move all night long, sometimes she will fall out of bed.  She is on CPAP for sleep apnea.  She returns to this office for an evaluation.  REVIEW OF SYSTEMS: Out of a complete 14 system review of symptoms, the patient complains only of the following symptoms, and all other reviewed systems are negative.  Environmental allergies, numbness, urgency, nausea, cough, restless leg, insomnia, apnea, snoring, joint pain, back pain, aching muscles, muscle cramps, neck pain, neck stiffness, decreased concentration, depression, nervous/anxious    ALLERGIES: Allergies  Allergen Reactions  . Penicillins Rash    HOME MEDICATIONS: Outpatient Medications Prior to Visit  Medication Sig Dispense Refill  . Armodafinil 150 MG tablet TAKE 1 TABLET BY MOUTH EVERY DAY 30 tablet 5  . atorvastatin (LIPITOR) 10 MG tablet 10 mg daily.    Marland Kitchen escitalopram (LEXAPRO) 10 MG tablet Take 10 mg by mouth daily.    Marland Kitchen ibuprofen (ADVIL,MOTRIN) 200 MG tablet Take 400 mg by mouth every 6 (six) hours as needed.    Marland Kitchen losartan (COZAAR) 50 MG tablet Take 1 tablet by mouth daily.    . natalizumab 300 mg in sodium chloride 0.9 % 100 mL Inject 300 mg  into the vein every 28 (twenty-eight) days.    . pantoprazole (PROTONIX) 20 MG tablet 20 mg daily.    . pramipexole (MIRAPEX) 0.5 MG tablet Take 1 tablet (0.5 mg total) by mouth at bedtime. 90 tablet 0  . prednisoLONE acetate (PRED FORTE) 1 % ophthalmic suspension As needed    . VENTOLIN HFA 108 (90 Base) MCG/ACT inhaler     . ALPRAZolam (XANAX) 0.5 MG tablet Take 2 tablets approximately 45 minutes prior to the MRI study, take a third tablet if needed. 3 tablet 0  . doxepin (SINEQUAN) 25 MG capsule Take 1 capsule (25 mg total) at bedtime by mouth. (Patient not taking: Reported on 01/28/2018) 90  capsule 0  . gabapentin (NEURONTIN) 300 MG capsule One capsule in the morning and noon, 2 at night 360 capsule 3   No facility-administered medications prior to visit.     PAST MEDICAL HISTORY: Past Medical History:  Diagnosis Date  . Asthma   . Back pain   . Cardiac/pericardial tamponade 06/11/2014   s/p pericardial window 06/16/2014 per Dr. Darcey Nora  . Depression   . Hypertension   . MS (multiple sclerosis) ([place])   . Muscle pain   . Obesity   . Periodic limb movement disorder (PLMD) 07/15/2016  . Pleural effusion, bilateral 06/30/2014  . Uveitis    Bilateral    PAST SURGICAL HISTORY: Past Surgical History:  Procedure Laterality Date  . microdisectomy  2019   lumbar spine  . PERICARDIAL TAP N/A 06/11/2014   Procedure: PERICARDIAL TAP;  Surgeon: Blane Ohara, MD;  Location: [facility] CATH LAB;  Service: Cardiovascular;  Laterality: N/A;  . SUBXYPHOID PERICARDIAL WINDOW N/A 06/16/2014   Procedure: SUBXYPHOID PERICARDIAL WINDOW;  Surgeon: Ivin Poot, MD;  Location: [place];  Service: Thoracic;  Laterality: N/A;  . TEE WITHOUT CARDIOVERSION N/A 06/16/2014   Procedure: TRANSESOPHAGEAL ECHOCARDIOGRAM (TEE);  Surgeon: Ivin Poot, MD;  Location: [place];  Service: Thoracic;  Laterality: N/A;  . uterine ablasion  2012    FAMILY HISTORY: Family History  Problem Relation Age of Onset  . Breast cancer Mother   . Hypertension Mother   . Diabetes Mother   . Heart attack Father   . Epilepsy Father   . Endocrine tumor Brother     SOCIAL HISTORY: Social History   Socioeconomic History  . Marital status: Single    Spouse name: Not on file  . Number of children: 0  . Years of education: MA  . Highest education level: Not on file  Occupational History  . Occupation: Soil scientist for [employer]  Social Needs  . Financial resource strain: Not on file  . Food insecurity:    Worry: Not on file    Inability: Not on file  . Transportation needs:    Medical: Not on file     Non-medical: Not on file  Tobacco Use  . Smoking status: Never Smoker  . Smokeless tobacco: Never Used  Substance and Sexual Activity  . Alcohol use: Yes    Alcohol/week: 0.0 standard drinks    Comment: occasional  . Drug use: No    Comment: marijuana  . Sexual activity: Not Currently  Lifestyle  . Physical activity:    Days per week: Not on file    Minutes per session: Not on file  . Stress: Not on file  Relationships  . Social connections:    Talks on phone: Not on file    Gets together: Not on file  Attends religious service: Not on file    Active member of club or organization: Not on file    Attends meetings of clubs or organizations: Not on file    Relationship status: Not on file  . Intimate partner violence:    Fear of current or ex partner: Not on file    Emotionally abused: Not on file    Physically abused: Not on file    Forced sexual activity: Not on file  Other Topics Concern  . Not on file  Social History Narrative   Patient lives at home with her wife [name])    Patient works full time.   Arboriculturist.   Right handed.   Caffeine two cups daily.      PHYSICAL EXAM  Vitals:   08/23/18 0843  BP: (!) 145/80  Pulse: 61  Weight: 263 lb 12.8 oz (119.7 kg)  Height: 5\' 10"  (1.778 m)   Body mass index is 37.85 kg/m.  Generalized: Well developed, in no acute distress   Neurological examination  Mentation: Alert oriented to time, place, history taking. Follows all commands speech and language fluent Cranial nerve II-XII: Pupils were equal round reactive to light. Extraocular movements were full, visual field were full on confrontational test. Facial sensation and strength were normal. Uvula tongue midline. Head turning and shoulder shrug  were normal and symmetric. Motor: The motor testing reveals 5 over 5 strength of all 4 extremities. Good symmetric motor tone is noted throughout.  Sensory: Sensory testing is intact to soft touch on all 4  extremities. No evidence of extinction is noted.  Coordination: Cerebellar testing reveals good finger-nose-finger and heel-to-shin bilaterally.  Gait and station: Gait is normal. Tandem gait is slightly unsteady.  Romberg is negative. No drift is seen.  Reflexes: Deep tendon reflexes are symmetric and normal bilaterally.   DIAGNOSTIC DATA (LABS, IMAGING, TESTING) - I reviewed patient records, labs, notes, testing and imaging myself where available.  Lab Results  Component Value Date   WBC 6.7 01/28/2018   HGB 13.0 01/28/2018   HCT 39.9 01/28/2018   MCV 88 01/28/2018   PLT 206 01/28/2018      Component Value Date/Time   NA 144 01/28/2018 0928   K 4.3 01/28/2018 0928   CL 104 01/28/2018 0928   CO2 22 01/28/2018 0928   GLUCOSE 83 01/28/2018 0928   GLUCOSE 84 07/10/2014 0944   BUN 16 01/28/2018 0928   CREATININE 0.65 01/28/2018 0928   CREATININE 0.76 06/27/2014 0915   CALCIUM 9.7 01/28/2018 0928   PROT 7.2 01/28/2018 0928   ALBUMIN 4.5 01/28/2018 0928   AST 17 01/28/2018 0928   ALT 18 01/28/2018 0928   ALKPHOS 111 01/28/2018 0928   BILITOT 0.3 01/28/2018 0928   GFRNONAA 101 01/28/2018 0928   GFRAA 116 01/28/2018 0928      ASSESSMENT AND PLAN 58 y.o. year old female  has a past medical history of Asthma, Back pain, Cardiac/pericardial tamponade (06/11/2014), Depression, Hypertension, MS (multiple sclerosis) ([place]), Muscle pain, Obesity, Periodic limb movement disorder (PLMD) (07/15/2016), Pleural effusion, bilateral (06/30/2014), and Uveitis. here with:  1.  Multiple sclerosis 2.  Periodic limb movement disorder  The patient will continue on Tysabri.  I will check blood work today.  She had MRI of the brain and cervical spine in September 2019 that was relatively stable.  She is having some restlessness during the day that she relates to her PLMD.  I will switch her to Mirapex extended release 0.375 mg.  The patient did not want to try an increased dose.  I did advise that if  she has breakthrough symptoms she can take Mirapex immediate release 0.25 mg.  Patient verbalized understanding.  If this is not beneficial she will let us know.  She is encouraged to follow-up with Dr. Ronnald Ramp in regards to her neck pain and numbness down the left arm.  She will follow-up in 6 months or sooner if needed.     Ward Givens, MSN, NP-C 08/23/2018, 8:43 AM [facility] [address] [place], [place] [address] [phone]

## 2018-08-23 NOTE — Patient Instructions (Addendum)
Your Plan:  Continue Tysabri Blood work today Mirapex ER ordered 0.375 mg daily. Can take mirapex IR 1/2 tab at bedtime if needed for breakthrough symptoms. If your symptoms worsen or you develop new symptoms please let us know.    Thank you for coming to see [place] at [facility]. I hope we have been able to provide you high quality care today.  You may receive a patient satisfaction survey over the next few weeks. We would appreciate your feedback and comments so that we may continue to improve ourselves and the health of our patients.

## 2018-08-24 ENCOUNTER — Encounter: Payer: Self-pay | Admitting: *Deleted

## 2018-08-24 LAB — COMPREHENSIVE METABOLIC PANEL
ALBUMIN: 4.3 g/dL (ref 3.5–5.5)
ALT: 16 IU/L (ref 0–32)
AST: 19 IU/L (ref 0–40)
Albumin/Globulin Ratio: 1.7 (ref 1.2–2.2)
Alkaline Phosphatase: 99 IU/L (ref 39–117)
BUN / CREAT RATIO: 23 (ref 9–23)
BUN: 16 mg/dL (ref 6–24)
Bilirubin Total: 0.3 mg/dL (ref 0.0–1.2)
CO2: 24 mmol/L (ref 20–29)
Calcium: 9.4 mg/dL (ref 8.7–10.2)
Chloride: 104 mmol/L (ref 96–106)
Creatinine, Ser: 0.7 mg/dL (ref 0.57–1.00)
GFR calc non Af Amer: 98 mL/min/{1.73_m2} (ref >59)
GFR, EST AFRICAN AMERICAN: 113 mL/min/{1.73_m2} (ref >59)
GLUCOSE: 96 mg/dL (ref 65–99)
Globulin, Total: 2.5 g/dL (ref 1.5–4.5)
Potassium: 4.6 mmol/L (ref 3.5–5.2)
Sodium: 141 mmol/L (ref 134–144)
Total Protein: 6.8 g/dL (ref 6.0–8.5)

## 2018-08-24 LAB — CBC WITH DIFFERENTIAL/PLATELET
Basophils Absolute: 0.1 10*3/uL (ref 0.0–0.2)
Basos: 1 %
EOS (ABSOLUTE): 0.3 10*3/uL (ref 0.0–0.4)
Eos: 4 %
HEMOGLOBIN: 12.4 g/dL (ref 11.1–15.9)
Hematocrit: 37.2 % (ref 34.0–46.6)
IMMATURE GRANS (ABS): 0 10*3/uL (ref 0.0–0.1)
Immature Granulocytes: 0 %
Lymphocytes Absolute: 3.3 10*3/uL — ABNORMAL HIGH (ref 0.7–3.1)
Lymphs: 45 %
MCH: 29 pg (ref 26.6–33.0)
MCHC: 33.3 g/dL (ref 31.5–35.7)
MCV: 87 fL (ref 79–97)
Monocytes Absolute: 0.7 10*3/uL (ref 0.1–0.9)
Monocytes: 10 %
Neutrophils Absolute: 3 10*3/uL (ref 1.4–7.0)
Neutrophils: 40 %
PLATELETS: 193 10*3/uL (ref 150–450)
RBC: 4.28 x10E6/uL (ref 3.77–5.28)
RDW: 13.4 % (ref 11.7–15.4)
WBC: 7.4 10*3/uL (ref 3.4–10.8)

## 2018-09-08 ENCOUNTER — Other Ambulatory Visit: Payer: Self-pay | Admitting: Neurology

## 2018-09-14 ENCOUNTER — Telehealth: Payer: Self-pay | Admitting: Adult Health

## 2018-09-14 NOTE — Telephone Encounter (Signed)
JCV negative 0.17

## 2018-09-15 NOTE — Telephone Encounter (Signed)
Spoke to pt and relayed the JCV results to pt.  She verbalized understanding.

## 2018-09-15 NOTE — Progress Notes (Signed)
JCV negative 0.17, relayed result to pt. 09-15-2018.

## 2018-10-28 ENCOUNTER — Other Ambulatory Visit: Payer: Self-pay

## 2018-10-28 MED ORDER — PRAMIPEXOLE DIHYDROCHLORIDE ER 0.375 MG PO TB24: 0.3750 mg | ORAL_TABLET | Freq: Every day | ORAL | 0 refills | Status: DC

## 2018-10-28 MED ORDER — ARMODAFINIL 150 MG PO TABS: 150.0000 mg | ORAL_TABLET | Freq: Every day | ORAL | 1 refills | Status: DC

## 2018-10-28 NOTE — Telephone Encounter (Signed)
Pt is requesting Armodafinil rx be switched from [facility] and sent to [facility].   drug registry verified. Last refill was written on 09/08/18 # 90 for a 90 day supply provided by Dr. Jannifer Franklin and [facility]illed by [facility].  Will fwd to Dr. Jannifer Franklin to review.

## 2018-10-28 NOTE — Addendum Note (Signed)
Addended by: Kathrynn Ducking on: 10/28/2018 04:42 PM   Modules accepted: Orders

## 2018-11-08 ENCOUNTER — Telehealth: Payer: Self-pay | Admitting: Adult Health

## 2018-11-08 NOTE — Telephone Encounter (Signed)
pt has called for the intrafusion suite, call transferred °

## 2018-11-20 ENCOUNTER — Other Ambulatory Visit: Payer: Self-pay | Admitting: Neurology

## 2018-12-14 ENCOUNTER — Telehealth: Payer: Self-pay

## 2018-12-14 NOTE — Telephone Encounter (Signed)
Received a fax from [facility] stating pt has been authorized from tysabri 12/13/18 through 07/15/2019. Pt enrolment # is [id number]  Site auth # is [id number].

## 2018-12-16 ENCOUNTER — Other Ambulatory Visit: Payer: Self-pay | Admitting: Neurology

## 2018-12-23 ENCOUNTER — Other Ambulatory Visit: Payer: Self-pay | Admitting: Neurology

## 2018-12-27 ENCOUNTER — Telehealth: Payer: Self-pay

## 2018-12-27 NOTE — Telephone Encounter (Signed)
Received fax from [facility] requesting clarification on Primipexole dosage.  Fax wanted to clarify the strength the pt should be receiving. Dr. Jannifer Franklin reviewed and confirmed pt dosage should be Primipexole 0.25 mg only.  I [facility] # [phone]. Confirmation received.

## 2019-02-17 ENCOUNTER — Other Ambulatory Visit: Payer: Self-pay | Admitting: Neurology

## 2019-02-24 ENCOUNTER — Telehealth: Payer: Self-pay

## 2019-02-24 ENCOUNTER — Ambulatory Visit: Payer: 59 | Admitting: Adult Health

## 2019-02-24 NOTE — Telephone Encounter (Signed)
Patient was a no call/no show for their appointment today.   

## 2019-02-25 ENCOUNTER — Other Ambulatory Visit: Payer: Self-pay | Admitting: Adult Health

## 2019-03-03 ENCOUNTER — Ambulatory Visit: Payer: 59 | Admitting: Adult Health

## 2019-04-19 ENCOUNTER — Other Ambulatory Visit: Payer: Self-pay

## 2019-04-19 ENCOUNTER — Encounter: Payer: Self-pay | Admitting: Adult Health

## 2019-04-19 ENCOUNTER — Ambulatory Visit (INDEPENDENT_AMBULATORY_CARE_PROVIDER_SITE_OTHER): Payer: 59 | Admitting: Adult Health

## 2019-04-19 VITALS — BP 136/88 | HR 58 | Temp 97.8°F | Ht 70.0 in | Wt 266.8 lb

## 2019-04-19 DIAGNOSIS — G4761 Periodic limb movement disorder: Secondary | ICD-10-CM | POA: Diagnosis not present

## 2019-04-19 DIAGNOSIS — G35 Multiple sclerosis: Secondary | ICD-10-CM

## 2019-04-19 NOTE — Progress Notes (Addendum)
PATIENT: [name] DOB: [birth year]  REASON FOR VISIT: follow up HISTORY FROM: patient  HISTORY OF PRESENT ILLNESS: Today 04/19/19:  [name] is a 58 year old female with a history of relapsed remitting multiple sclerosis.  She returns today for follow-up.  She continues on Tysabri.  She reports that she continues to have some numbness in the arms however she feels that this is related to her neck.  She has not followed up with Dr. Ronnald Ramp since he recommended surgery.  She does plan to schedule an appointment.  She denies any changes with her gait or balance.  No changes with the bowels or bladder.  New change in her vision with the exception that she feels that she may be having a flareup of optic neuritis.  She states that this happens from time to time and her ophthalmologist has given her some drops to use.  She reports that the change in Mirapex has been beneficial.  She states that she is sleeping well and is not affecting her cognition.  She returns today for an evaluation.  HISTORY 08/23/18:  [name] is a 58 year old female with a history of multiple sclerosis.  She returns today for follow-up.  She continues on Tysabri.  She denies any new numbness or weakness.  She states that she continues to have numbness in the right arm due to her neck.  She has seen Dr. Ronnald Ramp who recommended surgery but she has not pursued this yet.  She denies any changes with her vision.  No significant changes with her gait or balance.  She continues on Mirapex for PLMD.  She states that this typically works well however since she increased the dose to 0.5 mg she feels that she has been having some side effects.  Does not feel that she thinks is thinks as clearly as she did.  She also has noticed some restlessness during the day.  Particularly if she is driving or in the evenings when she comes home from work.  She does note some urinary frequency however she has been discussing this with her OB.  She returns  today for evaluation.   REVIEW OF SYSTEMS: Out of a complete 14 system review of symptoms, the patient complains only of the following symptoms, and all other reviewed systems are negative.  See HPI  ALLERGIES: Allergies  Allergen Reactions   Penicillins Rash    HOME MEDICATIONS: Outpatient Medications Prior to Visit  Medication Sig Dispense Refill   Armodafinil 150 MG tablet Take 1 tablet (150 mg total) by mouth daily. 90 tablet 1   atorvastatin (LIPITOR) 10 MG tablet 10 mg daily.     escitalopram (LEXAPRO) 10 MG tablet Take 10 mg by mouth daily.     ibuprofen (ADVIL,MOTRIN) 200 MG tablet Take 400 mg by mouth every 6 (six) hours as needed.     losartan (COZAAR) 50 MG tablet Take 1 tablet by mouth daily.     natalizumab 300 mg in sodium chloride 0.9 % 100 mL Inject 300 mg into the vein every 28 (twenty-eight) days.     pantoprazole (PROTONIX) 20 MG tablet 20 mg daily.     pramipexole (MIRAPEX) 0.25 MG tablet TAKE 1 TABLET AT BEDTIME 90 tablet 0   Pramipexole Dihydrochloride 0.375 MG TB24 TAKE 1 TABLET AT BEDTIME 90 tablet 3   prednisoLONE acetate (PRED FORTE) 1 % ophthalmic suspension As needed     VENTOLIN HFA 108 (90 Base) MCG/ACT inhaler      No facility-administered  medications prior to visit.     PAST MEDICAL HISTORY: Past Medical History:  Diagnosis Date   Asthma    Back pain    Cardiac/pericardial tamponade 06/11/2014   s/p pericardial window 06/16/2014 per Dr. Darcey Nora   Depression    Hypertension    MS (multiple sclerosis) ([facility])    Muscle pain    Obesity    Periodic limb movement disorder (PLMD) 07/15/2016   Pleural effusion, bilateral 06/30/2014   Uveitis    Bilateral    PAST SURGICAL HISTORY: Past Surgical History:  Procedure Laterality Date   microdisectomy  2019   lumbar spine   PERICARDIAL TAP N/A 06/11/2014   Procedure: PERICARDIAL TAP;  Surgeon: Blane Ohara, MD;  Location: [facility] CATH LAB;  Service: Cardiovascular;   Laterality: N/A;   SUBXYPHOID PERICARDIAL WINDOW N/A 06/16/2014   Procedure: SUBXYPHOID PERICARDIAL WINDOW;  Surgeon: Ivin Poot, MD;  Location: [place];  Service: Thoracic;  Laterality: N/A;   TEE WITHOUT CARDIOVERSION N/A 06/16/2014   Procedure: TRANSESOPHAGEAL ECHOCARDIOGRAM (TEE);  Surgeon: Ivin Poot, MD;  Location: [facility] OR;  Service: Thoracic;  Laterality: N/A;   uterine ablasion  2012    FAMILY HISTORY: Family History  Problem Relation Age of Onset   Breast cancer Mother    Hypertension Mother    Diabetes Mother    Heart attack Father    Epilepsy Father    Endocrine tumor Brother     SOCIAL HISTORY: Social History   Socioeconomic History   Marital status: Single    Spouse name: Not on file   Number of children: 0   Years of education: MA   Highest education level: Not on file  Occupational History   Occupation: Soil scientist for [employer] resource strain: Not on file   Food insecurity    Worry: Not on file    Inability: Not on file   Transportation needs    Medical: Not on file    Non-medical: Not on file  Tobacco Use   Smoking status: Never Smoker   Smokeless tobacco: Never Used  Substance and Sexual Activity   Alcohol use: Yes    Alcohol/week: 0.0 standard drinks    Comment: occasional   Drug use: No    Comment: marijuana   Sexual activity: Not Currently  Lifestyle   Physical activity    Days per week: Not on file    Minutes per session: Not on file   Stress: Not on file  Relationships   Social connections    Talks on phone: Not on file    Gets together: Not on file    Attends religious service: Not on file    Active member of club or organization: Not on file    Attends meetings of clubs or organizations: Not on file    Relationship status: Not on file   Intimate partner violence    Fear of current or ex partner: Not on file    Emotionally abused: Not on file    Physically abused: Not  on file    Forced sexual activity: Not on file  Other Topics Concern   Not on file  Social History Narrative   Patient lives at home with her wife [name])    Patient works full time.   Arboriculturist.   Right handed.   Caffeine two cups daily.      PHYSICAL EXAM  Vitals:   04/19/19 0908  BP: 136/88  Pulse: Marland Kitchen)  58  Temp: 97.8 F (36.6 C)  Weight: 266 lb 12.8 oz (121 kg)  Height: 5\' 10"  (1.778 m)   Body mass index is 38.28 kg/m.  Generalized: Well developed, in no acute distress   Neurological examination  Mentation: Alert oriented to time, place, history taking. Follows all commands speech and language fluent Cranial nerve II-XII: Pupils were equal round reactive to light. Extraocular movements were full, visual field were full on confrontational test.  Head turning and shoulder shrug  were normal and symmetric. Motor: The motor testing reveals 5 over 5 strength of all 4 extremities. Good symmetric motor tone is noted throughout.  Sensory: Sensory testing is intact to soft touch on all 4 extremities. No evidence of extinction is noted.  Coordination: Cerebellar testing reveals good finger-nose-finger and heel-to-shin bilaterally.  Gait and station: Gait is normal.  Reflexes: Deep tendon reflexes are symmetric and normal bilaterally.   DIAGNOSTIC DATA (LABS, IMAGING, TESTING) - I reviewed patient records, labs, notes, testing and imaging myself where available.  Lab Results  Component Value Date   WBC 7.4 08/23/2018   HGB 12.4 08/23/2018   HCT 37.2 08/23/2018   MCV 87 08/23/2018   PLT 193 08/23/2018      Component Value Date/Time   NA 141 08/23/2018 0917   K 4.6 08/23/2018 0917   CL 104 08/23/2018 0917   CO2 24 08/23/2018 0917   GLUCOSE 96 08/23/2018 0917   GLUCOSE 84 07/10/2014 0944   BUN 16 08/23/2018 0917   CREATININE 0.70 08/23/2018 0917   CREATININE 0.76 06/27/2014 0915   CALCIUM 9.4 08/23/2018 0917   PROT 6.8 08/23/2018 0917   ALBUMIN 4.3  08/23/2018 0917   AST 19 08/23/2018 0917   ALT 16 08/23/2018 0917   ALKPHOS 99 08/23/2018 0917   BILITOT 0.3 08/23/2018 0917   GFRNONAA 98 08/23/2018 0917   GFRAA 113 08/23/2018 0917   No results found for: CHOL, HDL, LDLCALC, LDLDIRECT, TRIG, CHOLHDL No results found for: HGBA1C No results found for: VITAMINB12 No results found for: TSH    ASSESSMENT AND PLAN 58 y.o. year old female  has a past medical history of Asthma, Back pain, Cardiac/pericardial tamponade (06/11/2014), Depression, Hypertension, MS (multiple sclerosis) ([place]), Muscle pain, Obesity, Periodic limb movement disorder (PLMD) (07/15/2016), Pleural effusion, bilateral (06/30/2014), and Uveitis. here with:  1.  Multiple sclerosis 2.  Periodic limb movement disorder  Overall the patient is doing well.  She will continue on Tysabri.  I will check blood work today.  We will repeat an MRI of the brain and cervical spine to look for progression of MS.  She will continue on Mirapex for PLMD.  She is advised that if her symptoms worsen or she develops new symptoms she should let us know.  She will follow-up in 6 months or sooner if needed.     Ward Givens, MSN, NP-C 04/19/2019, 8:58 AM [facility] [address], [place], [place] [address] [phone]

## 2019-04-19 NOTE — Progress Notes (Signed)
I have read the note, and I agree with the clinical assessment and plan.  Mccormick Macon K Yancarlos Berthold   

## 2019-04-19 NOTE — Patient Instructions (Addendum)
Your Plan:  Continue Tysabri  Continue Mirapex Blood work today MRI brain and Cervical spine If your symptoms worsen or you develop new symptoms please let us know.    Thank you for coming to see [place] at [facility]. I hope we have been able to provide you high quality care today.  You may receive a patient satisfaction survey over the next few weeks. We would appreciate your feedback and comments so that we may continue to improve ourselves and the health of our patients.

## 2019-04-20 LAB — CBC WITH DIFFERENTIAL/PLATELET
Basophils Absolute: 0.1 10*3/uL (ref 0.0–0.2)
Basos: 1 %
EOS (ABSOLUTE): 0.2 10*3/uL (ref 0.0–0.4)
Eos: 3 %
Hematocrit: 39.3 % (ref 34.0–46.6)
Hemoglobin: 13.3 g/dL (ref 11.1–15.9)
Immature Grans (Abs): 0 10*3/uL (ref 0.0–0.1)
Immature Granulocytes: 0 %
Lymphocytes Absolute: 3.1 10*3/uL (ref 0.7–3.1)
Lymphs: 43 %
MCH: 29.4 pg (ref 26.6–33.0)
MCHC: 33.8 g/dL (ref 31.5–35.7)
MCV: 87 fL (ref 79–97)
Monocytes Absolute: 0.7 10*3/uL (ref 0.1–0.9)
Monocytes: 9 %
Neutrophils Absolute: 3.3 10*3/uL (ref 1.4–7.0)
Neutrophils: 44 %
Platelets: 197 10*3/uL (ref 150–450)
RBC: 4.53 x10E6/uL (ref 3.77–5.28)
RDW: 13.7 % (ref 11.7–15.4)
WBC: 7.4 10*3/uL (ref 3.4–10.8)

## 2019-04-20 LAB — COMPREHENSIVE METABOLIC PANEL
ALT: 17 IU/L (ref 0–32)
AST: 18 IU/L (ref 0–40)
Albumin/Globulin Ratio: 1.6 (ref 1.2–2.2)
Albumin: 4.4 g/dL (ref 3.8–4.9)
Alkaline Phosphatase: 111 IU/L (ref 39–117)
BUN/Creatinine Ratio: 13 (ref 9–23)
BUN: 11 mg/dL (ref 6–24)
Bilirubin Total: 0.4 mg/dL (ref 0.0–1.2)
CO2: 24 mmol/L (ref 20–29)
Calcium: 9.4 mg/dL (ref 8.7–10.2)
Chloride: 101 mmol/L (ref 96–106)
Creatinine, Ser: 0.87 mg/dL (ref 0.57–1.00)
GFR calc Af Amer: 86 mL/min/{1.73_m2} (ref >59)
GFR calc non Af Amer: 75 mL/min/{1.73_m2} (ref >59)
Globulin, Total: 2.7 g/dL (ref 1.5–4.5)
Glucose: 101 mg/dL — ABNORMAL HIGH (ref 65–99)
Potassium: 5.1 mmol/L (ref 3.5–5.2)
Sodium: 136 mmol/L (ref 134–144)
Total Protein: 7.1 g/dL (ref 6.0–8.5)

## 2019-04-21 ENCOUNTER — Telehealth: Payer: Self-pay | Admitting: Adult Health

## 2019-04-21 ENCOUNTER — Encounter: Payer: Self-pay | Admitting: *Deleted

## 2019-04-21 NOTE — Telephone Encounter (Signed)
LVM for pt to call back about scheduling [name]: [id number] (exp. 04/21/19 to 10/18/19)

## 2019-04-28 ENCOUNTER — Telehealth: Payer: Self-pay | Admitting: *Deleted

## 2019-04-28 NOTE — Telephone Encounter (Signed)
JCV negative and index 0.12    04-26-19. [facility].

## 2019-04-28 NOTE — Progress Notes (Signed)
JCV negative index 0.12. 04-16-19 sy

## 2019-04-28 NOTE — Telephone Encounter (Signed)
JCV negative and index 0.12.

## 2019-06-07 ENCOUNTER — Telehealth: Payer: Self-pay | Admitting: Adult Health

## 2019-06-07 NOTE — Telephone Encounter (Signed)
[name],   Patient came in today for her infusion and states that she never got a call to schedule her MRI. I advised patient as it states in her chart that we called her 04/21/19 and LVM asking for her to call us back. Would you mind calling patient again to schedule this?

## 2019-06-08 NOTE — Telephone Encounter (Signed)
Yes ok to use xanax

## 2019-06-08 NOTE — Telephone Encounter (Signed)
I spoke to the patient she is scheduled at [facility] for 06/28/19.  She also has a problem laying flat for a long period of time and would like something to help her out. She is aware if she takes it she will have to have a driver.

## 2019-06-08 NOTE — Addendum Note (Signed)
Addended by: Brandon Melnick on: 06/08/2019 05:08 PM   Modules accepted: Orders

## 2019-06-09 MED ORDER — ALPRAZOLAM 0.5 MG PO TABS: ORAL_TABLET | ORAL | 0 refills | Status: DC

## 2019-06-09 NOTE — Addendum Note (Signed)
Addended by: Trudie Buckler on: 06/09/2019 10:30 AM   Modules accepted: Orders

## 2019-06-23 ENCOUNTER — Telehealth: Payer: Self-pay

## 2019-06-23 NOTE — Telephone Encounter (Signed)
[facility] reauth form has been approved for the pt's Tysabri from 06/23/2019-01/14/2020.

## 2019-06-24 ENCOUNTER — Other Ambulatory Visit: Payer: Self-pay | Admitting: Internal Medicine

## 2019-06-24 DIAGNOSIS — Z1231 Encounter for screening mammogram for malignant neoplasm of breast: Secondary | ICD-10-CM

## 2019-06-28 ENCOUNTER — Ambulatory Visit (INDEPENDENT_AMBULATORY_CARE_PROVIDER_SITE_OTHER): Payer: 59

## 2019-06-28 ENCOUNTER — Other Ambulatory Visit: Payer: Self-pay

## 2019-06-28 DIAGNOSIS — G35 Multiple sclerosis: Secondary | ICD-10-CM | POA: Diagnosis not present

## 2019-06-28 MED ORDER — GADOBENATE DIMEGLUMINE 529 MG/ML IV SOLN
20.0000 mL | Freq: Once | INTRAVENOUS | Status: AC | PRN
  Administered 2019-06-28: 10:00:00 20 mL via INTRAVENOUS

## 2019-06-30 ENCOUNTER — Telehealth: Payer: Self-pay

## 2019-06-30 NOTE — Telephone Encounter (Signed)
Called pt per NP Ward Givens. No answer. Left voice mail for her to call us back.    MRI results per NP Ward Givens... "Notes recorded by Ward Givens, NP on 06/30/2019 at 8:12 AM EST  Stable. No change from previous MRI"

## 2019-06-30 NOTE — Telephone Encounter (Signed)
Pt called back. I was able to go over her MRI results w and w/o Contrast. (review previous phone note.)  Pt demonstrated understanding.

## 2019-07-03 ENCOUNTER — Other Ambulatory Visit: Payer: Self-pay | Admitting: Neurology

## 2019-08-15 ENCOUNTER — Other Ambulatory Visit: Payer: Self-pay | Admitting: Neurology

## 2019-08-18 ENCOUNTER — Ambulatory Visit
Admission: RE | Admit: 2019-08-18 | Discharge: 2019-08-18 | Disposition: A | Discharge status: Home / Self Care | Payer: 59 | Source: Ambulatory Visit | Attending: Internal Medicine | Admitting: Internal Medicine

## 2019-08-18 ENCOUNTER — Other Ambulatory Visit: Payer: Self-pay

## 2019-08-18 DIAGNOSIS — Z1231 Encounter for screening mammogram for malignant neoplasm of breast: Secondary | ICD-10-CM

## 2019-08-22 ENCOUNTER — Telehealth: Payer: Self-pay

## 2019-08-22 MED ORDER — ARMODAFINIL 150 MG PO TABS: 150.0000 mg | ORAL_TABLET | Freq: Every day | ORAL | 1 refills | Status: DC

## 2019-08-22 NOTE — Telephone Encounter (Signed)
The prescription for Nuvigil will be refilled.

## 2019-08-22 NOTE — Telephone Encounter (Signed)
1) Medication(s) Requested (by name): Armodafinil 150 MG tablet   2) [facility] [place]

## 2019-08-22 NOTE — Telephone Encounter (Signed)
[facility] Database Verified LR: 11-08-2018 Qty: 90 Pending appointment: 10-17-2019 Jinny Blossom)

## 2019-10-17 ENCOUNTER — Encounter: Payer: Self-pay | Admitting: Adult Health

## 2019-10-17 ENCOUNTER — Other Ambulatory Visit: Payer: Self-pay

## 2019-10-17 ENCOUNTER — Ambulatory Visit (INDEPENDENT_AMBULATORY_CARE_PROVIDER_SITE_OTHER): Payer: PRIVATE HEALTH INSURANCE | Admitting: Adult Health

## 2019-10-17 VITALS — BP 145/80 | HR 64 | Temp 97.4°F | Ht 70.0 in | Wt 280.0 lb

## 2019-10-17 DIAGNOSIS — Z5181 Encounter for therapeutic drug level monitoring: Secondary | ICD-10-CM

## 2019-10-17 DIAGNOSIS — G35 Multiple sclerosis: Secondary | ICD-10-CM | POA: Diagnosis not present

## 2019-10-17 DIAGNOSIS — G4761 Periodic limb movement disorder: Secondary | ICD-10-CM | POA: Diagnosis not present

## 2019-10-17 NOTE — Progress Notes (Signed)
PATIENT: [name] DOB: 1960-12-18  REASON FOR VISIT: follow up HISTORY FROM: patient  HISTORY OF PRESENT ILLNESS: Today 10/17/19:  [name] is a 59 year old female with a history of relapsed remitting multiple sclerosis.  She returns today for follow-up.  She continues on Tysabri.  Denies any new symptoms.  Denies any changes with her vision.  No changes with the bowels or bladder with the exception that she has diarrhea more often now.  No change in her diet.  She reports that she had a fall in December.  Reports that she slipped on her back deck.  Reports that she injured her left knee but this is slowly improving.  Denies any new weakness.  Continues to have numbness down the left arm.  Has not followed up with Dr. Ronnald Ramp.  HISTORY 04/19/19:  [name] is a 59 year old female with a history of relapsed remitting multiple sclerosis.  She returns today for follow-up.  She continues on Tysabri.  She reports that she continues to have some numbness in the arms however she feels that this is related to her neck.  She has not followed up with Dr. Ronnald Ramp since he recommended surgery.  She does plan to schedule an appointment.  She denies any changes with her gait or balance.  No changes with the bowels or bladder.  New change in her vision with the exception that she feels that she may be having a flareup of optic neuritis.  She states that this happens from time to time and her ophthalmologist has given her some drops to use.  She reports that the change in Mirapex has been beneficial.  She states that she is sleeping well and is not affecting her cognition.  She returns today for an evaluation.  REVIEW OF SYSTEMS: Out of a complete 14 system review of symptoms, the patient complains only of the following symptoms, and all other reviewed systems are negative.  See HPI  ALLERGIES: Allergies  Allergen Reactions  . Penicillins Rash    HOME MEDICATIONS: Outpatient Medications Prior to  Visit  Medication Sig Dispense Refill  . Armodafinil 150 MG tablet Take 1 tablet (150 mg total) by mouth daily. 90 tablet 1  . atorvastatin (LIPITOR) 10 MG tablet 10 mg daily.    Marland Kitchen escitalopram (LEXAPRO) 10 MG tablet Take 10 mg by mouth daily.    Marland Kitchen ibuprofen (ADVIL,MOTRIN) 200 MG tablet Take 400 mg by mouth every 6 (six) hours as needed.    Marland Kitchen losartan (COZAAR) 50 MG tablet Take 1 tablet by mouth daily.    . natalizumab 300 mg in sodium chloride 0.9 % 100 mL Inject 300 mg into the vein every 28 (twenty-eight) days.    . pantoprazole (PROTONIX) 20 MG tablet 20 mg daily.    . pramipexole (MIRAPEX) 0.25 MG tablet TAKE 1 TABLET AT BEDTIME 90 tablet 3  . Pramipexole Dihydrochloride 0.375 MG TB24 TAKE 1 TABLET AT BEDTIME 90 tablet 3  . prednisoLONE acetate (PRED FORTE) 1 % ophthalmic suspension As needed    . VENTOLIN HFA 108 (90 Base) MCG/ACT inhaler     . ALPRAZolam (XANAX) 0.5 MG tablet Take 1 tablet 1 hour prior to MRI and another if needed when arrives at site. (Patient not taking: Reported on 10/17/2019) 2 tablet 0   No facility-administered medications prior to visit.    PAST MEDICAL HISTORY: Past Medical History:  Diagnosis Date  . Asthma   . Back pain   . Cardiac/pericardial tamponade 06/11/2014  s/p pericardial window 06/16/2014 per Dr. Darcey Nora  . Depression   . Hypertension   . MS (multiple sclerosis) ([place])   . Muscle pain   . Obesity   . Periodic limb movement disorder (PLMD) 07/15/2016  . Pleural effusion, bilateral 06/30/2014  . Uveitis    Bilateral    PAST SURGICAL HISTORY: Past Surgical History:  Procedure Laterality Date  . microdisectomy  2019   lumbar spine  . PERICARDIAL TAP N/A 06/11/2014   Procedure: PERICARDIAL TAP;  Surgeon: Blane Ohara, MD;  Location: [facility] CATH LAB;  Service: Cardiovascular;  Laterality: N/A;  . SUBXYPHOID PERICARDIAL WINDOW N/A 06/16/2014   Procedure: SUBXYPHOID PERICARDIAL WINDOW;  Surgeon: Ivin Poot, MD;  Location: [facility];  Service:  Thoracic;  Laterality: N/A;  . TEE WITHOUT CARDIOVERSION N/A 06/16/2014   Procedure: TRANSESOPHAGEAL ECHOCARDIOGRAM (TEE);  Surgeon: Ivin Poot, MD;  Location: [facility];  Service: Thoracic;  Laterality: N/A;  . uterine ablasion  2012    FAMILY HISTORY: Family History  Problem Relation Age of Onset  . Breast cancer Mother   . Hypertension Mother   . Diabetes Mother   . Heart attack Father   . Epilepsy Father   . Endocrine tumor Brother     SOCIAL HISTORY: Social History   Socioeconomic History  . Marital status: Single    Spouse name: Not on file  . Number of children: 0  . Years of education: MA  . Highest education level: Not on file  Occupational History  . Occupation: Soil scientist for [employer]  Tobacco Use  . Smoking status: Never Smoker  . Smokeless tobacco: Never Used  Substance and Sexual Activity  . Alcohol use: Yes    Alcohol/week: 0.0 standard drinks    Comment: occasional  . Drug use: No    Comment: marijuana  . Sexual activity: Not Currently  Other Topics Concern  . Not on file  Social History Narrative   Patient lives at home with her wife [name])    Patient works full time.   Arboriculturist.   Right handed.   Caffeine two cups daily.   Social Determinants of Health   Financial Resource Strain:   . Difficulty of Paying Living Expenses: Not on file  Food Insecurity:   . Worried About Charity fundraiser in the Last Year: Not on file  . Ran Out of Food in the Last Year: Not on file  Transportation Needs:   . Lack of Transportation (Medical): Not on file  . Lack of Transportation (Non-Medical): Not on file  Physical Activity:   . Days of Exercise per Week: Not on file  . Minutes of Exercise per Session: Not on file  Stress:   . Feeling of Stress : Not on file  Social Connections:   . Frequency of Communication with Friends and Family: Not on file  . Frequency of Social Gatherings with Friends and Family: Not on file  . Attends Religious  Services: Not on file  . Active Member of Clubs or Organizations: Not on file  . Attends Archivist Meetings: Not on file  . Marital Status: Not on file  Intimate Partner Violence:   . Fear of Current or Ex-Partner: Not on file  . Emotionally Abused: Not on file  . Physically Abused: Not on file  . Sexually Abused: Not on file      PHYSICAL EXAM  Vitals:   10/17/19 0920  BP: (!) 145/80  Pulse: 64  Temp: Marland Kitchen)  97.4 F (36.3 C)  SpO2: 99%  Weight: 280 lb (127 kg)  Height: 5\' 10"  (1.778 m)   Body mass index is 40.18 kg/m.  Generalized: Well developed, in no acute distress   Neurological examination  Mentation: Alert oriented to time, place, history taking. Follows all commands speech and language fluent Cranial nerve II-XII: Pupils were equal round reactive to light. Extraocular movements were full, visual field were full on confrontational test.  Head turning and shoulder shrug  were normal and symmetric. Motor: The motor testing reveals 5 over 5 strength of all 4 extremities. Good symmetric motor tone is noted throughout.  Sensory: Sensory testing is intact to soft touch on all 4 extremities. No evidence of extinction is noted.  Coordination: Cerebellar testing reveals good finger-nose-finger and heel-to-shin bilaterally.  Gait and station: Gait is normal. Tandem gait is normal. Romberg is negative. No drift is seen.  Reflexes: Deep tendon reflexes are symmetric and normal bilaterally.   DIAGNOSTIC DATA (LABS, IMAGING, TESTING) - I reviewed patient records, labs, notes, testing and imaging myself where available.  Lab Results  Component Value Date   WBC 7.4 04/19/2019   HGB 13.3 04/19/2019   HCT 39.3 04/19/2019   MCV 87 04/19/2019   PLT 197 04/19/2019      Component Value Date/Time   NA 136 04/19/2019 0929   K 5.1 04/19/2019 0929   CL 101 04/19/2019 0929   CO2 24 04/19/2019 0929   GLUCOSE 101 (H) 04/19/2019 0929   GLUCOSE 84 07/10/2014 0944   BUN 11  04/19/2019 0929   CREATININE 0.87 04/19/2019 0929   CREATININE 0.76 06/27/2014 0915   CALCIUM 9.4 04/19/2019 0929   PROT 7.1 04/19/2019 0929   ALBUMIN 4.4 04/19/2019 0929   AST 18 04/19/2019 0929   ALT 17 04/19/2019 0929   ALKPHOS 111 04/19/2019 0929   BILITOT 0.4 04/19/2019 0929   GFRNONAA 75 04/19/2019 0929   GFRAA 86 04/19/2019 0929     ASSESSMENT AND PLAN 59 y.o. year old female  has a past medical history of Asthma, Back pain, Cardiac/pericardial tamponade (06/11/2014), Depression, Hypertension, MS (multiple sclerosis) ([place]), Muscle pain, Obesity, Periodic limb movement disorder (PLMD) (07/15/2016), Pleural effusion, bilateral (06/30/2014), and Uveitis. here with:  1.  Multiple sclerosis  -Continue Tysabri -Blood work today  2.  Restless leg syndrome  -Continue Mirapex  Advised if symptoms worsen or she develops new symptoms she should let us know.  Follow-up in 6 months or sooner if needed   I spent 15 minutes of face-to-face and non-face-to-face time with patient.  This included previsit chart review, lab review, study review, order entry, electronic health record documentation, patient education.  Ward Givens, MSN, NP-C 10/17/2019, 9:24 AM [facility] [address], [place], [place] [address] [phone]

## 2019-10-17 NOTE — Patient Instructions (Signed)
Your Plan:  Continue tysabri  Continue Mirapex Blood work today If your symptoms worsen or you develop new symptoms please let us know.   Thank you for coming to see [place] at [facility]. I hope we have been able to provide you high quality care today.  You may receive a patient satisfaction survey over the next few weeks. We would appreciate your feedback and comments so that we may continue to improve ourselves and the health of our patients.

## 2019-10-18 ENCOUNTER — Telehealth: Payer: Self-pay

## 2019-10-18 LAB — COMPREHENSIVE METABOLIC PANEL
ALT: 14 IU/L (ref 0–32)
AST: 19 IU/L (ref 0–40)
Albumin/Globulin Ratio: 1.6 (ref 1.2–2.2)
Albumin: 4.1 g/dL (ref 3.8–4.9)
Alkaline Phosphatase: 109 IU/L (ref 39–117)
BUN/Creatinine Ratio: 23 (ref 9–23)
BUN: 19 mg/dL (ref 6–24)
Bilirubin Total: 0.3 mg/dL (ref 0.0–1.2)
CO2: 25 mmol/L (ref 20–29)
Calcium: 8.6 mg/dL — ABNORMAL LOW (ref 8.7–10.2)
Chloride: 104 mmol/L (ref 96–106)
Creatinine, Ser: 0.81 mg/dL (ref 0.57–1.00)
GFR calc Af Amer: 94 mL/min/{1.73_m2} (ref >59)
GFR calc non Af Amer: 81 mL/min/{1.73_m2} (ref >59)
Globulin, Total: 2.5 g/dL (ref 1.5–4.5)
Glucose: 75 mg/dL (ref 65–99)
Potassium: 4.9 mmol/L (ref 3.5–5.2)
Sodium: 139 mmol/L (ref 134–144)
Total Protein: 6.6 g/dL (ref 6.0–8.5)

## 2019-10-18 LAB — CBC WITH DIFFERENTIAL/PLATELET
Basophils Absolute: 0.1 10*3/uL (ref 0.0–0.2)
Basos: 1 %
EOS (ABSOLUTE): 0.3 10*3/uL (ref 0.0–0.4)
Eos: 4 %
Hematocrit: 38.7 % (ref 34.0–46.6)
Hemoglobin: 13 g/dL (ref 11.1–15.9)
Immature Grans (Abs): 0 10*3/uL (ref 0.0–0.1)
Immature Granulocytes: 0 %
Lymphocytes Absolute: 3 10*3/uL (ref 0.7–3.1)
Lymphs: 42 %
MCH: 30.4 pg (ref 26.6–33.0)
MCHC: 33.6 g/dL (ref 31.5–35.7)
MCV: 90 fL (ref 79–97)
Monocytes Absolute: 0.8 10*3/uL (ref 0.1–0.9)
Monocytes: 10 %
Neutrophils Absolute: 3.2 10*3/uL (ref 1.4–7.0)
Neutrophils: 43 %
Platelets: 215 10*3/uL (ref 150–450)
RBC: 4.28 x10E6/uL (ref 3.77–5.28)
RDW: 13.6 % (ref 11.7–15.4)
WBC: 7.3 10*3/uL (ref 3.4–10.8)

## 2019-10-18 NOTE — Telephone Encounter (Signed)
-----   Message from Ward Givens, NP sent at 10/18/2019 11:58 AM EST ----- Blood work is unremarkable.  Waiting for JCV results.  Please call patient

## 2019-10-18 NOTE — Telephone Encounter (Signed)
Unable to get in contact with the patient. LVM letting her know that her blood work was normal and that were are still waiting for her JCV labs to come in . Office number was provided in case she has any questions or concerns .

## 2019-10-20 DIAGNOSIS — G35 Multiple sclerosis: Secondary | ICD-10-CM | POA: Diagnosis not present

## 2019-10-20 NOTE — Progress Notes (Signed)
I have read the note, and I agree with the clinical assessment and plan.  Izekiel Flegel K Parrie Rasco   

## 2019-10-25 NOTE — Telephone Encounter (Signed)
Please make patient aware

## 2019-10-25 NOTE — Telephone Encounter (Signed)
Received [facility], JCV Negative index 0.16.

## 2019-10-25 NOTE — Telephone Encounter (Signed)
LMVM for pt that JCV antibody negative.  She is to call back if questions.

## 2019-11-16 DIAGNOSIS — M25512 Pain in left shoulder: Secondary | ICD-10-CM | POA: Diagnosis not present

## 2019-11-16 DIAGNOSIS — M65331 Trigger finger, right middle finger: Secondary | ICD-10-CM | POA: Diagnosis not present

## 2019-11-16 DIAGNOSIS — Z6835 Body mass index (BMI) 35.0-35.9, adult: Secondary | ICD-10-CM | POA: Diagnosis not present

## 2019-11-17 DIAGNOSIS — G35 Multiple sclerosis: Secondary | ICD-10-CM | POA: Diagnosis not present

## 2019-12-12 ENCOUNTER — Telehealth: Payer: Self-pay

## 2019-12-12 NOTE — Telephone Encounter (Signed)
Mauri Reading reauthorization form completed and sign by [facility] NP. Fax twice to [place] at [phone]. Fax twice and confirmed.

## 2019-12-13 NOTE — Telephone Encounter (Signed)
Receive fax that tysabri infusion approve till 08/10/2020 for every 6 months.

## 2019-12-15 DIAGNOSIS — G35 Multiple sclerosis: Secondary | ICD-10-CM | POA: Diagnosis not present

## 2019-12-20 ENCOUNTER — Other Ambulatory Visit: Payer: Self-pay | Admitting: Neurology

## 2019-12-20 ENCOUNTER — Other Ambulatory Visit: Payer: Self-pay | Admitting: Adult Health

## 2020-01-12 ENCOUNTER — Other Ambulatory Visit: Payer: Self-pay | Admitting: *Deleted

## 2020-01-12 DIAGNOSIS — G35 Multiple sclerosis: Secondary | ICD-10-CM | POA: Diagnosis not present

## 2020-01-18 ENCOUNTER — Other Ambulatory Visit: Payer: Self-pay | Admitting: *Deleted

## 2020-01-18 MED ORDER — PRAMIPEXOLE DIHYDROCHLORIDE 0.25 MG PO TABS: 0.2500 mg | ORAL_TABLET | Freq: Every day | ORAL | 3 refills | Status: DC

## 2020-01-18 NOTE — Telephone Encounter (Signed)
I spoke to pt and [facility] is [facility] and it is for the 0.25mg  tabsl.

## 2020-02-09 DIAGNOSIS — M25512 Pain in left shoulder: Secondary | ICD-10-CM | POA: Diagnosis not present

## 2020-02-09 DIAGNOSIS — G35 Multiple sclerosis: Secondary | ICD-10-CM | POA: Diagnosis not present

## 2020-02-24 DIAGNOSIS — M25512 Pain in left shoulder: Secondary | ICD-10-CM | POA: Diagnosis not present

## 2020-03-13 ENCOUNTER — Other Ambulatory Visit: Payer: Self-pay | Admitting: Adult Health

## 2020-03-13 DIAGNOSIS — G35 Multiple sclerosis: Secondary | ICD-10-CM | POA: Diagnosis not present

## 2020-03-13 MED ORDER — ARMODAFINIL 150 MG PO TABS: 150.0000 mg | ORAL_TABLET | Freq: Every day | ORAL | 1 refills | Status: DC

## 2020-03-13 NOTE — Telephone Encounter (Signed)
Pt states she is needing refill on Armodafinil 150 MG tablet [[id number]]. Please advise, best call back # is [phone].

## 2020-03-15 ENCOUNTER — Telehealth: Payer: Self-pay

## 2020-03-15 NOTE — Telephone Encounter (Signed)
PA for Armodafinil 150 mg has been submitted via [facility] Key: [id number] - PA Case ID: [id number] - Rx #: [id number]. PA should be good from 03/15/2020-03/15/2021.

## 2020-04-10 DIAGNOSIS — G35 Multiple sclerosis: Secondary | ICD-10-CM | POA: Diagnosis not present

## 2020-04-24 ENCOUNTER — Encounter: Payer: Self-pay | Admitting: Adult Health

## 2020-04-24 ENCOUNTER — Ambulatory Visit: Payer: BLUE CROSS/BLUE SHIELD | Admitting: Adult Health

## 2020-04-24 VITALS — BP 138/82 | HR 69 | Ht 70.0 in | Wt 268.2 lb

## 2020-04-24 DIAGNOSIS — G35 Multiple sclerosis: Secondary | ICD-10-CM | POA: Diagnosis not present

## 2020-04-24 DIAGNOSIS — R413 Other amnesia: Secondary | ICD-10-CM

## 2020-04-24 DIAGNOSIS — Z5181 Encounter for therapeutic drug level monitoring: Secondary | ICD-10-CM

## 2020-04-24 NOTE — Patient Instructions (Signed)
Your Plan:  Continue Tysabri  Blood work today MRI brain       Thank you for coming to see [place] at [facility]. I hope we have been able to provide you high quality care today.  You may receive a patient satisfaction survey over the next few weeks. We would appreciate your feedback and comments so that we may continue to improve ourselves and the health of our patients.

## 2020-04-24 NOTE — Progress Notes (Signed)
I have read the note, and I agree with the clinical assessment and plan.  Candy Ziegler K Evva Din   

## 2020-04-24 NOTE — Progress Notes (Signed)
PATIENT: [name] DOB: June 04, 1961  REASON FOR VISIT: follow up HISTORY FROM: patient  HISTORY OF PRESENT ILLNESS: Today 04/24/20:  [name] is a 59 year old female with a history of relapsed remitting multiple sclerosis.  She returns today for follow-up.  She remains on Tysabri.  She denies any new symptoms.  No new changes with the bowels or bladder.  No new weak numbness or weakness.  No changes with her gait or balance.  She does state that she changed to another job in March.  She has been under a lot of stress.  She states that this has resulted in some panic attacks.  She also noted some cognitive changes.  She reports the inability to concentrate and focus at times.  Also reports that she can be forgetful with things like where she placed her keys it.  She denies any significant changes at home.  Able to complete all ADLs independently.  Operates a motor vehicle without difficulty.  Manages her own finances without difficulty.  She returns today for an evaluation.  HISTORY 10/17/19:  [name] is a 59 year old female with a history of relapsed remitting multiple sclerosis.  She returns today for follow-up.  She continues on Tysabri.  Denies any new symptoms.  Denies any changes with her vision.  No changes with the bowels or bladder with the exception that she has diarrhea more often now.  No change in her diet.  She reports that she had a fall in December.  Reports that she slipped on her back deck.  Reports that she injured her left knee but this is slowly improving.  Denies any new weakness.  Continues to have numbness down the left arm.  Has not followed up with Dr. Ronnald Ramp.  REVIEW OF SYSTEMS: Out of a complete 14 system review of symptoms, the patient complains only of the following symptoms, and all other reviewed systems are negative.  See HPI  ALLERGIES: Allergies  Allergen Reactions  . Penicillins Rash    HOME MEDICATIONS: Outpatient Medications Prior to Visit   Medication Sig Dispense Refill  . ALPRAZolam (XANAX) 0.5 MG tablet Take 1 tablet 1 hour prior to MRI and another if needed when arrives at site. 2 tablet 0  . Armodafinil 150 MG tablet Take 1 tablet (150 mg total) by mouth daily. 90 tablet 1  . atorvastatin (LIPITOR) 10 MG tablet 10 mg daily.    Marland Kitchen escitalopram (LEXAPRO) 10 MG tablet Take 10 mg by mouth daily.    Marland Kitchen ibuprofen (ADVIL,MOTRIN) 200 MG tablet Take 400 mg by mouth every 6 (six) hours as needed.    Marland Kitchen losartan (COZAAR) 50 MG tablet Take 1 tablet by mouth daily.    . natalizumab 300 mg in sodium chloride 0.9 % 100 mL Inject 300 mg into the vein every 28 (twenty-eight) days.    . pantoprazole (PROTONIX) 20 MG tablet 20 mg daily.    . pramipexole (MIRAPEX) 0.25 MG tablet Take 1 tablet (0.25 mg total) by mouth at bedtime. 90 tablet 3  . Pramipexole Dihydrochloride 0.375 MG TB24 TAKE 1 TABLET (0.375 MG TOTAL) BY MOUTH AT BEDTIME. 90 tablet 1  . prednisoLONE acetate (PRED FORTE) 1 % ophthalmic suspension As needed    . VENTOLIN HFA 108 (90 Base) MCG/ACT inhaler      No facility-administered medications prior to visit.    PAST MEDICAL HISTORY: Past Medical History:  Diagnosis Date  . Asthma   . Back pain   . Cardiac/pericardial tamponade 06/11/2014  s/p pericardial window 06/16/2014 per Dr. Darcey Nora  . Depression   . Hypertension   . MS (multiple sclerosis) ([place])   . Muscle pain   . Obesity   . Periodic limb movement disorder (PLMD) 07/15/2016  . Pleural effusion, bilateral 06/30/2014  . Uveitis    Bilateral    PAST SURGICAL HISTORY: Past Surgical History:  Procedure Laterality Date  . microdisectomy  2019   lumbar spine  . PERICARDIAL TAP N/A 06/11/2014   Procedure: PERICARDIAL TAP;  Surgeon: Blane Ohara, MD;  Location[facility] C[facility] LAB;  Service: Cardiovascular;  Laterality: N/A;  . SUBXYPHOID PERICARDIAL WINDOW N/A 06/16/2014   Procedure: SUBXYPHOID PERICARDIAL WINDOW;  Surgeon: Ivin Poot, MD;  Location: [facility];   Service: Thoracic;  Laterality: N/A;  . TEE WITHOUT CARDIOVERSION N/A 06/16/2014   Procedure: TRANSESOPHAGEAL ECHOCARDIOGRAM (TEE);  Surgeon: Ivin Poot, MD;  Location: [facility];  Service: Thoracic;  Laterality: N/A;  . uterine ablasion  2012    FAMILY HISTORY: Family History  Problem Relation Age of Onset  . Breast cancer Mother   . Hypertension Mother   . Diabetes Mother   . Heart attack Father   . Epilepsy Father   . Endocrine tumor Brother     SOCIAL HISTORY: Social History   Socioeconomic History  . Marital status: Single    Spouse name: Not on file  . Number of children: 0  . Years of education: MA  . Highest education level: Not on file  Occupational History  . Occupation: Soil scientist for [employer]  Tobacco Use  . Smoking status: Never Smoker  . Smokeless tobacco: Never Used  Substance and Sexual Activity  . Alcohol use: Yes    Alcohol/week: 0.0 standard drinks    Comment: occasional  . Drug use: No    Comment: marijuana  . Sexual activity: Not Currently  Other Topics Concern  . Not on file  Social History Narrative   Patient lives at home with her wife [name])    Patient works full time.   Arboriculturist.   Right handed.   Caffeine two cups daily.   Social Determinants of Health   Financial Resource Strain:   . Difficulty of Paying Living Expenses: Not on file  Food Insecurity:   . Worried About Charity fundraiser in the Last Year: Not on file  . Ran Out of Food in the Last Year: Not on file  Transportation Needs:   . Lack of Transportation (Medical): Not on file  . Lack of Transportation (Non-Medical): Not on file  Physical Activity:   . Days of Exercise per Week: Not on file  . Minutes of Exercise per Session: Not on file  Stress:   . Feeling of Stress : Not on file  Social Connections:   . Frequency of Communication with Friends and Family: Not on file  . Frequency of Social Gatherings with Friends and Family: Not on file  . Attends  Religious Services: Not on file  . Active Member of Clubs or Organizations: Not on file  . Attends Archivist Meetings: Not on file  . Marital Status: Not on file  Intimate Partner Violence:   . Fear of Current or Ex-Partner: Not on file  . Emotionally Abused: Not on file  . Physically Abused: Not on file  . Sexually Abused: Not on file      PHYSICAL EXAM  Vitals:   04/24/20 0830  BP: 138/82  Pulse: 69  Weight: 268  lb 3.2 oz (121.7 kg)  Height: 5\' 10"  (1.778 m)   Body mass index is 38.48 kg/m.  Generalized: Well developed, in no acute distress   Neurological examination  Mentation: Alert oriented to time, place, history taking. Follows all commands speech and language fluent Cranial nerve II-XII: Pupils were equal round reactive to light. Extraocular movements were full, visual field were full on confrontational test. Facial sensation and strength were normal. Uvula tongue midline. Head turning and shoulder shrug  were normal and symmetric. Motor: The motor testing reveals 5 over 5 strength of all 4 extremities. Good symmetric motor tone is noted throughout.  Sensory: Sensory testing is intact to soft touch on all 4 extremities. No evidence of extinction is noted.  Coordination: Cerebellar testing reveals good finger-nose-finger and heel-to-shin bilaterally.  Gait and station: Gait is normal.  Reflexes: Deep tendon reflexes are symmetric and normal bilaterally.   DIAGNOSTIC DATA (LABS, IMAGING, TESTING) - I reviewed patient records, labs, notes, testing and imaging myself where available.  Lab Results  Component Value Date   WBC 7.3 10/17/2019   HGB 13.0 10/17/2019   HCT 38.7 10/17/2019   MCV 90 10/17/2019   PLT 215 10/17/2019      Component Value Date/Time   NA 139 10/17/2019 0952   K 4.9 10/17/2019 0952   CL 104 10/17/2019 0952   CO2 25 10/17/2019 0952   GLUCOSE 75 10/17/2019 0952   GLUCOSE 84 07/10/2014 0944   BUN 19 10/17/2019 0952   CREATININE  0.81 10/17/2019 0952   CREATININE 0.76 06/27/2014 0915   CALCIUM 8.6 (L) 10/17/2019 0952   PROT 6.6 10/17/2019 0952   ALBUMIN 4.1 10/17/2019 0952   AST 19 10/17/2019 0952   ALT 14 10/17/2019 0952   ALKPHOS 109 10/17/2019 0952   BILITOT 0.3 10/17/2019 0952   GFRNONAA 81 10/17/2019 0952   GFRAA 94 10/17/2019 0952       ASSESSMENT AND PLAN 59 y.o. year old female  has a past medical history of Asthma, Back pain, Cardiac/pericardial tamponade (06/11/2014), Depression, Hypertension, MS (multiple sclerosis) ([place]), Muscle pain, Obesity, Periodic limb movement disorder (PLMD) (07/15/2016), Pleural effusion, bilateral (06/30/2014), and Uveitis. here with:  1.  Multiple sclerosis   Continue Tysabri  Blood work today  MRI of the brain and cervical spine ordered  2.  Cognitive changes   Could be due to stress?  We will check blood work today in the past has had low B12 levels  Pending results of MRI we may consider neuropsychological evaluation  She will follow-up in 6 months or sooner if needed   I spent 30 minutes of face-to-face and non-face-to-face time with patient.  This included previsit chart review, lab review, study review, order entry, electronic health record documentation, patient education.  Ward Givens, MSN, NP-C 04/24/2020, 8:49 AM [facility] [address] [place], [place] [address] [phone]

## 2020-04-25 ENCOUNTER — Telehealth: Payer: Self-pay | Admitting: *Deleted

## 2020-04-25 ENCOUNTER — Telehealth: Payer: Self-pay | Admitting: Adult Health

## 2020-04-25 LAB — COMPREHENSIVE METABOLIC PANEL
ALT: 17 IU/L (ref 0–32)
AST: 23 IU/L (ref 0–40)
Albumin/Globulin Ratio: 1.6 (ref 1.2–2.2)
Albumin: 4.3 g/dL (ref 3.8–4.9)
Alkaline Phosphatase: 104 IU/L (ref 44–121)
BUN/Creatinine Ratio: 16 (ref 9–23)
BUN: 12 mg/dL (ref 6–24)
Bilirubin Total: 0.4 mg/dL (ref 0.0–1.2)
CO2: 25 mmol/L (ref 20–29)
Calcium: 9.4 mg/dL (ref 8.7–10.2)
Chloride: 101 mmol/L (ref 96–106)
Creatinine, Ser: 0.77 mg/dL (ref 0.57–1.00)
GFR calc Af Amer: 99 mL/min/{1.73_m2} (ref >59)
GFR calc non Af Amer: 86 mL/min/{1.73_m2} (ref >59)
Globulin, Total: 2.7 g/dL (ref 1.5–4.5)
Glucose: 68 mg/dL (ref 65–99)
Potassium: 4.9 mmol/L (ref 3.5–5.2)
Sodium: 142 mmol/L (ref 134–144)
Total Protein: 7 g/dL (ref 6.0–8.5)

## 2020-04-25 LAB — CBC WITH DIFFERENTIAL/PLATELET
Basophils Absolute: 0.1 10*3/uL (ref 0.0–0.2)
Basos: 1 %
EOS (ABSOLUTE): 0.1 10*3/uL (ref 0.0–0.4)
Eos: 2 %
Hematocrit: 41.3 % (ref 34.0–46.6)
Hemoglobin: 13.6 g/dL (ref 11.1–15.9)
Immature Grans (Abs): 0.1 10*3/uL (ref 0.0–0.1)
Immature Granulocytes: 1 %
Lymphocytes Absolute: 3 10*3/uL (ref 0.7–3.1)
Lymphs: 37 %
MCH: 29.8 pg (ref 26.6–33.0)
MCHC: 32.9 g/dL (ref 31.5–35.7)
MCV: 90 fL (ref 79–97)
Monocytes Absolute: 0.8 10*3/uL (ref 0.1–0.9)
Monocytes: 10 %
Neutrophils Absolute: 4 10*3/uL (ref 1.4–7.0)
Neutrophils: 49 %
Platelets: 202 10*3/uL (ref 150–450)
RBC: 4.57 x10E6/uL (ref 3.77–5.28)
RDW: 13.6 % (ref 11.7–15.4)
WBC: 8 10*3/uL (ref 3.4–10.8)

## 2020-04-25 LAB — VITAMIN B12: Vitamin B-12: 479 pg/mL (ref 232–1245)

## 2020-04-25 LAB — TSH: TSH: 1.3 u[IU]/mL (ref 0.450–4.500)

## 2020-04-25 NOTE — Telephone Encounter (Signed)
-----   Message from Ward Givens, NP sent at 04/25/2020 12:50 PM EDT ----- Labs results are unremarkable. Please call patient with results.

## 2020-04-25 NOTE — Telephone Encounter (Signed)
On 04/24/20 lvm for pt to call back i need the # on the back of her card

## 2020-04-25 NOTE — Telephone Encounter (Signed)
Mychart messaged pt Labs results are unremarkable.

## 2020-04-30 ENCOUNTER — Telehealth: Payer: Self-pay | Admitting: *Deleted

## 2020-04-30 NOTE — Telephone Encounter (Signed)
Received fax 04-30-20 to day with results for JCV Stratify NEGATIVE index 0.12.

## 2020-05-01 MED ORDER — ALPRAZOLAM 0.5 MG PO TABS: ORAL_TABLET | ORAL | 0 refills | Status: DC

## 2020-05-01 NOTE — Telephone Encounter (Signed)
no to the covid questions MR Brain w/wo contrast & MR Cervical spine w/wo contrast [facility] Auth: [id number] (exp. 04/24/20 to 05/23/20). Patient is scheduled at [facility] for 05/09/20.  Also, the number the patient gave me on the back of her card did not work so I was not able to get her benefits. I informed her that we will fill her insurance and whatever comes back she can set up a payment plan.   She also stated that she is not claustrophobic but she has restless legs so she would like a Xanax to help with that. She is aware to have a driver.

## 2020-05-01 NOTE — Telephone Encounter (Signed)
Called patient, LVM advising her of Xanax Rx and the she must have a driver. Left # for questions.

## 2020-05-01 NOTE — Telephone Encounter (Signed)
Order sent for xanax. Please make sure patient has driver

## 2020-05-01 NOTE — Addendum Note (Signed)
Addended by: Trudie Buckler on: 05/01/2020 04:01 PM   Modules accepted: Orders

## 2020-05-08 DIAGNOSIS — G35 Multiple sclerosis: Secondary | ICD-10-CM | POA: Diagnosis not present

## 2020-05-09 ENCOUNTER — Ambulatory Visit (INDEPENDENT_AMBULATORY_CARE_PROVIDER_SITE_OTHER): Payer: BLUE CROSS/BLUE SHIELD

## 2020-05-09 DIAGNOSIS — G35 Multiple sclerosis: Secondary | ICD-10-CM

## 2020-05-09 MED ORDER — GADOBENATE DIMEGLUMINE 529 MG/ML IV SOLN
20.0000 mL | Freq: Once | INTRAVENOUS | Status: AC | PRN
  Administered 2020-05-09: 20 mL via INTRAVENOUS

## 2020-06-07 DIAGNOSIS — G35 Multiple sclerosis: Secondary | ICD-10-CM | POA: Diagnosis not present

## 2020-06-19 ENCOUNTER — Other Ambulatory Visit: Payer: Self-pay | Admitting: Neurology

## 2020-06-19 ENCOUNTER — Ambulatory Visit: Payer: Self-pay | Admitting: Emergency Medicine

## 2020-06-19 ENCOUNTER — Telehealth: Payer: Self-pay | Admitting: Emergency Medicine

## 2020-06-19 NOTE — Telephone Encounter (Signed)
Received confirmation by fax that patient is enrolled in the [place] for Tysabri 06/19/20 through 01/14/21 for infusions for MS at [facility].  Patient enrollment number [id number]

## 2020-07-12 DIAGNOSIS — G35 Multiple sclerosis: Secondary | ICD-10-CM | POA: Diagnosis not present

## 2020-07-18 ENCOUNTER — Other Ambulatory Visit: Payer: Self-pay | Admitting: Internal Medicine

## 2020-07-18 ENCOUNTER — Ambulatory Visit
Admission: RE | Admit: 2020-07-18 | Discharge: 2020-07-18 | Disposition: A | Discharge status: Home / Self Care | Payer: BLUE CROSS/BLUE SHIELD | Source: Ambulatory Visit | Attending: Internal Medicine | Admitting: Internal Medicine

## 2020-07-18 DIAGNOSIS — Z0000 Encounter for general adult medical examination without abnormal findings: Secondary | ICD-10-CM | POA: Diagnosis not present

## 2020-07-18 DIAGNOSIS — M25562 Pain in left knee: Secondary | ICD-10-CM

## 2020-07-18 DIAGNOSIS — M1712 Unilateral primary osteoarthritis, left knee: Secondary | ICD-10-CM | POA: Diagnosis not present

## 2020-07-18 DIAGNOSIS — M25462 Effusion, left knee: Secondary | ICD-10-CM | POA: Diagnosis not present

## 2020-07-18 DIAGNOSIS — Z23 Encounter for immunization: Secondary | ICD-10-CM | POA: Diagnosis not present

## 2020-07-18 DIAGNOSIS — I10 Essential (primary) hypertension: Secondary | ICD-10-CM | POA: Diagnosis not present

## 2020-07-18 DIAGNOSIS — Z13220 Encounter for screening for lipoid disorders: Secondary | ICD-10-CM | POA: Diagnosis not present

## 2020-07-18 DIAGNOSIS — F3341 Major depressive disorder, recurrent, in partial remission: Secondary | ICD-10-CM | POA: Diagnosis not present

## 2020-08-08 DIAGNOSIS — M25562 Pain in left knee: Secondary | ICD-10-CM | POA: Diagnosis not present

## 2020-08-09 DIAGNOSIS — G35 Multiple sclerosis: Secondary | ICD-10-CM | POA: Diagnosis not present

## 2020-08-14 DIAGNOSIS — M25562 Pain in left knee: Secondary | ICD-10-CM | POA: Diagnosis not present

## 2020-08-30 DIAGNOSIS — M542 Cervicalgia: Secondary | ICD-10-CM | POA: Diagnosis not present

## 2020-08-30 DIAGNOSIS — M5412 Radiculopathy, cervical region: Secondary | ICD-10-CM | POA: Diagnosis not present

## 2020-09-17 DIAGNOSIS — G35 Multiple sclerosis: Secondary | ICD-10-CM | POA: Diagnosis not present

## 2020-09-18 DIAGNOSIS — M5412 Radiculopathy, cervical region: Secondary | ICD-10-CM | POA: Diagnosis not present

## 2020-09-18 DIAGNOSIS — G5603 Carpal tunnel syndrome, bilateral upper limbs: Secondary | ICD-10-CM | POA: Diagnosis not present

## 2020-10-02 DIAGNOSIS — M25562 Pain in left knee: Secondary | ICD-10-CM | POA: Diagnosis not present

## 2020-10-05 DIAGNOSIS — M25462 Effusion, left knee: Secondary | ICD-10-CM | POA: Diagnosis not present

## 2020-10-05 DIAGNOSIS — M25562 Pain in left knee: Secondary | ICD-10-CM | POA: Diagnosis not present

## 2020-10-05 DIAGNOSIS — M1712 Unilateral primary osteoarthritis, left knee: Secondary | ICD-10-CM | POA: Diagnosis not present

## 2020-10-12 ENCOUNTER — Other Ambulatory Visit: Payer: Self-pay

## 2020-10-12 ENCOUNTER — Encounter: Payer: Self-pay | Admitting: Physical Therapy

## 2020-10-12 ENCOUNTER — Ambulatory Visit: Payer: BC Managed Care – PPO | Attending: Family Medicine | Admitting: Physical Therapy

## 2020-10-12 DIAGNOSIS — M25662 Stiffness of left knee, not elsewhere classified: Secondary | ICD-10-CM | POA: Diagnosis not present

## 2020-10-12 DIAGNOSIS — G8929 Other chronic pain: Secondary | ICD-10-CM | POA: Insufficient documentation

## 2020-10-12 DIAGNOSIS — R262 Difficulty in walking, not elsewhere classified: Secondary | ICD-10-CM

## 2020-10-12 DIAGNOSIS — R600 Localized edema: Secondary | ICD-10-CM

## 2020-10-12 DIAGNOSIS — M25562 Pain in left knee: Secondary | ICD-10-CM | POA: Diagnosis not present

## 2020-10-12 NOTE — Therapy (Signed)
[facility] [address]  [place], [place], [address] Phone: [phone]   Fax:  [phone]  Physical Therapy Evaluation  Patient Details  Name: [name] MRN: [id number] Date of Birth: [birth year] Referring Provider (PT): Rhina Brackett, MD   Encounter Date: 10/12/2020   PT End of Session - 10/12/20 1203    Visit Number 1    Number of Visits 13    Date for PT Re-Evaluation 11/23/20    Authorization Type [employer]    PT Start Time 0848    PT Stop Time 0927    PT Time Calculation (min) 39 min    Activity Tolerance Patient tolerated treatment well;Patient limited by pain    Behavior During Therapy [facility] for tasks assessed/performed           Past Medical History:  Diagnosis Date  . Asthma   . Back pain   . Cardiac/pericardial tamponade 06/11/2014   s/p pericardial window 06/16/2014 per Dr. Darcey Nora  . Depression   . Hypertension   . MS (multiple sclerosis) ([place])   . Muscle pain   . Obesity   . Periodic limb movement disorder (PLMD) 07/15/2016  . Pleural effusion, bilateral 06/30/2014  . Uveitis    Bilateral    Past Surgical History:  Procedure Laterality Date  . microdisectomy  2019   lumbar spine  . PERICARDIAL TAP N/A 06/11/2014   Procedure: PERICARDIAL TAP;  Surgeon: Blane Ohara, MD;  Location: [facility] CATH LAB;  Service: Cardiovascular;  Laterality: N/A;  . SUBXYPHOID PERICARDIAL WINDOW N/A 06/16/2014   Procedure: SUBXYPHOID PERICARDIAL WINDOW;  Surgeon: Ivin Poot, MD;  Location: [place];  Service: Thoracic;  Laterality: N/A;  . TEE WITHOUT CARDIOVERSION N/A 06/16/2014   Procedure: TRANSESOPHAGEAL ECHOCARDIOGRAM (TEE);  Surgeon: Ivin Poot, MD;  Location: [place];  Service: Thoracic;  Laterality: N/A;  . uterine ablasion  2012    There were no vitals filed for this visit.    Subjective Assessment - 10/12/20 0850    Subjective Patient reports that in January 2021 she slipped and fell on  ice with the L knee positioned into extreme flexion. Later that year she misstopped when walking on cobblestones and felt a pop in her L knee. It got progressively worse since then. Was given a cortisone shot which did not help. A couple days later she felt another pop while standing and noticed increased swelling throughout the knee. Last week, her knee was aspirated and was given a knee brace. Was told that she has a torn MCL but is not a candidate for surgery. Still has pain and swelling in the medial knee and reports instability. Using a SPC at home. Pain is worse with sitting cross-legged and with prolonged walking, L sidelying. Better with brace. Has not been icing recently.    Pertinent History B pleural effusion, periodic limb movement disorder, MS, HTN, depression, back pain, asthma, microdiscectomy 2019    Limitations Sitting;Lifting;Standing;Walking;House hold activities    How long can you sit comfortably? unlimited    How long can you stand comfortably? 30 min    How long can you walk comfortably? 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 Suspected developing subchondral fracture of medial tibial plateau. medial meniscal tear; Large joint effusion with 6 mm loose body at the posterior jt line    Patient Stated Goals decrease pain    Currently in Pain?  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  No    Is the patient reluctant to leave their home because of a fear of falling?  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    Leisure playing music- would like to play in Conserv[employer], museum/gallery again, travel      Cognition   Overall Cognitive Status Within Functional Limits for tasks assessed      Sensation   Light Touch Appears Intact      Coordination   Gross Motor Movements are Fluid and Coordinated Yes      Posture/Postural Control   Posture/Postural Control Postural limitations    Postural Limitations Rounded Shoulders;Forward head      ROM / Strength   AROM / PROM / Strength AROM;PROM;Strength      AROM   AROM Assessment Site Knee    Right/Left Knee Right;Left    Right Knee Extension 0    Right Knee Flexion 132    Left Knee Extension 4    Left Knee Flexion 122      PROM   PROM Assessment Site Knee    Right/Left Knee Right;Left    Right Knee Extension 0    Right Knee Flexion 132    Left Knee Extension 4   pain   Left Knee Flexion 125   pain     Strength   Strength Assessment Site Hip;Knee;Ankle    Right/Left Hip Right;Left    Right Hip Flexion 4+/5    Right Hip ABduction 4/5    Right Hip ADduction 4/5    Left Hip Flexion 4+/5    Left Hip ABduction 4/5    Left Hip ADduction 4/5    Right/Left Knee Right;Left    Right Knee Flexion 4+/5    Right Knee Extension  4+/5    Left Knee Flexion 4/5    Left Knee Extension 4/5    Right/Left Ankle Right;Left    Right Ankle Dorsiflexion  4+/5    Right Ankle Plantar Flexion 4/5    Left Ankle Dorsiflexion 4+/5    Left Ankle Plantar Flexion 4/5      Flexibility   Soft Tissue Assessment /Muscle Length yes    Hamstrings B WFL    Quadriceps R WNL, L mildly tight and painful in mod thomas      Palpation   Patella mobility B patellae with normal mobility all directions; TTP on L    Palpation comment L medial/posterior knee TTP and with large edema accumulation; L anteromedial knee TTP      Ambulation/Gait   Assistive device None    Gait Pattern Step-through pattern;Decreased hip/knee flexion - left;Decreased stance time - left;Decreased step length - right   slight R LE circumduction d/t brace locked in extension                     Objective measurements completed on examination: See above findings.               PT Education - 10/12/20 1203    Education Details prognosis, POC, HEP; advised patient to walk to tolerance but avoid starting a new fitness program at this time    Person(s) Educated Patient    Methods Explanation;Demonstration;Tactile cues;Verbal cues;Handout    Comprehension Returned demonstration;Verbalized understanding            PT Short Term Goals - 10/12/20 1211      PT SHORT TERM GOAL #1   Title Patient to be independent with initial HEP.    Time 3    Period Weeks    Status New    Target Date 11/02/20             PT Long Term Goals - 10/12/20 1212      PT LONG TERM GOAL #1   Title Patient to be independent with advanced HEP.    Time 6    Period Weeks    Status New    Target Date 11/23/20      PT LONG TERM GOAL #2   Title Patient to demonstrate B LE strength >/=4+/5.    Time 6    Period Weeks    Status New    Target Date 11/23/20      PT LONG TERM GOAL #3   Title Patient to demonstrate L knee AROM/PROM [facility] and without pain limiting.     Time 6    Period Weeks    Status New    Target Date 11/23/20      PT LONG TERM GOAL #4   Title Patient to report tolerance for being on her feet for 2 hours without pain limiting.    Time 6    Period Weeks    Status New    Target Date 11/23/20      PT LONG TERM GOAL #5   Title Patient to demonstrate reciprocal alternating stair climbing pattern up/down 13 steps with handrail as needed without pain and with good quad stability.    Time 6    Period Weeks    Status New    Target Date 11/23/20                  Plan - 10/12/20 1204    Clinical Impression Statement Patient is a 60 y/o F presenting to O[facility]T with c/o L knee pain for about the past year after a fall and misstep in 2021. MRI from 10/02/20 revealed suspected L tibial plateau  fx, extensive full thickness cartilage loss, and medial meniscal tear. L knee was aspirated at last MD appointment and patient was instructed to wear a knee brace locked in extension- per patient, she is to unlock it next week.  Patient currently ambulates without AD in the community, [facility] at home. Pain is located over the L medial knee and worse with sitting cross-legged, prolonged walking, L sidelying. Patient today presenting with rounded shoulders and forward head posture, limited and painful L knee ROM, decreased B hip and L knee strength, TTP in L medial/posterior knee and with large edema accumulation, and gait deviations. Patient was educated on gentle ROM and strengthening HEP- patient reported understanding. Would benefit from skilled PT services 2x/week for 6 weeks to address aforementioned impairments.    Personal Factors and Comorbidities Age;Comorbidity 3+;Fitness;Past/Current Experience;Profession;Time since onset of injury/illness/exacerbation    Comorbidities B pleural effusion, periodic limb movement disorder, MS, HTN, depression, back pain, asthma, microdiscectomy 2019    Examination-Activity Limitations  Bathing;Bend;Carry;Dressing;Hygiene/Grooming;Lift;Locomotion Level;Reach Overhead;Transfers;Stand;Stairs;Squat;Sleep;Bed Mobility    Examination-Participation Restrictions Church;Cleaning;Community Activity;Driving;Laundry;Meal Prep;Yard Work;Shop    Stability/Clinical Decision Making Stable/Uncomplicated    Clinical Decision Making Low    Rehab Potential Good    PT Frequency 2x / week    PT Duration 6 weeks    PT Treatment/Interventions ADLs/Self Care Home Management;Cryotherapy;Electrical Stimulation;Iontophoresis 4mg /ml Dexamethasone;Moist Heat;Balance training;Therapeutic exercise;Therapeutic activities;Functional mobility training;Stair training;Gait training;Ultrasound;Neuromuscular re-education;Patient/family education;Manual techniques;Vasopneumatic Device;Taping;Energy conservation;Dry needling;Passive range of motion    PT Next Visit Plan knee FOTO; reassess HEP; progress L knee ROM and strength per tolerance    Consulted and Agree with Plan of Care Patient           Patient will benefit from skilled therapeutic intervention in order to improve the following deficits and impairments:  Abnormal gait,Increased edema,Pain,Increased fascial restricitons,Decreased strength,Decreased activity tolerance,Decreased balance,Difficulty walking,Increased muscle spasms,Improper body mechanics,Decreased range of motion,Impaired flexibility,Postural dysfunction  Visit Diagnosis: Chronic pain of left knee  Difficulty in walking, not elsewhere classified  Localized edema  Stiffness of left knee, not elsewhere classified     Problem List Patient Active Problem List   Diagnosis Date Noted  . Periodic limb movement disorder (PLMD) 07/15/2016  . Pleural effusion, bilateral 06/30/2014  . Shortness of breath 06/29/2014  . Hypertension 06/29/2014  . Pericarditis 06/29/2014  . Pericardial effusion 06/16/2014  . Pericardial effusion, acute 06/14/2014  . Cardiac/pericardial tamponade 06/11/2014   . Cardiac tamponade 06/11/2014  . Multiple sclerosis ([employer]) 11/21/2013    Janene Harvey, PT, DPT 10/12/20 12:15 PM   [facility] [address]  [place], [place], [address] Phone: [phone]   Fax:  [phone]  Name: [name] MRN: [id number] Date of Birth: 1961-06-04

## 2020-10-16 ENCOUNTER — Other Ambulatory Visit: Payer: Self-pay | Admitting: Adult Health

## 2020-10-16 ENCOUNTER — Telehealth: Payer: Self-pay | Admitting: *Deleted

## 2020-10-16 DIAGNOSIS — G35 Multiple sclerosis: Secondary | ICD-10-CM

## 2020-10-16 DIAGNOSIS — Z5181 Encounter for therapeutic drug level monitoring: Secondary | ICD-10-CM

## 2020-10-16 NOTE — Addendum Note (Signed)
Addended by: Inis Sizer D on: 10/16/2020 08:53 AM   Modules accepted: Orders

## 2020-10-16 NOTE — Telephone Encounter (Signed)
Placed speciman in out box for [facility] to pick up.

## 2020-10-17 LAB — CBC WITH DIFFERENTIAL/PLATELET
Basophils Absolute: 0.1 10*3/uL (ref 0.0–0.2)
Basos: 1 %
EOS (ABSOLUTE): 0.2 10*3/uL (ref 0.0–0.4)
Eos: 3 %
Hematocrit: 39.7 % (ref 34.0–46.6)
Hemoglobin: 13.2 g/dL (ref 11.1–15.9)
Immature Grans (Abs): 0 10*3/uL (ref 0.0–0.1)
Immature Granulocytes: 0 %
Lymphocytes Absolute: 3.3 10*3/uL — ABNORMAL HIGH (ref 0.7–3.1)
Lymphs: 44 %
MCH: 29.5 pg (ref 26.6–33.0)
MCHC: 33.2 g/dL (ref 31.5–35.7)
MCV: 89 fL (ref 79–97)
Monocytes Absolute: 0.8 10*3/uL (ref 0.1–0.9)
Monocytes: 10 %
Neutrophils Absolute: 3.2 10*3/uL (ref 1.4–7.0)
Neutrophils: 42 %
Platelets: 178 10*3/uL (ref 150–450)
RBC: 4.48 x10E6/uL (ref 3.77–5.28)
RDW: 13.8 % (ref 11.7–15.4)
WBC: 7.5 10*3/uL (ref 3.4–10.8)

## 2020-10-17 LAB — COMPREHENSIVE METABOLIC PANEL
ALT: 15 IU/L (ref 0–32)
AST: 15 IU/L (ref 0–40)
Albumin/Globulin Ratio: 1.5 (ref 1.2–2.2)
Albumin: 4 g/dL (ref 3.8–4.9)
Alkaline Phosphatase: 99 IU/L (ref 44–121)
BUN/Creatinine Ratio: 24 — ABNORMAL HIGH (ref 9–23)
BUN: 19 mg/dL (ref 6–24)
Bilirubin Total: 0.3 mg/dL (ref 0.0–1.2)
CO2: 21 mmol/L (ref 20–29)
Calcium: 9.1 mg/dL (ref 8.7–10.2)
Chloride: 104 mmol/L (ref 96–106)
Creatinine, Ser: 0.8 mg/dL (ref 0.57–1.00)
Globulin, Total: 2.6 g/dL (ref 1.5–4.5)
Glucose: 83 mg/dL (ref 65–99)
Potassium: 4.1 mmol/L (ref 3.5–5.2)
Sodium: 141 mmol/L (ref 134–144)
Total Protein: 6.6 g/dL (ref 6.0–8.5)
eGFR: 86 mL/min/{1.73_m2} (ref >59)

## 2020-10-22 ENCOUNTER — Encounter: Payer: Self-pay | Admitting: Adult Health

## 2020-10-22 ENCOUNTER — Ambulatory Visit: Payer: BC Managed Care – PPO | Admitting: Adult Health

## 2020-10-22 VITALS — BP 120/72 | HR 70 | Ht 70.0 in | Wt 270.0 lb

## 2020-10-22 DIAGNOSIS — G35 Multiple sclerosis: Secondary | ICD-10-CM | POA: Diagnosis not present

## 2020-10-22 DIAGNOSIS — G4761 Periodic limb movement disorder: Secondary | ICD-10-CM

## 2020-10-22 NOTE — Progress Notes (Signed)
PATIENT: [name] DOB: 04-01-1961  REASON FOR VISIT: follow up HISTORY FROM: patient  HISTORY OF PRESENT ILLNESS: Today 10/22/20:  [name] is a 60 year old female with a history of relapsed remitting multiple sclerosis.  She returns today for follow-up.  She remains on Tysabri.  She had blood work prior to this visit.  We are still waiting on the JCV index.  The patient states overall she is remained stable.  She continues to have numbness in the hands left side greater than right.  She did have nerve conduction studies completed at [place] [facility] but has not received the results.  She also reports that she has a torn MCL in the left knee.  They have not surgically corrected this.  Patient feels that bowels and bladder have remained stable.  No significant changes in her vision.  She states that she may be having a flareup of uveitis in the left eye.  She has not seen her ophthalmologist recently.  She does have prednisone drops that she can use when this happens.  04/24/20 [name] is a 60 year old female with a history of relapsed remitting multiple sclerosis.  She returns today for follow-up.  She remains on Tysabri.  She denies any new symptoms.  No new changes with the bowels or bladder.  No new weak numbness or weakness.  No changes with her gait or balance.  She does state that she changed to another job in March.  She has been under a lot of stress.  She states that this has resulted in some panic attacks.  She also noted some cognitive changes.  She reports the inability to concentrate and focus at times.  Also reports that she can be forgetful with things like where she placed her keys it.  She denies any significant changes at home.  Able to complete all ADLs independently.  Operates a motor vehicle without difficulty.  Manages her own finances without difficulty.  She returns today for an evaluation.  HISTORY 10/17/19:  [name] is a 60 year old female with a history of  relapsed remitting multiple sclerosis.  She returns today for follow-up.  She continues on Tysabri.  Denies any new symptoms.  Denies any changes with her vision.  No changes with the bowels or bladder with the exception that she has diarrhea more often now.  No change in her diet.  She reports that she had a fall in December.  Reports that she slipped on her back deck.  Reports that she injured her left knee but this is slowly improving.  Denies any new weakness.  Continues to have numbness down the left arm.  Has not followed up with Dr. Ronnald Ramp.  REVIEW OF SYSTEMS: Out of a complete 14 system review of symptoms, the patient complains only of the following symptoms, and all other reviewed systems are negative.  See HPI  ALLERGIES: Allergies  Allergen Reactions  . Penicillins Rash    HOME MEDICATIONS: Outpatient Medications Prior to Visit  Medication Sig Dispense Refill  . ALPRAZolam (XANAX) 0.5 MG tablet Take 1 tablet 1 hour prior to MRI and another if needed when arrives at site. 2 tablet 0  . Armodafinil 150 MG tablet Take 1 tablet (150 mg total) by mouth daily. 90 tablet 1  . atorvastatin (LIPITOR) 10 MG tablet 10 mg daily.    . diclofenac (VOLTAREN) 75 MG EC tablet Take 75 mg by mouth 2 (two) times daily.    Marland Kitchen escitalopram (LEXAPRO) 10 MG tablet  Take 10 mg by mouth daily.    Marland Kitchen ibuprofen (ADVIL,MOTRIN) 200 MG tablet Take 400 mg by mouth every 6 (six) hours as needed.    Marland Kitchen losartan (COZAAR) 50 MG tablet Take 1 tablet by mouth daily.    . natalizumab 300 mg in sodium chloride 0.9 % 100 mL Inject 300 mg into the vein every 28 (twenty-eight) days.    . pantoprazole (PROTONIX) 20 MG tablet 20 mg daily.    . pramipexole (MIRAPEX) 0.25 MG tablet Take 1 tablet (0.25 mg total) by mouth at bedtime. 90 tablet 3  . Pramipexole Dihydrochloride 0.375 MG TB24 TAKE 1 TABLET (0.375 MG TOTAL) BY MOUTH AT BEDTIME. 90 tablet 1  . prednisoLONE acetate (PRED FORTE) 1 % ophthalmic suspension As needed    .  VENTOLIN HFA 108 (90 Base) MCG/ACT inhaler     . diclofenac (VOLTAREN) 50 MG EC tablet Take 50 mg by mouth 2 (two) times daily.     No facility-administered medications prior to visit.    PAST MEDICAL HISTORY: Past Medical History:  Diagnosis Date  . Asthma   . Back pain   . Cardiac/pericardial tamponade 06/11/2014   s/p pericardial window 06/16/2014 per Dr. Darcey Nora  . Depression   . Hypertension   . MS (multiple sclerosis) ([place])   . Muscle pain   . Obesity   . Periodic limb movement disorder (PLMD) 07/15/2016  . Pleural effusion, bilateral 06/30/2014  . Uveitis    Bilateral    PAST SURGICAL HISTORY: Past Surgical History:  Procedure Laterality Date  . microdisectomy  2019   lumbar spine  . PERICARDIAL TAP N/A 06/11/2014   Procedure: PERICARDIAL TAP;  Surgeon: Blane Ohara, MD;  Location: [facility] CATH LAB;  Service: Cardiovascular;  Laterality: N/A;  . SUBXYPHOID PERICARDIAL WINDOW N/A 06/16/2014   Procedure: SUBXYPHOID PERICARDIAL WINDOW;  Surgeon: Ivin Poot, MD;  Location: [place];  Service: Thoracic;  Laterality: N/A;  . TEE WITHOUT CARDIOVERSION N/A 06/16/2014   Procedure: TRANSESOPHAGEAL ECHOCARDIOGRAM (TEE);  Surgeon: Ivin Poot, MD;  Location: [place];  Service: Thoracic;  Laterality: N/A;  . uterine ablasion  2012    FAMILY HISTORY: Family History  Problem Relation Age of Onset  . Breast cancer Mother   . Hypertension Mother   . Diabetes Mother   . Heart attack Father   . Epilepsy Father   . Endocrine tumor Brother     SOCIAL HISTORY: Social History   Socioeconomic History  . Marital status: Single    Spouse name: Not on file  . Number of children: 0  . Years of education: MA  . Highest education level: Not on file  Occupational History  . Occupation: Soil scientist for [employer]  Tobacco Use  . Smoking status: Never Smoker  . Smokeless tobacco: Never Used  Substance and Sexual Activity  . Alcohol use: Yes    Alcohol/week: 0.0 standard  drinks    Comment: occasional  . Drug use: No    Comment: marijuana  . Sexual activity: Not Currently  Other Topics Concern  . Not on file  Social History Narrative   Patient lives at home with her wife [name])    Patient works full time.   Arboriculturist.   Right handed.   Caffeine two cups daily.   Social Determinants of Health   Financial Resource Strain: Not on file  Food Insecurity: Not on file  Transportation Needs: Not on file  Physical Activity: Not on file  Stress: Not  on file  Social Connections: Not on file  Intimate Partner Violence: Not on file      PHYSICAL EXAM  Vitals:   10/22/20 0938  BP: 120/72  Pulse: 70  Weight: 270 lb (122.5 kg)  Height: 5\' 10"  (1.778 m)   Body mass index is 38.74 kg/m.  Generalized: Well developed, in no acute distress   Neurological examination  Mentation: Alert oriented to time, place, history taking. Follows all commands speech and language fluent Cranial nerve II-XII: Pupils were equal round reactive to light. Extraocular movements were full, visual field were full on confrontational test. Facial sensation and strength were normal. Uvula tongue midline. Head turning and shoulder shrug  were normal and symmetric. Motor: The motor testing reveals 5 over 5 strength of all 4 extremities. Good symmetric motor tone is noted throughout.  Sensory: Sensory testing is intact to soft touch on all 4 extremities. No evidence of extinction is noted.  Coordination: Cerebellar testing reveals good finger-nose-finger and heel-to-shin bilaterally.  Gait and station: Gait is normal.  Brace on the left knee Reflexes: Deep tendon reflexes are symmetric and normal bilaterally.   DIAGNOSTIC DATA (LABS, IMAGING, TESTING) - I reviewed patient records, labs, notes, testing and imaging myself where available.  Lab Results  Component Value Date   WBC 7.5 10/16/2020   HGB 13.2 10/16/2020   HCT 39.7 10/16/2020   MCV 89 10/16/2020   PLT 178  10/16/2020      Component Value Date/Time   NA 141 10/16/2020 0855   K 4.1 10/16/2020 0855   CL 104 10/16/2020 0855   CO2 21 10/16/2020 0855   GLUCOSE 83 10/16/2020 0855   GLUCOSE 84 07/10/2014 0944   BUN 19 10/16/2020 0855   CREATININE 0.80 10/16/2020 0855   CREATININE 0.76 06/27/2014 0915   CALCIUM 9.1 10/16/2020 0855   PROT 6.6 10/16/2020 0855   ALBUMIN 4.0 10/16/2020 0855   AST 15 10/16/2020 0855   ALT 15 10/16/2020 0855   ALKPHOS 99 10/16/2020 0855   BILITOT 0.3 10/16/2020 0855   GFRNONAA 86 04/24/2020 0915   GFRAA 99 04/24/2020 0915       ASSESSMENT AND PLAN 60 y.o. year old female  has a past medical history of Asthma, Back pain, Cardiac/pericardial tamponade (06/11/2014), Depression, Hypertension, MS (multiple sclerosis) ([place]), Muscle pain, Obesity, Periodic limb movement disorder (PLMD) (07/15/2016), Pleural effusion, bilateral (06/30/2014), and Uveitis. here with:  1.  Multiple sclerosis   Continue Tysabri  We will call to check on JCV results   2.  Restless leg   Continue Mirapex long-acting 0.375 mg at bedtime  Continue Mirapex short acting 0.25 mg at bedtime  She will follow-up in 6 months or sooner if needed   I spent 30 minutes of face-to-face and non-face-to-face time with patient.  This included previsit chart review, lab review, study review, order entry, electronic health record documentation, patient education.  Ward Givens, MSN, NP-C 10/22/2020, 9:40 AM [facility] [address], [place], [place] [address] [phone]

## 2020-10-22 NOTE — Patient Instructions (Signed)
Your Plan:  Continue mirapex  Continue Tysabri If your symptoms worsen or you develop new symptoms please let us know.    Thank you for coming to see [place] at [facility]. I hope we have been able to provide you high quality care today.  You may receive a patient satisfaction survey over the next few weeks. We would appreciate your feedback and comments so that we may continue to improve ourselves and the health of our patients.

## 2020-10-22 NOTE — Progress Notes (Signed)
I have read the note, and I agree with the clinical assessment and plan.  Shelbylynn Walczyk K Dannon Nguyenthi   

## 2020-10-23 ENCOUNTER — Encounter: Payer: Self-pay | Admitting: Physical Therapy

## 2020-10-23 ENCOUNTER — Ambulatory Visit: Payer: BC Managed Care – PPO | Admitting: Physical Therapy

## 2020-10-23 ENCOUNTER — Other Ambulatory Visit: Payer: Self-pay

## 2020-10-23 DIAGNOSIS — R262 Difficulty in walking, not elsewhere classified: Secondary | ICD-10-CM

## 2020-10-23 DIAGNOSIS — M25562 Pain in left knee: Secondary | ICD-10-CM

## 2020-10-23 DIAGNOSIS — M25662 Stiffness of left knee, not elsewhere classified: Secondary | ICD-10-CM | POA: Diagnosis not present

## 2020-10-23 DIAGNOSIS — R600 Localized edema: Secondary | ICD-10-CM

## 2020-10-23 DIAGNOSIS — G8929 Other chronic pain: Secondary | ICD-10-CM | POA: Diagnosis not present

## 2020-10-23 NOTE — Telephone Encounter (Signed)
Called for JCV results.  To fax.

## 2020-10-23 NOTE — Therapy (Signed)
[facility] [address]  [place], [place], [address] Phone: [phone]   Fax:  [phone]  Physical Therapy Treatment  Patient Details  Name: [name] MRN: [id number] Date of Birth: [birth year] Referring Provider (PT): Rhina Brackett, MD   Encounter Date: 10/23/2020   PT End of Session - 10/23/20 0936    Visit Number 2    Number of Visits 13    Date for PT Re-Evaluation 11/23/20    Authorization Type [employer]    Authorization - Visit Number 1    Authorization - Number of Visits 30    PT Start Time [phone]   pt late   PT Stop Time 0940    PT Time Calculation (min) 49 min    Activity Tolerance Patient tolerated treatment well;Patient limited by pain    Behavior During Therapy [facility] for tasks assessed/performed           Past Medical History:  Diagnosis Date  . Asthma   . Back pain   . Cardiac/pericardial tamponade 06/11/2014   s/p pericardial window 06/16/2014 per Dr. Darcey Nora  . Depression   . Hypertension   . MS (multiple sclerosis) ([place])   . Muscle pain   . Obesity   . Periodic limb movement disorder (PLMD) 07/15/2016  . Pleural effusion, bilateral 06/30/2014  . Uveitis    Bilateral    Past Surgical History:  Procedure Laterality Date  . microdisectomy  2019   lumbar spine  . PERICARDIAL TAP N/A 06/11/2014   Procedure: PERICARDIAL TAP;  Surgeon: Blane Ohara, MD;  Location: [facility] CATH LAB;  Service: Cardiovascular;  Laterality: N/A;  . SUBXYPHOID PERICARDIAL WINDOW N/A 06/16/2014   Procedure: SUBXYPHOID PERICARDIAL WINDOW;  Surgeon: Ivin Poot, MD;  Location: [place];  Service: Thoracic;  Laterality: N/A;  . TEE WITHOUT CARDIOVERSION N/A 06/16/2014   Procedure: TRANSESOPHAGEAL ECHOCARDIOGRAM (TEE);  Surgeon: Ivin Poot, MD;  Location: [place];  Service: Thoracic;  Laterality: N/A;  . uterine ablasion  2012    There were no vitals filed for this visit.   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 Suspected developing subchondral fracture of medial tibial plateau. medial meniscal tear; Large joint effusion with 6 mm loose body at the posterior jt line    Patient Stated Goals decrease pain    Currently in Pain? Yes    Pain Score 4     Pain Location Knee    Pain Orientation Left    Pain Descriptors / Indicators Sharp;Aching    Pain Type Chronic pain              [facility] PT Assessment - 10/23/20 0001      Observation/Other Assessments   Focus on Therapeutic Outcomes (FOTO)  Knee: 65                         [facility] Adult PT Treatment/Exercise - 10/23/20 0001      Exercises   Exercises Knee/Hip      Knee/Hip Exercises: Aerobic   Recumbent Bike L1 x 3 min      Knee/Hip Exercises: Standing   Heel  Raises Both;1 set;15 reps    Heel Raises Limitations at counter    Terminal Knee Extension Strengthening;Left;1 set;10 reps    Theraband Level (Terminal Knee Extension) Level 3 (Green)    Terminal Knee Extension Limitations 10x3" with chair support    Functional Squat 1 set;10 reps    Functional Squat Limitations ~45 deg at counter   cues to avoid deep squat to avoid pain     Knee/Hip Exercises: Seated   Hamstring Curl Strengthening;Left;10 reps;2 sets    Hamstring Limitations green TB   good control     Knee/Hip Exercises: Supine   Quad Sets Strengthening;Left;1 set;5 sets    Quad Sets Limitations 5x10" with towel roll under knee    Heel Slides AAROM;Left;1 set;10 reps    Heel Slides Limitations 10x3" with strap and peanut ball    Bridges with Ball Squeeze Strengthening;1 set;10 reps   limited ROM   Bridges with  Clamshell Strengthening;1 set;10 reps   red TB   Straight Leg Raises Strengthening;Left;1 set;10 reps    Straight Leg Raises Limitations cues for quad set before each rep   very mild quad lag     Modalities   Modalities Vasopneumatic      Vasopneumatic   Number Minutes Vasopneumatic  10 minutes    Vasopnuematic Location  Knee   L   Vasopneumatic Pressure Low    Vasopneumatic Temperature  coldest                  PT Education - 10/23/20 0934    Education Details educated patient on avoiding prolonged dependent positioning and performing ankle pumps to address edema    Person(s) Educated Patient    Methods Explanation;Demonstration    Comprehension Verbalized understanding            PT Short Term Goals - 10/23/20 0937      PT SHORT TERM GOAL #1   Title Patient to be independent with initial HEP.    Time 3    Period Weeks    Status On-going    Target Date 11/02/20             PT Long Term Goals - 10/23/20 0937      PT LONG TERM GOAL #1   Title Patient to be independent with advanced HEP.    Time 6    Period Weeks    Status On-going      PT LONG TERM GOAL #2   Title Patient to demonstrate B LE strength >/=4+/5.    Time 6    Period Weeks    Status On-going      PT LONG TERM GOAL #3   Title Patient to demonstrate L knee AROM/PROM WFL and without pain limiting.    Time 6    Period Weeks    Status On-going      PT LONG TERM GOAL #4   Title Patient to report tolerance for being on her feet for 2 hours without pain limiting.    Time 6    Period Weeks    Status On-going      PT LONG TERM GOAL #5   Title Patient to demonstrate reciprocal alternating stair climbing pattern up/down 13 steps with handrail as needed without pain and with good quad stability.    Time 6    Period Weeks    Status On-going                 Plan - 10/23/20 2952  Clinical Impression Statement Patient reporting no new complaints today. Reporting partial compliance  with HEP and denies issues. Bike warm up was limited today d/t c/o L medial knee pain. Patient reports that she unlocked her brace yesterday and did have increased pain last night in the knee. Patient also with questions about edema accumulation in the L lower leg, thus educated patient on avoiding prolonged dependent positioning and performing ankle pumps to address this. Reviewed HEP with patient demonstrating very slight quad lag with SLR's. Limited ROM demonstrated with bridges d/t hip flexor tightness vs. glute weakness. Initiated standing LE strengthening ther-ex with patient requiring verbal and manual cues to maintain proper form. Patient with c/o moderate knee pain with squats, which improved after cues to decrease squat depth. Ended session with [place] to L knee for post-exercise soreness and edema. Patient without complaints at end of session.    Comorbidities B pleural effusion, periodic limb movement disorder, MS, HTN, depression, back pain, asthma, microdiscectomy 2019    PT Treatment/Interventions ADLs/Self Care Home Management;Cryotherapy;Electrical Stimulation;Iontophoresis 4mg /ml Dexamethasone;Moist Heat;Balance training;Therapeutic exercise;Therapeutic activities;Functional mobility training;Stair training;Gait training;Ultrasound;Neuromuscular re-education;Patient/family education;Manual techniques;Vasopneumatic Device;Taping;Energy conservation;Dry needling;Passive range of motion    PT Next Visit Plan progress L knee ROM and strength per tolerance    Consulted and Agree with Plan of Care Patient           Patient will benefit from skilled therapeutic intervention in order to improve the following deficits and impairments:  Abnormal gait,Increased edema,Pain,Increased fascial restricitons,Decreased strength,Decreased activity tolerance,Decreased balance,Difficulty walking,Increased muscle spasms,Improper body mechanics,Decreased range of motion,Impaired flexibility,Postural  dysfunction  Visit Diagnosis: Chronic pain of left knee  Difficulty in walking, not elsewhere classified  Localized edema  Stiffness of left knee, not elsewhere classified     Problem List Patient Active Problem List   Diagnosis Date Noted  . Periodic limb movement disorder (PLMD) 07/15/2016  . Pleural effusion, bilateral 06/30/2014  . Shortness of breath 06/29/2014  . Hypertension 06/29/2014  . Pericarditis 06/29/2014  . Pericardial effusion 06/16/2014  . Pericardial effusion, acute 06/14/2014  . Cardiac/pericardial tamponade 06/11/2014  . Cardiac tamponade 06/11/2014  . Multiple sclerosis ([place]) 11/21/2013     Janene Harvey, PT, DPT 10/23/20 9:44 AM   [facility] [address]  [place], [place], [address] Phone: [phone]   Fax:  [phone]  Name: [name] MRN: [id number] Date of Birth: [birth year]

## 2020-10-24 NOTE — Telephone Encounter (Signed)
Resulted JCV ANtibody POSITIVE.  INDEX 3.7.

## 2020-10-24 NOTE — Telephone Encounter (Signed)
Dr. Jannifer Franklin,   Patient's previous JCV results have been negative.  Last index was 0.12.  Has now increased to 3.7 would you recommend that she stay on Tysabri and continue to check JCV or switch to another agent

## 2020-10-25 NOTE — Telephone Encounter (Signed)
Spoke with patient and informed her of positive JCV, NP's message. She is agreeable to switch to [facility], understands she'll get a call from Ginnie Smart, [place] coordinator next week. Patient verbalized understanding, appreciation.

## 2020-10-25 NOTE — Telephone Encounter (Signed)
Please call the patient and advised that her JCV index is positive.  I discussed with Dr. Jannifer Franklin and he recommends that we should switch to Ocrevus.  If patient is amenable, we can send a note to [facility] for her to get started

## 2020-10-29 NOTE — Addendum Note (Signed)
Addended by: Lester [place] A on: 10/29/2020 10:58 AM   Modules accepted: Orders

## 2020-10-29 NOTE — Telephone Encounter (Signed)
I called patient.  I discussed the process of starting Ocrevus.  She is agreeable to coming in to check further lab work and signing Ocrevus start form tomorrow morning.

## 2020-10-29 NOTE — Telephone Encounter (Signed)
Pt returned phone call. Would like a call back. 

## 2020-10-29 NOTE — Telephone Encounter (Signed)
I called patient to discuss starting Ocrevus.  No answer, left a voicemail asking her to call me back.

## 2020-10-30 ENCOUNTER — Other Ambulatory Visit (INDEPENDENT_AMBULATORY_CARE_PROVIDER_SITE_OTHER): Payer: Self-pay

## 2020-10-30 ENCOUNTER — Encounter: Payer: Self-pay | Admitting: Physical Therapy

## 2020-10-30 ENCOUNTER — Ambulatory Visit: Payer: BC Managed Care – PPO | Admitting: Physical Therapy

## 2020-10-30 DIAGNOSIS — R262 Difficulty in walking, not elsewhere classified: Secondary | ICD-10-CM

## 2020-10-30 DIAGNOSIS — G8929 Other chronic pain: Secondary | ICD-10-CM | POA: Diagnosis not present

## 2020-10-30 DIAGNOSIS — R600 Localized edema: Secondary | ICD-10-CM

## 2020-10-30 DIAGNOSIS — G35 Multiple sclerosis: Secondary | ICD-10-CM

## 2020-10-30 DIAGNOSIS — M25662 Stiffness of left knee, not elsewhere classified: Secondary | ICD-10-CM | POA: Diagnosis not present

## 2020-10-30 DIAGNOSIS — M25562 Pain in left knee: Secondary | ICD-10-CM

## 2020-10-30 DIAGNOSIS — Z5181 Encounter for therapeutic drug level monitoring: Secondary | ICD-10-CM

## 2020-10-30 DIAGNOSIS — Z0289 Encounter for other administrative examinations: Secondary | ICD-10-CM

## 2020-10-30 NOTE — Telephone Encounter (Signed)
Patient presented to office for lab work and start form.  She signed Ocrevus start form.  I will hold start form until lab work has been reviewed.

## 2020-10-30 NOTE — Therapy (Addendum)
[facility] [address]  [place], [place], [address] Phone: [phone]   Fax:  [phone]  Physical Therapy Treatment  Patient Details  Name: [name] MRN: [id number] Date of Birth: May 01, 1961 Referring Provider (PT): Rhina Brackett, MD   Encounter Date: 10/30/2020   PT End of Session - 10/30/20 0846    Visit Number 3    Number of Visits 13    Date for PT Re-Evaluation 11/23/20    Authorization Type [employer]    Authorization - Visit Number 2    Authorization - Number of Visits 30    PT Start Time 0806    PT Stop Time 0846    PT Time Calculation (min) 40 min    Activity Tolerance Patient tolerated treatment well    Behavior During Therapy [facility] for tasks assessed/performed           Past Medical History:  Diagnosis Date  . Asthma   . Back pain   . Cardiac/pericardial tamponade 06/11/2014   s/p pericardial window 06/16/2014 per Dr. Darcey Nora  . Depression   . Hypertension   . MS (multiple sclerosis) ([place])   . Muscle pain   . Obesity   . Periodic limb movement disorder (PLMD) 07/15/2016  . Pleural effusion, bilateral 06/30/2014  . Uveitis    Bilateral    Past Surgical History:  Procedure Laterality Date  . microdisectomy  2019   lumbar spine  . PERICARDIAL TAP N/A 06/11/2014   Procedure: PERICARDIAL TAP;  Surgeon: Blane Ohara, MD;  Location[facility] CATH LAB;  Service: Cardiovascular;  Laterality: N/A;  . SUBXYPHOID PERICARDIAL WINDOW N/A 06/16/2014   Procedure: SUBXYPHOID PERICARDIAL WINDOW;  Surgeon: Ivin Poot, MD;  Location: [place];  Service: Thoracic;  Laterality: N/A;  . TEE WITHOUT CARDIOVERSION N/A 06/16/2014   Procedure: TRANSESOPHAGEAL ECHOCARDIOGRAM (TEE);  Surgeon: Ivin Poot, MD;  Location: [place];  Service: Thoracic;  Laterality: N/A;  . uterine ablasion  2012    There were no vitals filed for this visit.   Subjective Assessment - 10/30/20 0808    Subjective Reporting  that she has noticed more difficultly lifting her L toes up when she walks. Otherwise feeling pretty good. Denies questions on HEP. Has an appointment with an orthopedic surgeon on the 29th.    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 Suspected developing subchondral fracture of medial tibial plateau. medial meniscal tear; Large joint effusion with 6 mm loose body at the posterior jt line    Patient Stated Goals decrease pain    Currently in Pain? No/denies              [facility] PT Assessment - 10/30/20 0001      AROM   AROM Assessment Site Ankle    Right/Left Ankle Right;Left    Right Ankle Dorsiflexion 6    Left Ankle Dorsiflexion 3      Strength   Right Ankle Dorsiflexion 4+/5    Left Ankle Dorsiflexion 4+/5                         [facility] Adult PT Treatment/Exercise - 10/30/20 0001      Ambulation/Gait   Ambulation Distance (Feet) 180 Feet    Assistive device None    Gait Pattern --  decreased heel-toe pattern on L, antalgia on L LE, increased R toe out     Knee/Hip Exercises: Stretches   Piriformis Stretch Left;1 rep;30 seconds   sitting modified ER stretch with folded pillow under knee; some pain in supine fig 4   Gastroc Stretch Right;Left;1 rep;30 seconds    Gastroc Stretch Limitations runner's stretch      Knee/Hip Exercises: Aerobic   Nustep L3 x 6 min (UEs/LEs)      Knee/Hip Exercises: Seated   Long Arc Quad Strengthening;Left;1 set;10 reps    Long Arc Quad Weight 2 lbs.    Long [employer] Limitations 2x10 good TKE   good tolerance; cues to decrease eccentric speed     Knee/Hip Exercises: Supine   Bridges Strengthening;1 set;10 reps    Bridges Limitations difficulty and limited ROM    Straight Leg Raises Strengthening;Left;1 set;10 reps     Straight Leg Raises Limitations last 5 reps with 1#; no quad lag    Straight Leg Raise with External Rotation Strengthening;Left;1 set;10 reps    Straight Leg Raise with External Rotation Limitations difficulty maintaining TKE   completed 7 reps before fatigue and c/o "pop in my knee"     Knee/Hip Exercises: Sidelying   Hip ABduction Strengthening;Right;Left;1 set;10 reps    Hip ABduction Limitations cues for alignment                  PT Education - 10/30/20 0846    Education Details update to HEP    Person(s) Educated Patient    Methods Explanation;Demonstration;Tactile cues;Verbal cues;Handout    Comprehension Verbalized understanding;Returned demonstration            PT Short Term Goals - 10/23/20 0937      PT SHORT TERM GOAL #1   Title Patient to be independent with initial HEP.    Time 3    Period Weeks    Status On-going    Target Date 11/02/20             PT Long Term Goals - 10/23/20 0937      PT LONG TERM GOAL #1   Title Patient to be independent with advanced HEP.    Time 6    Period Weeks    Status On-going      PT LONG TERM GOAL #2   Title Patient to demonstrate B LE strength >/=4+/5.    Time 6    Period Weeks    Status On-going      PT LONG TERM GOAL #3   Title Patient to demonstrate L knee AROM/PROM WFL and without pain limiting.    Time 6    Period Weeks    Status On-going      PT LONG TERM GOAL #4   Title Patient to report tolerance for being on her feet for 2 hours without pain limiting.    Time 6    Period Weeks    Status On-going      PT LONG TERM GOAL #5   Title Patient to demonstrate reciprocal alternating stair climbing pattern up/down 13 steps with handrail as needed without pain and with good quad stability.    Time 6    Period Weeks    Status On-going                 Plan - 10/30/20 0847    Clinical Impression Statement Patient reporting no complaints after last session. Does report more difficulty lifting  her L toes up  when ambulating- discussed with patient that this could be a complication of her MS. Also notes that she is scheduled with an orthopedic surgeon later this month. Denies questions on HEP. Assessed L ankle dorsiflexion AROM and strength, which revealed B limited ROM but good strength. Worked on gastroc stretching to address ROM deficits. Gait training was performed with evident decreased heel-toe pattern on L, antalgia on L LE, increased R toe out. Patient remarking on difficulty/pain crossing her L ankle over her knee, thus worked on hip stretching to address this. Patient quick to fatigue with SLR's but with good tolerance for lightly weighted LAQ today. Updated HEP with exercises that were well-tolerated today. Patient reported understanding and without complaints at end of session.    Comorbidities B pleural effusion, periodic limb movement disorder, MS, HTN, depression, back pain, asthma, microdiscectomy 2019    PT Treatment/Interventions ADLs/Self Care Home Management;Cryotherapy;Electrical Stimulation;Iontophoresis 60m/ml Dexamethasone;Moist Heat;Balance training;Therapeutic exercise;Therapeutic activities;Functional mobility training;Stair training;Gait training;Ultrasound;Neuromuscular re-education;Patient/family education;Manual techniques;Vasopneumatic Device;Taping;Energy conservation;Dry needling;Passive range of motion    PT Next Visit Plan progress L knee ROM and strength per tolerance    Consulted and Agree with Plan of Care Patient           Patient will benefit from skilled therapeutic intervention in order to improve the following deficits and impairments:  Abnormal gait,Increased edema,Pain,Increased fascial restricitons,Decreased strength,Decreased activity tolerance,Decreased balance,Difficulty walking,Increased muscle spasms,Improper body mechanics,Decreased range of motion,Impaired flexibility,Postural dysfunction  Visit Diagnosis: Chronic pain of left  knee  Difficulty in walking, not elsewhere classified  Localized edema  Stiffness of left knee, not elsewhere classified     Problem List Patient Active Problem List   Diagnosis Date Noted  . Periodic limb movement disorder (PLMD) 07/15/2016  . Pleural effusion, bilateral 06/30/2014  . Shortness of breath 06/29/2014  . Hypertension 06/29/2014  . Pericarditis 06/29/2014  . Pericardial effusion 06/16/2014  . Pericardial effusion, acute 06/14/2014  . Cardiac/pericardial tamponade 06/11/2014  . Cardiac tamponade 06/11/2014  . [place] 11/21/2013     YJanene Harvey PT, DPT 10/30/20 8:48 AM   [facility] [place] [address]Phone: [phone]  Fax:  [phone] Name: MMaryruth AppleMRN: [id number] of Birth: [birth year] PHYSICAL THERAPY DISCHARGE SUMMARY  Visits from Start of Care: 3  Current functional level related to goals / functional outcomes: Unable to assess; patient did not return   Remaining deficits: Unable to assess   Education / Equipment: HEP  Plan: Patient agrees to discharge.  Patient goals were not met. Patient is being discharged due to not returning since the last visit.  ?????     YJanene Harvey PT, DPT 12/27/20 2:46 PM

## 2020-10-30 NOTE — Addendum Note (Signed)
Addended by: Lester [place] A on: 10/30/2020 09:37 AM   Modules accepted: Orders

## 2020-11-01 LAB — QUANTIFERON-TB GOLD PLUS
QuantiFERON Mitogen Value: >10 IU/mL
QuantiFERON Nil Value: 0.09 IU/mL
QuantiFERON TB1 Ag Value: 0.24 IU/mL
QuantiFERON TB2 Ag Value: 0.19 IU/mL
QuantiFERON-TB Gold Plus: NEGATIVE

## 2020-11-01 LAB — IGG, IGA, IGM
IgA/Immunoglobulin A, Serum: 166 mg/dL (ref 87–352)
IgG (Immunoglobin G), Serum: 1089 mg/dL (ref 586–1602)
IgM (Immunoglobulin M), Srm: 84 mg/dL (ref 26–217)

## 2020-11-01 LAB — HEPATITIS B SURFACE ANTIBODY,QUALITATIVE: Hep B Surface Ab, Qual: NONREACTIVE

## 2020-11-01 LAB — HEPATITIS B SURFACE ANTIGEN: Hepatitis B Surface Ag: NEGATIVE

## 2020-11-01 LAB — HEPATITIS B CORE ANTIBODY, TOTAL: Hep B Core Total Ab: NEGATIVE

## 2020-11-05 NOTE — Telephone Encounter (Signed)
I spoke with Julie Blossom, NP.  Patient's lab work is fine and she may start [place].  I have faxed the Ocrevus start form to [place].  Received a receipt of confirmation.  I have given the Ocrevus order, Ocrevus start form, demographics, notes, imaging, labs to our infusion suite.  Order and start form has been copied and sent to medical records for scanning.  I called patient.  I discussed this with her.  If she has not heard about prior authorization and scheduling for her Ocrevus infusion in 2 weeks she will let me know.

## 2020-11-06 ENCOUNTER — Ambulatory Visit: Payer: BC Managed Care – PPO

## 2020-11-06 DIAGNOSIS — M1712 Unilateral primary osteoarthritis, left knee: Secondary | ICD-10-CM | POA: Diagnosis not present

## 2020-11-06 NOTE — Addendum Note (Signed)
Addended by: Lester [place] A on: 11/06/2020 05:09 PM   Modules accepted: Orders

## 2020-11-09 ENCOUNTER — Encounter: Payer: BC Managed Care – PPO | Admitting: Physical Therapy

## 2020-11-13 DIAGNOSIS — M5412 Radiculopathy, cervical region: Secondary | ICD-10-CM | POA: Diagnosis not present

## 2020-11-13 DIAGNOSIS — G5603 Carpal tunnel syndrome, bilateral upper limbs: Secondary | ICD-10-CM | POA: Diagnosis not present

## 2020-11-15 ENCOUNTER — Encounter: Payer: Self-pay | Admitting: Adult Health

## 2020-11-16 ENCOUNTER — Encounter: Payer: BC Managed Care – PPO | Admitting: Physical Therapy

## 2020-11-19 NOTE — Telephone Encounter (Signed)
Your thoughts Dr. Jannifer Franklin?

## 2020-11-22 DIAGNOSIS — G35 Multiple sclerosis: Secondary | ICD-10-CM | POA: Diagnosis not present

## 2020-11-23 ENCOUNTER — Encounter: Payer: BC Managed Care – PPO | Admitting: Physical Therapy

## 2020-11-26 NOTE — Telephone Encounter (Addendum)
Patient had her first Ocrevus infusion on November 22, 2020.  Per second infusion was on December 04, 2020.

## 2020-11-30 ENCOUNTER — Encounter: Payer: BC Managed Care – PPO | Admitting: Physical Therapy

## 2020-12-04 DIAGNOSIS — G35 Multiple sclerosis: Secondary | ICD-10-CM | POA: Diagnosis not present

## 2020-12-17 ENCOUNTER — Other Ambulatory Visit: Payer: Self-pay | Admitting: Adult Health

## 2020-12-19 DIAGNOSIS — M5030 Other cervical disc degeneration, unspecified cervical region: Secondary | ICD-10-CM | POA: Diagnosis not present

## 2020-12-19 DIAGNOSIS — D849 Immunodeficiency, unspecified: Secondary | ICD-10-CM | POA: Diagnosis not present

## 2020-12-19 DIAGNOSIS — I10 Essential (primary) hypertension: Secondary | ICD-10-CM | POA: Diagnosis not present

## 2020-12-19 DIAGNOSIS — G35 Multiple sclerosis: Secondary | ICD-10-CM | POA: Diagnosis not present

## 2020-12-21 DIAGNOSIS — H524 Presbyopia: Secondary | ICD-10-CM | POA: Diagnosis not present

## 2020-12-21 DIAGNOSIS — H5203 Hypermetropia, bilateral: Secondary | ICD-10-CM | POA: Diagnosis not present

## 2020-12-21 DIAGNOSIS — H26492 Other secondary cataract, left eye: Secondary | ICD-10-CM | POA: Diagnosis not present

## 2020-12-25 DIAGNOSIS — Z01818 Encounter for other preprocedural examination: Secondary | ICD-10-CM | POA: Diagnosis not present

## 2020-12-25 DIAGNOSIS — Z6841 Body Mass Index (BMI) 40.0 and over, adult: Secondary | ICD-10-CM | POA: Diagnosis not present

## 2020-12-25 DIAGNOSIS — M1712 Unilateral primary osteoarthritis, left knee: Secondary | ICD-10-CM | POA: Diagnosis not present

## 2020-12-25 DIAGNOSIS — M5412 Radiculopathy, cervical region: Secondary | ICD-10-CM | POA: Diagnosis not present

## 2020-12-26 ENCOUNTER — Other Ambulatory Visit: Payer: Self-pay | Admitting: Adult Health

## 2020-12-31 ENCOUNTER — Encounter: Payer: Self-pay | Admitting: Neurology

## 2020-12-31 ENCOUNTER — Telehealth: Payer: Self-pay | Admitting: Adult Health

## 2020-12-31 ENCOUNTER — Other Ambulatory Visit: Payer: Self-pay | Admitting: Adult Health

## 2020-12-31 NOTE — Telephone Encounter (Signed)
A letter was dictated and successfully faxed.

## 2020-12-31 NOTE — Telephone Encounter (Signed)
Pt called, urgently need surgical clearance letter faxed today, if not faxed today would need to reschedule my surgery. Would like a call from the nurse.

## 2020-12-31 NOTE — Telephone Encounter (Signed)
She is having cervical fusion looking back at previous note.  Her Ocrevus last 300mg  was 12-04-20.

## 2020-12-31 NOTE — Telephone Encounter (Signed)
Pt is asking for a letter of clearance to have surgery on this coming Wed the 25th since she has recently started Ocrevus.  Pt asking the letter be faxed to [facility](Dr Ronnald Ramp) ph [phone] fax [phone]

## 2021-01-02 DIAGNOSIS — M2578 Osteophyte, vertebrae: Secondary | ICD-10-CM | POA: Diagnosis not present

## 2021-01-02 DIAGNOSIS — M4802 Spinal stenosis, cervical region: Secondary | ICD-10-CM | POA: Diagnosis not present

## 2021-01-02 DIAGNOSIS — M5412 Radiculopathy, cervical region: Secondary | ICD-10-CM | POA: Diagnosis not present

## 2021-01-02 DIAGNOSIS — M4722 Other spondylosis with radiculopathy, cervical region: Secondary | ICD-10-CM | POA: Diagnosis not present

## 2021-01-02 DIAGNOSIS — M4712 Other spondylosis with myelopathy, cervical region: Secondary | ICD-10-CM | POA: Diagnosis not present

## 2021-01-02 DIAGNOSIS — G5602 Carpal tunnel syndrome, left upper limb: Secondary | ICD-10-CM | POA: Diagnosis not present

## 2021-01-29 DIAGNOSIS — M5412 Radiculopathy, cervical region: Secondary | ICD-10-CM | POA: Diagnosis not present

## 2021-02-01 DIAGNOSIS — M542 Cervicalgia: Secondary | ICD-10-CM | POA: Diagnosis not present

## 2021-02-01 DIAGNOSIS — M4322 Fusion of spine, cervical region: Secondary | ICD-10-CM | POA: Diagnosis not present

## 2021-02-04 DIAGNOSIS — M4322 Fusion of spine, cervical region: Secondary | ICD-10-CM | POA: Diagnosis not present

## 2021-02-04 DIAGNOSIS — M542 Cervicalgia: Secondary | ICD-10-CM | POA: Diagnosis not present

## 2021-02-06 DIAGNOSIS — M4322 Fusion of spine, cervical region: Secondary | ICD-10-CM | POA: Diagnosis not present

## 2021-02-06 DIAGNOSIS — M542 Cervicalgia: Secondary | ICD-10-CM | POA: Diagnosis not present

## 2021-02-14 DIAGNOSIS — M4322 Fusion of spine, cervical region: Secondary | ICD-10-CM | POA: Diagnosis not present

## 2021-02-14 DIAGNOSIS — M542 Cervicalgia: Secondary | ICD-10-CM | POA: Diagnosis not present

## 2021-02-19 DIAGNOSIS — M542 Cervicalgia: Secondary | ICD-10-CM | POA: Diagnosis not present

## 2021-02-19 DIAGNOSIS — M4322 Fusion of spine, cervical region: Secondary | ICD-10-CM | POA: Diagnosis not present

## 2021-02-21 DIAGNOSIS — M542 Cervicalgia: Secondary | ICD-10-CM | POA: Diagnosis not present

## 2021-02-21 DIAGNOSIS — M4322 Fusion of spine, cervical region: Secondary | ICD-10-CM | POA: Diagnosis not present

## 2021-02-26 DIAGNOSIS — M4322 Fusion of spine, cervical region: Secondary | ICD-10-CM | POA: Diagnosis not present

## 2021-02-26 DIAGNOSIS — M542 Cervicalgia: Secondary | ICD-10-CM | POA: Diagnosis not present

## 2021-02-28 DIAGNOSIS — M4322 Fusion of spine, cervical region: Secondary | ICD-10-CM | POA: Diagnosis not present

## 2021-02-28 DIAGNOSIS — M542 Cervicalgia: Secondary | ICD-10-CM | POA: Diagnosis not present

## 2021-03-05 DIAGNOSIS — M4322 Fusion of spine, cervical region: Secondary | ICD-10-CM | POA: Diagnosis not present

## 2021-03-05 DIAGNOSIS — M542 Cervicalgia: Secondary | ICD-10-CM | POA: Diagnosis not present

## 2021-03-14 DIAGNOSIS — M4322 Fusion of spine, cervical region: Secondary | ICD-10-CM | POA: Diagnosis not present

## 2021-03-14 DIAGNOSIS — M542 Cervicalgia: Secondary | ICD-10-CM | POA: Diagnosis not present

## 2021-03-21 DIAGNOSIS — M542 Cervicalgia: Secondary | ICD-10-CM | POA: Diagnosis not present

## 2021-03-21 DIAGNOSIS — M4322 Fusion of spine, cervical region: Secondary | ICD-10-CM | POA: Diagnosis not present

## 2021-03-26 DIAGNOSIS — M4322 Fusion of spine, cervical region: Secondary | ICD-10-CM | POA: Diagnosis not present

## 2021-03-26 DIAGNOSIS — M542 Cervicalgia: Secondary | ICD-10-CM | POA: Diagnosis not present

## 2021-03-28 DIAGNOSIS — S6992XA Unspecified injury of left wrist, hand and finger(s), initial encounter: Secondary | ICD-10-CM | POA: Diagnosis not present

## 2021-04-02 DIAGNOSIS — Z7689 Persons encountering health services in other specified circumstances: Secondary | ICD-10-CM | POA: Diagnosis not present

## 2021-04-02 DIAGNOSIS — M25512 Pain in left shoulder: Secondary | ICD-10-CM | POA: Diagnosis not present

## 2021-04-09 DIAGNOSIS — M25512 Pain in left shoulder: Secondary | ICD-10-CM | POA: Diagnosis not present

## 2021-04-09 DIAGNOSIS — Z1159 Encounter for screening for other viral diseases: Secondary | ICD-10-CM | POA: Diagnosis not present

## 2021-04-09 DIAGNOSIS — M7552 Bursitis of left shoulder: Secondary | ICD-10-CM | POA: Diagnosis not present

## 2021-04-11 DIAGNOSIS — M25512 Pain in left shoulder: Secondary | ICD-10-CM | POA: Diagnosis not present

## 2021-04-17 ENCOUNTER — Encounter: Payer: Self-pay | Admitting: Adult Health

## 2021-04-17 ENCOUNTER — Ambulatory Visit (INDEPENDENT_AMBULATORY_CARE_PROVIDER_SITE_OTHER): Payer: BC Managed Care – PPO | Admitting: Adult Health

## 2021-04-17 VITALS — Ht 70.0 in | Wt 260.0 lb

## 2021-04-17 DIAGNOSIS — G35 Multiple sclerosis: Secondary | ICD-10-CM

## 2021-04-17 NOTE — Progress Notes (Signed)
I have read the note, and I agree with the clinical assessment and plan.  Tamsen Reist K Lenix Kidd   

## 2021-04-17 NOTE — Progress Notes (Signed)
PATIENT: [name] DOB: 08/31/1960  REASON FOR VISIT: follow up HISTORY FROM: patient  HISTORY OF PRESENT ILLNESS: Today 04/17/21:  [name] is a 60 year old female with a history of relapsed remitting multiple sclerosis.  She returns today for follow-up.  At the last visit she was switched to [place].  Reports that she has tolerated it well.  Since her last visit she has had neck surgery.  She states since then she has noticed numbness in the right hand.  She reports that her neurosurgeon thinks it is carpal tunnel but nerve conduction studies have not been completed.  She also states that she has been having more shoulder pain and knee pain.  Reports that she has been diagnosed with bursitis.  Not sure of if Ocrevus has anything to do with this?  Patient denies any changes with her balance.  No changes with the bowels or bladder.  Patient continues to take long-acting Mirapex and short acting Mirapex 0.25 mg at bedtime.  She reports for the most part this works very well for her.  She states approximately once every 2 weeks she will have a restless night.  She returns today for an evaluation.  10/22/20 [name] is a 60 year old female with a history of relapsed remitting multiple sclerosis.  She returns today for follow-up.  She remains on Tysabri.  She had blood work prior to this visit.  We are still waiting on the JCV index.  The patient states overall she is remained stable.  She continues to have numbness in the hands left side greater than right.  She did have nerve conduction studies completed at [place] [facility] but has not received the results.  She also reports that she has a torn MCL in the left knee.  They have not surgically corrected this.  Patient feels that bowels and bladder have remained stable.  No significant changes in her vision.  She states that she may be having a flareup of uveitis in the left eye.  She has not seen her ophthalmologist recently.  She does have  prednisone drops that she can use when this happens.  04/24/20 [name] is a 60 year old female with a history of relapsed remitting multiple sclerosis.  She returns today for follow-up.  She remains on Tysabri.  She denies any new symptoms.  No new changes with the bowels or bladder.  No new weak numbness or weakness.  No changes with her gait or balance.  She does state that she changed to another job in March.  She has been under a lot of stress.  She states that this has resulted in some panic attacks.  She also noted some cognitive changes.  She reports the inability to concentrate and focus at times.  Also reports that she can be forgetful with things like where she placed her keys it.  She denies any significant changes at home.  Able to complete all ADLs independently.  Operates a motor vehicle without difficulty.  Manages her own finances without difficulty.  She returns today for an evaluation.  HISTORY 10/17/19:   [name] is a 60 year old female with a history of relapsed remitting multiple sclerosis.  She returns today for follow-up.  She continues on Tysabri.  Denies any new symptoms.  Denies any changes with her vision.  No changes with the bowels or bladder with the exception that she has diarrhea more often now.  No change in her diet.  She reports that she had a fall  in December.  Reports that she slipped on her back deck.  Reports that she injured her left knee but this is slowly improving.  Denies any new weakness.  Continues to have numbness down the left arm.  Has not followed up with Dr. Ronnald Ramp.  REVIEW OF SYSTEMS: Out of a complete 14 system review of symptoms, the patient complains only of the following symptoms, and all other reviewed systems are negative.  See HPI  ALLERGIES: Allergies  Allergen Reactions   Penicillins Rash    HOME MEDICATIONS: Outpatient Medications Prior to Visit  Medication Sig Dispense Refill   acetaminophen (TYLENOL) 500 MG tablet Take 1,000 mg by  mouth daily as needed.     ALPRAZolam (XANAX) 0.5 MG tablet Take 1 tablet 1 hour prior to MRI and another if needed when arrives at site. 2 tablet 0   Armodafinil 150 MG tablet TAKE 1 TABLET BY MOUTH EVERY DAY 90 tablet 3   atorvastatin (LIPITOR) 10 MG tablet 10 mg daily.     cyclobenzaprine (FLEXERIL) 10 MG tablet Take 10 mg by mouth every 8 (eight) hours as needed.     diclofenac (VOLTAREN) 75 MG EC tablet Take 75 mg by mouth 2 (two) times daily.     escitalopram (LEXAPRO) 10 MG tablet Take 10 mg by mouth daily.     ibuprofen (ADVIL,MOTRIN) 200 MG tablet Take 400 mg by mouth every 6 (six) hours as needed.     losartan (COZAAR) 50 MG tablet Take 1 tablet by mouth daily.     meloxicam (MOBIC) 7.5 MG tablet Take 7.5 mg by mouth 2 (two) times daily.     Ocrelizumab (OCREVUS IV) Inject into the vein every 6 (six) months. 1st dose 11-2020     pantoprazole (PROTONIX) 20 MG tablet 20 mg daily.     pramipexole (MIRAPEX) 0.25 MG tablet TAKE 1 TABLET AT BEDTIME 90 tablet 3   Pramipexole Dihydrochloride 0.375 MG TB24 TAKE 1 TABLET (0.375 MG TOTAL) BY MOUTH AT BEDTIME. 90 tablet 1   prednisoLONE acetate (PRED FORTE) 1 % ophthalmic suspension As needed     VENTOLIN HFA 108 (90 Base) MCG/ACT inhaler      No facility-administered medications prior to visit.    PAST MEDICAL HISTORY: Past Medical History:  Diagnosis Date   Asthma    Back pain    Cardiac/pericardial tamponade 06/11/2014   s/p pericardial window 06/16/2014 per Dr. Darcey Nora   Depression    Hypertension    MS (multiple sclerosis) (HCC)    Muscle pain    Obesity    Periodic limb movement disorder (PLMD) 07/15/2016   Pleural effusion, bilateral 06/30/2014   Uveitis    Bilateral    PAST SURGICAL HISTORY: Past Surgical History:  Procedure Laterality Date   CERVICAL FUSION     01-02-21, [place] [facility] C5-7   microdisectomy  2019   lumbar spine   PERICARDIAL TAP N/A 06/11/2014   Procedure: PERICARDIAL TAP;  Surgeon: Blane Ohara, MD;   Location[facility] C[facility] LAB;  Service: Cardiovascular;  Laterality: N/A;   SUBXYPHOID PERICARDIAL WINDOW N/A 06/16/2014   Procedure: SUBXYPHOID PERICARDIAL WINDOW;  Surgeon: Ivin Poot, MD;  Location: [facility];  Service: Thoracic;  Laterality: N/A;   TEE WITHOUT CARDIOVERSION N/A 06/16/2014   Procedure: TRANSESOPHAGEAL ECHOCARDIOGRAM (TEE);  Surgeon: Ivin Poot, MD;  Location: [place] [facility];  Service: Thoracic;  Laterality: N/A;   uterine ablasion  2012    FAMILY HISTORY: Family History  Problem Relation Age of Onset  Breast cancer Mother    Hypertension Mother    Diabetes Mother    Heart attack Father    Epilepsy Father    Endocrine tumor Brother     SOCIAL HISTORY: Social History   Socioeconomic History   Marital status: Single    Spouse name: Not on file   Number of children: 0   Years of education: MA   Highest education level: Not on file  Occupational History   Occupation: [employer] for [employer]  Tobacco Use   Smoking status: Never   Smokeless tobacco: Never  Substance and Sexual Activity   Alcohol use: Yes    Alcohol/week: 0.0 standard drinks    Comment: occasional   Drug use: No    Comment: marijuana   Sexual activity: Not Currently  Other Topics Concern   Not on file  Social History Narrative   Patient lives at home with her wife [name])    Patient works full time.   Arboriculturist.   Right handed.   Caffeine two cups daily.   Social Determinants of Health   Financial Resource Strain: Not on file  Food Insecurity: Not on file  Transportation Needs: Not on file  Physical Activity: Not on file  Stress: Not on file  Social Connections: Not on file  Intimate Partner Violence: Not on file      PHYSICAL EXAM  Vitals:   04/17/21 0831  Weight: 260 lb (117.9 kg)  Height: '5\' 10"'$  (1.778 m)   Body mass index is 37.31 kg/m.  Generalized: Well developed, in no acute distress   Neurological examination  Mentation: Alert oriented to time, place,  history taking. Follows all commands speech and language fluent Cranial nerve II-XII: Pupils were equal round reactive to light. Extraocular movements were full, visual field were full on confrontational test. Head turning and shoulder shrug  were normal and symmetric. Motor: The motor testing reveals 5 over 5 strength of all 4 extremities. Good symmetric motor tone is noted throughout.  Sensory: Sensory testing is intact to soft touch on all 4 extremities. No evidence of extinction is noted.  Coordination: Cerebellar testing reveals good finger-nose-finger and heel-to-shin bilaterally.  Gait and station: Gait is normal.  Reflexes: Deep tendon reflexes are symmetric and normal bilaterally.   DIAGNOSTIC DATA (LABS, IMAGING, TESTING) - I reviewed patient records, labs, notes, testing and imaging myself where available.  Lab Results  Component Value Date   WBC 7.5 10/16/2020   HGB 13.2 10/16/2020   HCT 39.7 10/16/2020   MCV 89 10/16/2020   PLT 178 10/16/2020      Component Value Date/Time   NA 141 10/16/2020 0855   K 4.1 10/16/2020 0855   CL 104 10/16/2020 0855   CO2 21 10/16/2020 0855   GLUCOSE 83 10/16/2020 0855   GLUCOSE 84 07/10/2014 0944   BUN 19 10/16/2020 0855   CREATININE 0.80 10/16/2020 0855   CREATININE 0.76 06/27/2014 0915   CALCIUM 9.1 10/16/2020 0855   PROT 6.6 10/16/2020 0855   ALBUMIN 4.0 10/16/2020 0855   AST 15 10/16/2020 0855   ALT 15 10/16/2020 0855   ALKPHOS 99 10/16/2020 0855   BILITOT 0.3 10/16/2020 0855   GFRNONAA 86 04/24/2020 0915   GFRAA 99 04/24/2020 0915       ASSESSMENT AND PLAN 60 y.o. year old female  has a past medical history of Asthma, Back pain, Cardiac/pericardial tamponade (06/11/2014), Depression, Hypertension, MS (multiple sclerosis) ([place]), Muscle pain, Obesity, Periodic limb movement disorder (PLMD) (07/15/2016), Pleural  effusion, bilateral (06/30/2014), and Uveitis. here with:  1.  Multiple sclerosis  Continue Ocrevus  Blood work  today   2.  Restless leg  Continue Mirapex long-acting 0.375 mg at bedtime Continue Mirapex short acting 0.25 mg at bedtime.  Patient reports that at least once every 2 weeks she will have a restless night as she is unable to sleep due to restless legs.  Advised that she could take an extra 0.25 mg tablet on those nights.  She will follow-up in 3-4 months with Dr. Einar Gip, MSN, NP-C 04/17/2021, 8:46 AM [facility] [address], [place],  [address] [phone]

## 2021-04-18 ENCOUNTER — Encounter: Payer: Self-pay | Admitting: Adult Health

## 2021-04-18 DIAGNOSIS — M25512 Pain in left shoulder: Secondary | ICD-10-CM | POA: Diagnosis not present

## 2021-04-18 LAB — CBC WITH DIFFERENTIAL/PLATELET
Basophils Absolute: 0 10*3/uL (ref 0.0–0.2)
Basos: 0 %
EOS (ABSOLUTE): 0 10*3/uL (ref 0.0–0.4)
Eos: 0 %
Hematocrit: 40.4 % (ref 34.0–46.6)
Hemoglobin: 13.7 g/dL (ref 11.1–15.9)
Immature Grans (Abs): 0 10*3/uL (ref 0.0–0.1)
Immature Granulocytes: 0 %
Lymphocytes Absolute: 1.3 10*3/uL (ref 0.7–3.1)
Lymphs: 23 %
MCH: 29.3 pg (ref 26.6–33.0)
MCHC: 33.9 g/dL (ref 31.5–35.7)
MCV: 86 fL (ref 79–97)
Monocytes Absolute: 0.5 10*3/uL (ref 0.1–0.9)
Monocytes: 9 %
Neutrophils Absolute: 3.7 10*3/uL (ref 1.4–7.0)
Neutrophils: 68 %
Platelets: 248 10*3/uL (ref 150–450)
RBC: 4.68 x10E6/uL (ref 3.77–5.28)
RDW: 12.9 % (ref 11.7–15.4)
WBC: 5.5 10*3/uL (ref 3.4–10.8)

## 2021-04-18 LAB — COMPREHENSIVE METABOLIC PANEL
ALT: 15 IU/L (ref 0–32)
AST: 12 IU/L (ref 0–40)
Albumin/Globulin Ratio: 1.5 (ref 1.2–2.2)
Albumin: 4.3 g/dL (ref 3.8–4.9)
Alkaline Phosphatase: 105 IU/L (ref 44–121)
BUN/Creatinine Ratio: 15 (ref 9–23)
BUN: 15 mg/dL (ref 6–24)
Bilirubin Total: 0.5 mg/dL (ref 0.0–1.2)
CO2: 25 mmol/L (ref 20–29)
Calcium: 9.5 mg/dL (ref 8.7–10.2)
Chloride: 100 mmol/L (ref 96–106)
Creatinine, Ser: 0.98 mg/dL (ref 0.57–1.00)
Globulin, Total: 2.8 g/dL (ref 1.5–4.5)
Glucose: 82 mg/dL (ref 65–99)
Potassium: 5 mmol/L (ref 3.5–5.2)
Sodium: 139 mmol/L (ref 134–144)
Total Protein: 7.1 g/dL (ref 6.0–8.5)
eGFR: 67 mL/min/{1.73_m2} (ref >59)

## 2021-04-18 LAB — IGG, IGA, IGM
IgA/Immunoglobulin A, Serum: 192 mg/dL (ref 87–352)
IgG (Immunoglobin G), Serum: 1392 mg/dL (ref 586–1602)
IgM (Immunoglobulin M), Srm: 81 mg/dL (ref 26–217)

## 2021-04-22 ENCOUNTER — Ambulatory Visit: Payer: BC Managed Care – PPO | Admitting: Neurology

## 2021-05-01 DIAGNOSIS — U071 COVID-19: Secondary | ICD-10-CM | POA: Diagnosis not present

## 2021-05-01 DIAGNOSIS — D849 Immunodeficiency, unspecified: Secondary | ICD-10-CM | POA: Diagnosis not present

## 2021-05-01 DIAGNOSIS — G35 Multiple sclerosis: Secondary | ICD-10-CM | POA: Diagnosis not present

## 2021-05-09 DIAGNOSIS — M25512 Pain in left shoulder: Secondary | ICD-10-CM | POA: Diagnosis not present

## 2021-05-14 ENCOUNTER — Telehealth: Payer: Self-pay | Admitting: *Deleted

## 2021-05-14 DIAGNOSIS — Z6838 Body mass index (BMI) 38.0-38.9, adult: Secondary | ICD-10-CM | POA: Diagnosis not present

## 2021-05-14 DIAGNOSIS — I10 Essential (primary) hypertension: Secondary | ICD-10-CM | POA: Diagnosis not present

## 2021-05-14 DIAGNOSIS — G5603 Carpal tunnel syndrome, bilateral upper limbs: Secondary | ICD-10-CM | POA: Diagnosis not present

## 2021-05-14 DIAGNOSIS — M542 Cervicalgia: Secondary | ICD-10-CM | POA: Diagnosis not present

## 2021-05-14 NOTE — Telephone Encounter (Signed)
Tysabri 6 month discontinuation questionnaire completed, signed by Dr Jannifer Franklin. Faxed to [facility], received confirmation.

## 2021-05-16 DIAGNOSIS — M25512 Pain in left shoulder: Secondary | ICD-10-CM | POA: Diagnosis not present

## 2021-05-20 DIAGNOSIS — M25512 Pain in left shoulder: Secondary | ICD-10-CM | POA: Diagnosis not present

## 2021-05-22 DIAGNOSIS — M25512 Pain in left shoulder: Secondary | ICD-10-CM | POA: Diagnosis not present

## 2021-05-28 DIAGNOSIS — M25512 Pain in left shoulder: Secondary | ICD-10-CM | POA: Diagnosis not present

## 2021-06-04 DIAGNOSIS — M25512 Pain in left shoulder: Secondary | ICD-10-CM | POA: Diagnosis not present

## 2021-06-06 DIAGNOSIS — M25512 Pain in left shoulder: Secondary | ICD-10-CM | POA: Diagnosis not present

## 2021-06-11 DIAGNOSIS — M25512 Pain in left shoulder: Secondary | ICD-10-CM | POA: Diagnosis not present

## 2021-06-13 ENCOUNTER — Other Ambulatory Visit: Payer: Self-pay | Admitting: Adult Health

## 2021-06-13 DIAGNOSIS — M25512 Pain in left shoulder: Secondary | ICD-10-CM | POA: Diagnosis not present

## 2021-07-02 DIAGNOSIS — G35 Multiple sclerosis: Secondary | ICD-10-CM | POA: Diagnosis not present

## 2021-07-06 ENCOUNTER — Other Ambulatory Visit: Payer: Self-pay | Admitting: Adult Health

## 2021-07-23 DIAGNOSIS — M542 Cervicalgia: Secondary | ICD-10-CM | POA: Diagnosis not present

## 2021-07-23 DIAGNOSIS — G5603 Carpal tunnel syndrome, bilateral upper limbs: Secondary | ICD-10-CM | POA: Diagnosis not present

## 2021-07-23 DIAGNOSIS — Z6841 Body Mass Index (BMI) 40.0 and over, adult: Secondary | ICD-10-CM | POA: Diagnosis not present

## 2021-07-23 DIAGNOSIS — I10 Essential (primary) hypertension: Secondary | ICD-10-CM | POA: Diagnosis not present

## 2021-07-25 DIAGNOSIS — E785 Hyperlipidemia, unspecified: Secondary | ICD-10-CM | POA: Diagnosis not present

## 2021-07-25 DIAGNOSIS — Z0000 Encounter for general adult medical examination without abnormal findings: Secondary | ICD-10-CM | POA: Diagnosis not present

## 2021-07-30 ENCOUNTER — Other Ambulatory Visit: Payer: Self-pay | Admitting: Internal Medicine

## 2021-07-30 DIAGNOSIS — E2839 Other primary ovarian failure: Secondary | ICD-10-CM

## 2021-08-01 DIAGNOSIS — G5601 Carpal tunnel syndrome, right upper limb: Secondary | ICD-10-CM | POA: Diagnosis not present

## 2021-08-15 DIAGNOSIS — Z6841 Body Mass Index (BMI) 40.0 and over, adult: Secondary | ICD-10-CM | POA: Diagnosis not present

## 2021-08-15 DIAGNOSIS — M25512 Pain in left shoulder: Secondary | ICD-10-CM | POA: Diagnosis not present

## 2021-08-15 DIAGNOSIS — G5601 Carpal tunnel syndrome, right upper limb: Secondary | ICD-10-CM | POA: Diagnosis not present

## 2021-08-15 DIAGNOSIS — I10 Essential (primary) hypertension: Secondary | ICD-10-CM | POA: Diagnosis not present

## 2021-08-15 DIAGNOSIS — M542 Cervicalgia: Secondary | ICD-10-CM | POA: Diagnosis not present

## 2021-08-21 ENCOUNTER — Other Ambulatory Visit: Payer: Self-pay | Admitting: Adult Health

## 2021-08-29 DIAGNOSIS — M4722 Other spondylosis with radiculopathy, cervical region: Secondary | ICD-10-CM | POA: Diagnosis not present

## 2021-08-29 DIAGNOSIS — I10 Essential (primary) hypertension: Secondary | ICD-10-CM | POA: Diagnosis not present

## 2021-08-29 DIAGNOSIS — F3341 Major depressive disorder, recurrent, in partial remission: Secondary | ICD-10-CM | POA: Diagnosis not present

## 2021-09-10 ENCOUNTER — Encounter: Payer: Self-pay | Admitting: Adult Health

## 2021-09-10 MED ORDER — PRAMIPEXOLE DIHYDROCHLORIDE 0.25 MG PO TABS: 0.2500 mg | ORAL_TABLET | Freq: Two times a day (BID) | ORAL | 3 refills | Status: DC | PRN

## 2021-09-19 ENCOUNTER — Other Ambulatory Visit: Payer: Self-pay | Admitting: Internal Medicine

## 2021-09-19 DIAGNOSIS — Z1231 Encounter for screening mammogram for malignant neoplasm of breast: Secondary | ICD-10-CM

## 2021-10-08 ENCOUNTER — Ambulatory Visit: Payer: BC Managed Care – PPO

## 2021-10-16 ENCOUNTER — Ambulatory Visit
Admission: RE | Admit: 2021-10-16 | Discharge: 2021-10-16 | Disposition: A | Discharge status: Home / Self Care | Payer: BC Managed Care – PPO | Source: Ambulatory Visit | Attending: Internal Medicine | Admitting: Internal Medicine

## 2021-10-16 DIAGNOSIS — Z1231 Encounter for screening mammogram for malignant neoplasm of breast: Secondary | ICD-10-CM | POA: Diagnosis not present

## 2021-10-25 ENCOUNTER — Other Ambulatory Visit: Payer: Self-pay | Admitting: Adult Health

## 2021-10-28 ENCOUNTER — Ambulatory Visit: Payer: BC Managed Care – PPO | Admitting: Neurology

## 2021-10-28 ENCOUNTER — Encounter: Payer: Self-pay | Admitting: Neurology

## 2021-10-28 VITALS — BP 148/79 | HR 65 | Ht 70.0 in | Wt 277.5 lb

## 2021-10-28 DIAGNOSIS — G4761 Periodic limb movement disorder: Secondary | ICD-10-CM | POA: Diagnosis not present

## 2021-10-28 DIAGNOSIS — N3941 Urge incontinence: Secondary | ICD-10-CM | POA: Diagnosis not present

## 2021-10-28 DIAGNOSIS — Z79899 Other long term (current) drug therapy: Secondary | ICD-10-CM | POA: Diagnosis not present

## 2021-10-28 DIAGNOSIS — G35 Multiple sclerosis: Secondary | ICD-10-CM | POA: Diagnosis not present

## 2021-10-28 DIAGNOSIS — M5412 Radiculopathy, cervical region: Secondary | ICD-10-CM | POA: Insufficient documentation

## 2021-10-28 MED ORDER — PRAMIPEXOLE DIHYDROCHLORIDE 0.5 MG PO TABS: 0.2500 mg | ORAL_TABLET | Freq: Two times a day (BID) | ORAL | 3 refills | Status: DC | PRN

## 2021-10-28 MED ORDER — PRAMIPEXOLE DIHYDROCHLORIDE ER 0.375 MG PO TB24: ORAL_TABLET | ORAL | 3 refills | Status: DC

## 2021-10-28 MED ORDER — OXYBUTYNIN CHLORIDE ER 10 MG PO TB24: 10.0000 mg | ORAL_TABLET | Freq: Every day | ORAL | 3 refills | Status: DC

## 2021-10-28 NOTE — Progress Notes (Signed)
? ?[facility] ? ?PATIENT: [name] ?DOB: [birth year] ? ?REFERRING DOCTOR OR PCP:  Dr. Fara Olden ?SOURCE: Patient, note from Dr. Jannifer Franklin and Ward Givens, NP, laboratory and imaging reports, MRI images personally reviewed. ? ?_________________________________ ? ? ?HISTORICAL ? ?CHIEF COMPLAINT:  ?Chief Complaint  ?Patient presents with  ? Follow-up  ?  Rm 2, alone. Here for  6 month f/u for MS, on Ocrevus and tolerating well. Last infusion date: 07/02/2021 and Next infusion date: 12/31/2021. Pt continues to have spacticity in legs at night. 2 weeks after infusion she tends to feel sick for a couple weeks. Has had some rash appear and tx w/ OTC histamine or cortisone cream.   ? ? ?HISTORY OF PRESENT ILLNESS:  ?[name] presents for new patient, [name], at the [place] [facility] at [facility] for neurologic consultation. ? ?She is a 61 year old woman who was diagnosed with MS in 2000.  She is on Ocrevus, she has a 1-2 week period of flu-like reaction a couple weeks after the infusion.   MS has been stable and she has had no exacerbation.   Her last exacerbation was in 2014 before the switch to Tysabri. ? ?She is walking without an aide.    She tires out easily.   She feels she could walk a mile without a break.   Her left side is weaker than her right and she has some left leg spasticity.   She has urinary urgency and occasional urge incontinence.   She has not been placed on any medication for this.  She has had chronic right uveitis.     ? ?She sleeps poorly and notes restless legs and just general restlessness.   She is taking Mirapex XR once a day and additional dose at bedtime.  She still wakes up with the restless sensation but since the nighttime dose was increased, she has done better.    She still notes left leg spasticity.      ? ?She had cervical ACDF 12/2019 C5-C7.     She continues to have left neck pain and pain down the left arm.   Pain will go down to the elbow but not  to the hand.    ? ?MS History: ?She was diagnosed in 2000 after presenting with unilateral numbness.   She ws started on Avonex. .   In retrospect, she also had left optic neuritis in 1996.     She switched from Avonex to Rebif due to breakthrough activity.   She was switched to Tysabri due to breakthrough activity.   She became JCV Ab positive and switched to [place] in 2022.      ? ? ?MRI Brain 05/09/2020 shows   Multiple T2/FLAIR hyperintense foci in the cerebellum, brainstem, thalamus and hemispheres in a pattern and configuration consistent with chronic demyelinating plaque associated with multiple sclerosis. None of the foci appears to be acute.  No changes compared to 06/28/2019 ? 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     Potential for right C4, right C5, bilateral C6 and left C7 nerve root compression.   ? ?REVIEW OF SYSTEMS: ?Constitutional: No fevers, chills, sweats, or change in appetite ?Eyes: No visual changes, double vision,  eye pain ?Ear, nose and throat: No hearing loss, ear pain, nasal congestion, sore throat ?Cardiovascular: No chest pain, palpitations ?Respiratory:  No shortness of breath at rest or with exertion.   No wheezes ?GastrointestinaI: No nausea, vomiting, diarrhea, abdominal pain, fecal incontinence ?Genitourinary:  No dysuria, urinary retention or frequency.  No nocturia. ?Musculoskeletal:  No neck pain, back pain ?Integumentary: No rash, pruritus, skin lesions ?Neurological: as above ?Psychiatric: No depression at this time.  No anxiety ?Endocrine: No palpitations, diaphoresis, change in appetite, change in weigh or increased thirst ?Hematologic/Lymphatic:  No anemia, purpura,  petechiae. ?Allergic/Immunologic: No itchy/runny eyes, nasal congestion, recent allergic reactions, rashes ? ?ALLERGIES: ?Allergies  ?Allergen Reactions  ? Penicillins Rash  ? ? ?HOME MEDICATIONS: ? ?Current Outpatient Medications:  ?  acetaminophen (TYLENOL) 500 MG tablet, Take 1,000 mg by mouth daily as needed., Disp: , Rfl:  ?  ALPRAZolam (XANAX) 0.5 MG tablet, Take 1 tablet 1 hour prior to MRI and another if needed when arrives at site., Disp: 2 tablet, Rfl: 0 ?  Armodafinil 150 MG tablet, TAKE 1 TABLET BY MOUTH EVERY DAY, Disp: 90 tablet, Rfl: 3 ?  atorvastatin (LIPITOR) 10 MG tablet, 10 mg daily., Disp: , Rfl:  ?  cyclobenzaprine (FLEXERIL) 10 MG tablet, Take 10 mg by mouth every 8 (eight) hours as needed., Disp: , Rfl:  ?  escitalopram (LEXAPRO) 10 MG tablet, Take 10 mg by mouth daily., Disp: , Rfl:  ?  ibuprofen (ADVIL,MOTRIN) 200 MG tablet, Take 400 mg by mouth every 6 (six) hours as needed., Disp: , Rfl:  ?  losartan (COZAAR) 50 MG tablet, Take 1 tablet by mouth daily., Disp: , Rfl:  ?  meloxicam (MOBIC) 7.5 MG tablet, Take 7.5 mg by mouth 2 (two) times daily., Disp: , Rfl:  ?  Ocrelizumab (OCREVUS IV), Inject into the vein every 6 (six) months. 1st dose 11-2020, Disp: , Rfl:  ?  oxybutynin (DITROPAN XL) 10 MG 24 hr tablet, Take 1 tablet (10 mg total) by mouth at bedtime., Disp: 90 tablet, Rfl: 3 ?  pantoprazole (PROTONIX) 20 MG tablet, 20 mg daily., Disp: , Rfl:  ?  prednisoLONE acetate (PRED FORTE) 1 % ophthalmic suspension, As needed, Disp: , Rfl:  ?  VENTOLIN HFA 108 (90 Base) MCG/ACT inhaler, , Disp: , Rfl:  ?  pramipexole (MIRAPEX) 0.5 MG tablet, Take 0.5 tablets (0.25 mg total) by mouth 2 (two) times daily as needed., Disp: 90 tablet, Rfl: 3 ?  Pramipexole Dihydrochloride 0.375 MG TB24, TAKE 1 TABLET (0.375 MG TOTAL) BY MOUTH AT BEDTIME., Disp: 90 tablet, Rfl: 3 ? ?PAST MEDICAL HISTORY: ?Past Medical History:  ?Diagnosis Date  ? Asthma   ? Back pain   ? Cardiac/pericardial tamponade 06/11/2014  ? s/p  pericardial window 06/16/2014 per Dr. Darcey Nora  ? Depression   ? Hypertension   ? MS (multiple sclerosis) ([place])   ? Muscle pain   ? Obesity   ? Periodic limb movement disorder (PLMD) 07/15/2016  ? Pleural effusion, bilateral 06/30/2014  ? Uveitis   ? Bilateral  ? ? ?PAST SURGICAL HISTORY: ?Past Surgical History:  ?Procedure Laterality Date  ? CERVICAL FUSION    ? 01-02-21, [place] [facility] C5-7  ? microdisectomy  2019  ? lumbar spine  ? PERICARDIAL TAP N/A 06/11/2014  ? Procedure: PERICARDIAL TAP;  Surgeon: Blane Ohara, MD;  Location: [facility] CATH LAB;  Service: Cardiovascular;  Laterality: N/A;  ? SUBXYPHOID PERICARDIAL WINDOW N/A 06/16/2014  ? Procedure: SUBXYPHOID PERICARDIAL WINDOW;  Surgeon:  Ivin Poot, MD;  Location: [place];  Service: Thoracic;  Laterality: N/A;  ? TEE WITHOUT CARDIOVERSION N/A 06/16/2014  ? Procedure: TRANSESOPHAGEAL ECHOCARDIOGRAM (TEE);  Surgeon: Ivin Poot, MD;  Location: [facility];  Service: Thoracic;  Laterality: N/A;  ? uterine ablasion  2012  ? ? ?FAMILY HISTORY: ?Family History  ?Problem Relation Age of Onset  ? Breast cancer Mother   ? Hypertension Mother   ? Diabetes Mother   ? Heart attack Father   ? Epilepsy Father   ? Endocrine tumor Brother   ? ? ?SOCIAL HISTORY: ? ?Social History  ? ?Socioeconomic History  ? Marital status: Married  ?  Spouse name: Not on file  ? Number of children: 0  ? Years of education: MA  ? Highest education level: Not on file  ?Occupational History  ? Occupation: Soil scientist for [employer]  ?Tobacco Use  ? Smoking status: Never  ? Smokeless tobacco: Never  ?Substance and Sexual Activity  ? Alcohol use: Yes  ?  Alcohol/week: 0.0 standard drinks  ?  Comment: occasional  ? Drug use: No  ?  Comment: marijuana  ? Sexual activity: Not Currently  ?Other Topics Concern  ? Not on file  ?Social History Narrative  ? Patient lives at home with her wife [name])   ? 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  ? Caffeine two cups daily.  ? ?Social  Determinants of Health  ? ?Financial Resource Strain: Not on file  ?Food Insecurity: Not on file  ?Transportation Needs: Not on file  ?Physical Activity: Not on file  ?Stress: Not on file  ?Social Connections: Not

## 2021-10-29 ENCOUNTER — Other Ambulatory Visit: Payer: Self-pay | Admitting: Neurology

## 2021-11-05 ENCOUNTER — Other Ambulatory Visit: Payer: Self-pay | Admitting: *Deleted

## 2021-11-05 MED ORDER — PRAMIPEXOLE DIHYDROCHLORIDE 0.25 MG PO TABS
0.2500 mg | ORAL_TABLET | Freq: Two times a day (BID) | ORAL | 3 refills | Status: DC | PRN
Start: 2021-11-05 — End: 2022-08-28

## 2021-11-06 ENCOUNTER — Telehealth: Payer: Self-pay | Admitting: Neurology

## 2021-11-06 NOTE — Telephone Encounter (Signed)
[employer] pending fax notes to [employer]  ?

## 2021-11-07 DIAGNOSIS — M545 Low back pain, unspecified: Secondary | ICD-10-CM | POA: Diagnosis not present

## 2021-11-11 NOTE — Telephone Encounter (Signed)
LVM for pt to call back to schedule  ?[employer]BS Josem Kaufmann: [phone] (exp. 11/06/21 to 02/06/22)  ?

## 2021-11-13 ENCOUNTER — Other Ambulatory Visit: Payer: Self-pay | Admitting: *Deleted

## 2021-11-13 MED ORDER — ALPRAZOLAM 0.5 MG PO TABS: ORAL_TABLET | ORAL | 0 refills | Status: DC

## 2021-11-13 NOTE — Addendum Note (Signed)
Addended by: Arlice Colt A on: 11/13/2021 12:13 PM ? ? Modules accepted: Orders ? ?

## 2021-11-13 NOTE — Telephone Encounter (Signed)
Patient returned my call she is scheduled at [facility] for 11/19/21.. ? ?She also informed me she has restless legs and would like something to help relax her, she is aware to have a driver.  ?

## 2021-11-13 NOTE — Addendum Note (Signed)
Addended by: Darleen Crocker on: 11/13/2021 10:38 AM ? ? Modules accepted: Orders ? ?

## 2021-11-14 DIAGNOSIS — M542 Cervicalgia: Secondary | ICD-10-CM | POA: Diagnosis not present

## 2021-11-15 DIAGNOSIS — M2569 Stiffness of other specified joint, not elsewhere classified: Secondary | ICD-10-CM | POA: Diagnosis not present

## 2021-11-15 DIAGNOSIS — M6281 Muscle weakness (generalized): Secondary | ICD-10-CM | POA: Diagnosis not present

## 2021-11-15 DIAGNOSIS — M545 Low back pain, unspecified: Secondary | ICD-10-CM | POA: Diagnosis not present

## 2021-11-18 DIAGNOSIS — M6281 Muscle weakness (generalized): Secondary | ICD-10-CM | POA: Diagnosis not present

## 2021-11-18 DIAGNOSIS — M2569 Stiffness of other specified joint, not elsewhere classified: Secondary | ICD-10-CM | POA: Diagnosis not present

## 2021-11-18 DIAGNOSIS — M545 Low back pain, unspecified: Secondary | ICD-10-CM | POA: Diagnosis not present

## 2021-11-19 ENCOUNTER — Ambulatory Visit: Payer: BC Managed Care – PPO

## 2021-11-19 DIAGNOSIS — G35 Multiple sclerosis: Secondary | ICD-10-CM | POA: Diagnosis not present

## 2021-11-22 DIAGNOSIS — M2569 Stiffness of other specified joint, not elsewhere classified: Secondary | ICD-10-CM | POA: Diagnosis not present

## 2021-11-22 DIAGNOSIS — M545 Low back pain, unspecified: Secondary | ICD-10-CM | POA: Diagnosis not present

## 2021-11-22 DIAGNOSIS — M6281 Muscle weakness (generalized): Secondary | ICD-10-CM | POA: Diagnosis not present

## 2021-12-12 DIAGNOSIS — M6281 Muscle weakness (generalized): Secondary | ICD-10-CM | POA: Diagnosis not present

## 2021-12-12 DIAGNOSIS — M545 Low back pain, unspecified: Secondary | ICD-10-CM | POA: Diagnosis not present

## 2021-12-12 DIAGNOSIS — M2569 Stiffness of other specified joint, not elsewhere classified: Secondary | ICD-10-CM | POA: Diagnosis not present

## 2021-12-13 DIAGNOSIS — M25512 Pain in left shoulder: Secondary | ICD-10-CM | POA: Diagnosis not present

## 2021-12-16 DIAGNOSIS — M545 Low back pain, unspecified: Secondary | ICD-10-CM | POA: Diagnosis not present

## 2021-12-16 DIAGNOSIS — M2569 Stiffness of other specified joint, not elsewhere classified: Secondary | ICD-10-CM | POA: Diagnosis not present

## 2021-12-16 DIAGNOSIS — M6281 Muscle weakness (generalized): Secondary | ICD-10-CM | POA: Diagnosis not present

## 2021-12-19 DIAGNOSIS — M2569 Stiffness of other specified joint, not elsewhere classified: Secondary | ICD-10-CM | POA: Diagnosis not present

## 2021-12-19 DIAGNOSIS — M545 Low back pain, unspecified: Secondary | ICD-10-CM | POA: Diagnosis not present

## 2021-12-19 DIAGNOSIS — M6281 Muscle weakness (generalized): Secondary | ICD-10-CM | POA: Diagnosis not present

## 2021-12-23 DIAGNOSIS — M6281 Muscle weakness (generalized): Secondary | ICD-10-CM | POA: Diagnosis not present

## 2021-12-23 DIAGNOSIS — M2569 Stiffness of other specified joint, not elsewhere classified: Secondary | ICD-10-CM | POA: Diagnosis not present

## 2021-12-23 DIAGNOSIS — M545 Low back pain, unspecified: Secondary | ICD-10-CM | POA: Diagnosis not present

## 2021-12-24 ENCOUNTER — Telehealth: Payer: Self-pay | Admitting: *Deleted

## 2021-12-24 NOTE — Telephone Encounter (Signed)
Faxed completed/signed surgical clearance form back to [facility] at [phone] re: L shoulder scope, rotator cuff repair. Pt optimized for surgery neurology. Received fax confirmation. ?

## 2021-12-25 DIAGNOSIS — M6281 Muscle weakness (generalized): Secondary | ICD-10-CM | POA: Diagnosis not present

## 2021-12-25 DIAGNOSIS — M545 Low back pain, unspecified: Secondary | ICD-10-CM | POA: Diagnosis not present

## 2021-12-25 DIAGNOSIS — M2569 Stiffness of other specified joint, not elsewhere classified: Secondary | ICD-10-CM | POA: Diagnosis not present

## 2022-01-01 DIAGNOSIS — M2569 Stiffness of other specified joint, not elsewhere classified: Secondary | ICD-10-CM | POA: Diagnosis not present

## 2022-01-01 DIAGNOSIS — M545 Low back pain, unspecified: Secondary | ICD-10-CM | POA: Diagnosis not present

## 2022-01-01 DIAGNOSIS — M6281 Muscle weakness (generalized): Secondary | ICD-10-CM | POA: Diagnosis not present

## 2022-01-02 DIAGNOSIS — G35 Multiple sclerosis: Secondary | ICD-10-CM | POA: Diagnosis not present

## 2022-01-06 DIAGNOSIS — M2569 Stiffness of other specified joint, not elsewhere classified: Secondary | ICD-10-CM | POA: Diagnosis not present

## 2022-01-06 DIAGNOSIS — M545 Low back pain, unspecified: Secondary | ICD-10-CM | POA: Diagnosis not present

## 2022-01-06 DIAGNOSIS — M6281 Muscle weakness (generalized): Secondary | ICD-10-CM | POA: Diagnosis not present

## 2022-01-08 DIAGNOSIS — M6281 Muscle weakness (generalized): Secondary | ICD-10-CM | POA: Diagnosis not present

## 2022-01-08 DIAGNOSIS — M2569 Stiffness of other specified joint, not elsewhere classified: Secondary | ICD-10-CM | POA: Diagnosis not present

## 2022-01-08 DIAGNOSIS — M545 Low back pain, unspecified: Secondary | ICD-10-CM | POA: Diagnosis not present

## 2022-01-15 DIAGNOSIS — S46012A Strain of muscle(s) and tendon(s) of the rotator cuff of left shoulder, initial encounter: Secondary | ICD-10-CM | POA: Diagnosis not present

## 2022-01-15 DIAGNOSIS — M75112 Incomplete rotator cuff tear or rupture of left shoulder, not specified as traumatic: Secondary | ICD-10-CM | POA: Diagnosis not present

## 2022-01-15 DIAGNOSIS — M24112 Other articular cartilage disorders, left shoulder: Secondary | ICD-10-CM | POA: Diagnosis not present

## 2022-01-15 DIAGNOSIS — M7552 Bursitis of left shoulder: Secondary | ICD-10-CM | POA: Diagnosis not present

## 2022-01-15 DIAGNOSIS — M19012 Primary osteoarthritis, left shoulder: Secondary | ICD-10-CM | POA: Diagnosis not present

## 2022-01-15 DIAGNOSIS — M7542 Impingement syndrome of left shoulder: Secondary | ICD-10-CM | POA: Diagnosis not present

## 2022-01-15 DIAGNOSIS — M7522 Bicipital tendinitis, left shoulder: Secondary | ICD-10-CM | POA: Diagnosis not present

## 2022-01-15 DIAGNOSIS — G8918 Other acute postprocedural pain: Secondary | ICD-10-CM | POA: Diagnosis not present

## 2022-01-20 DIAGNOSIS — M25512 Pain in left shoulder: Secondary | ICD-10-CM | POA: Diagnosis not present

## 2022-01-20 DIAGNOSIS — M6281 Muscle weakness (generalized): Secondary | ICD-10-CM | POA: Diagnosis not present

## 2022-01-20 DIAGNOSIS — M25612 Stiffness of left shoulder, not elsewhere classified: Secondary | ICD-10-CM | POA: Diagnosis not present

## 2022-01-22 DIAGNOSIS — M25512 Pain in left shoulder: Secondary | ICD-10-CM | POA: Diagnosis not present

## 2022-01-22 DIAGNOSIS — M25612 Stiffness of left shoulder, not elsewhere classified: Secondary | ICD-10-CM | POA: Diagnosis not present

## 2022-01-22 DIAGNOSIS — M6281 Muscle weakness (generalized): Secondary | ICD-10-CM | POA: Diagnosis not present

## 2022-01-24 DIAGNOSIS — M19012 Primary osteoarthritis, left shoulder: Secondary | ICD-10-CM | POA: Diagnosis not present

## 2022-01-27 DIAGNOSIS — M6281 Muscle weakness (generalized): Secondary | ICD-10-CM | POA: Diagnosis not present

## 2022-01-27 DIAGNOSIS — M25612 Stiffness of left shoulder, not elsewhere classified: Secondary | ICD-10-CM | POA: Diagnosis not present

## 2022-01-27 DIAGNOSIS — M25512 Pain in left shoulder: Secondary | ICD-10-CM | POA: Diagnosis not present

## 2022-01-29 DIAGNOSIS — M25612 Stiffness of left shoulder, not elsewhere classified: Secondary | ICD-10-CM | POA: Diagnosis not present

## 2022-01-29 DIAGNOSIS — M25512 Pain in left shoulder: Secondary | ICD-10-CM | POA: Diagnosis not present

## 2022-01-29 DIAGNOSIS — M6281 Muscle weakness (generalized): Secondary | ICD-10-CM | POA: Diagnosis not present

## 2022-02-03 DIAGNOSIS — M6281 Muscle weakness (generalized): Secondary | ICD-10-CM | POA: Diagnosis not present

## 2022-02-03 DIAGNOSIS — M25512 Pain in left shoulder: Secondary | ICD-10-CM | POA: Diagnosis not present

## 2022-02-03 DIAGNOSIS — M25612 Stiffness of left shoulder, not elsewhere classified: Secondary | ICD-10-CM | POA: Diagnosis not present

## 2022-02-05 DIAGNOSIS — M6281 Muscle weakness (generalized): Secondary | ICD-10-CM | POA: Diagnosis not present

## 2022-02-05 DIAGNOSIS — M25612 Stiffness of left shoulder, not elsewhere classified: Secondary | ICD-10-CM | POA: Diagnosis not present

## 2022-02-05 DIAGNOSIS — M25512 Pain in left shoulder: Secondary | ICD-10-CM | POA: Diagnosis not present

## 2022-02-12 ENCOUNTER — Other Ambulatory Visit: Payer: BC Managed Care – PPO

## 2022-02-13 DIAGNOSIS — M7662 Achilles tendinitis, left leg: Secondary | ICD-10-CM | POA: Diagnosis not present

## 2022-02-14 DIAGNOSIS — M6281 Muscle weakness (generalized): Secondary | ICD-10-CM | POA: Diagnosis not present

## 2022-02-14 DIAGNOSIS — M25612 Stiffness of left shoulder, not elsewhere classified: Secondary | ICD-10-CM | POA: Diagnosis not present

## 2022-02-14 DIAGNOSIS — M25512 Pain in left shoulder: Secondary | ICD-10-CM | POA: Diagnosis not present

## 2022-02-17 DIAGNOSIS — M25612 Stiffness of left shoulder, not elsewhere classified: Secondary | ICD-10-CM | POA: Diagnosis not present

## 2022-02-17 DIAGNOSIS — M25512 Pain in left shoulder: Secondary | ICD-10-CM | POA: Diagnosis not present

## 2022-02-17 DIAGNOSIS — M6281 Muscle weakness (generalized): Secondary | ICD-10-CM | POA: Diagnosis not present

## 2022-02-21 DIAGNOSIS — M6281 Muscle weakness (generalized): Secondary | ICD-10-CM | POA: Diagnosis not present

## 2022-02-21 DIAGNOSIS — M25512 Pain in left shoulder: Secondary | ICD-10-CM | POA: Diagnosis not present

## 2022-02-21 DIAGNOSIS — M25612 Stiffness of left shoulder, not elsewhere classified: Secondary | ICD-10-CM | POA: Diagnosis not present

## 2022-03-03 DIAGNOSIS — M25612 Stiffness of left shoulder, not elsewhere classified: Secondary | ICD-10-CM | POA: Diagnosis not present

## 2022-03-03 DIAGNOSIS — M25512 Pain in left shoulder: Secondary | ICD-10-CM | POA: Diagnosis not present

## 2022-03-03 DIAGNOSIS — M6281 Muscle weakness (generalized): Secondary | ICD-10-CM | POA: Diagnosis not present

## 2022-03-04 DIAGNOSIS — M19012 Primary osteoarthritis, left shoulder: Secondary | ICD-10-CM | POA: Diagnosis not present

## 2022-03-05 DIAGNOSIS — M25612 Stiffness of left shoulder, not elsewhere classified: Secondary | ICD-10-CM | POA: Diagnosis not present

## 2022-03-05 DIAGNOSIS — M25512 Pain in left shoulder: Secondary | ICD-10-CM | POA: Diagnosis not present

## 2022-03-05 DIAGNOSIS — M6281 Muscle weakness (generalized): Secondary | ICD-10-CM | POA: Diagnosis not present

## 2022-03-06 ENCOUNTER — Ambulatory Visit: Payer: BC Managed Care – PPO | Admitting: Neurology

## 2022-03-13 DIAGNOSIS — M6281 Muscle weakness (generalized): Secondary | ICD-10-CM | POA: Diagnosis not present

## 2022-03-13 DIAGNOSIS — M25512 Pain in left shoulder: Secondary | ICD-10-CM | POA: Diagnosis not present

## 2022-03-13 DIAGNOSIS — M25612 Stiffness of left shoulder, not elsewhere classified: Secondary | ICD-10-CM | POA: Diagnosis not present

## 2022-03-20 DIAGNOSIS — M25512 Pain in left shoulder: Secondary | ICD-10-CM | POA: Diagnosis not present

## 2022-03-20 DIAGNOSIS — M25612 Stiffness of left shoulder, not elsewhere classified: Secondary | ICD-10-CM | POA: Diagnosis not present

## 2022-03-20 DIAGNOSIS — M6281 Muscle weakness (generalized): Secondary | ICD-10-CM | POA: Diagnosis not present

## 2022-03-26 ENCOUNTER — Other Ambulatory Visit: Payer: Self-pay | Admitting: Adult Health

## 2022-03-26 NOTE — Telephone Encounter (Signed)
I called pt and she is taking 1/2 tablet of a '150mg'$  scored tablet and takes as needed.  [place] drug registry checked and last fill 12-27-2020 #90.  She said the '150mg'$  was too intense.  She is ok with halfing tabs.  Last seen 10-28-21.

## 2022-03-28 DIAGNOSIS — M6281 Muscle weakness (generalized): Secondary | ICD-10-CM | POA: Diagnosis not present

## 2022-03-28 DIAGNOSIS — M25612 Stiffness of left shoulder, not elsewhere classified: Secondary | ICD-10-CM | POA: Diagnosis not present

## 2022-03-28 DIAGNOSIS — M25512 Pain in left shoulder: Secondary | ICD-10-CM | POA: Diagnosis not present

## 2022-04-04 DIAGNOSIS — M6281 Muscle weakness (generalized): Secondary | ICD-10-CM | POA: Diagnosis not present

## 2022-04-04 DIAGNOSIS — M25512 Pain in left shoulder: Secondary | ICD-10-CM | POA: Diagnosis not present

## 2022-04-04 DIAGNOSIS — M25612 Stiffness of left shoulder, not elsewhere classified: Secondary | ICD-10-CM | POA: Diagnosis not present

## 2022-04-08 DIAGNOSIS — M19012 Primary osteoarthritis, left shoulder: Secondary | ICD-10-CM | POA: Diagnosis not present

## 2022-04-11 DIAGNOSIS — M25512 Pain in left shoulder: Secondary | ICD-10-CM | POA: Diagnosis not present

## 2022-04-11 DIAGNOSIS — M6281 Muscle weakness (generalized): Secondary | ICD-10-CM | POA: Diagnosis not present

## 2022-04-11 DIAGNOSIS — M25612 Stiffness of left shoulder, not elsewhere classified: Secondary | ICD-10-CM | POA: Diagnosis not present

## 2022-04-16 DIAGNOSIS — M25512 Pain in left shoulder: Secondary | ICD-10-CM | POA: Diagnosis not present

## 2022-04-16 DIAGNOSIS — M6281 Muscle weakness (generalized): Secondary | ICD-10-CM | POA: Diagnosis not present

## 2022-04-16 DIAGNOSIS — M25612 Stiffness of left shoulder, not elsewhere classified: Secondary | ICD-10-CM | POA: Diagnosis not present

## 2022-05-05 ENCOUNTER — Ambulatory Visit: Payer: BC Managed Care – PPO | Admitting: Adult Health

## 2022-05-05 ENCOUNTER — Encounter: Payer: Self-pay | Admitting: Adult Health

## 2022-05-05 VITALS — BP 136/80 | HR 68 | Ht 70.0 in | Wt 277.8 lb

## 2022-05-05 DIAGNOSIS — G4761 Periodic limb movement disorder: Secondary | ICD-10-CM

## 2022-05-05 DIAGNOSIS — G35 Multiple sclerosis: Secondary | ICD-10-CM | POA: Diagnosis not present

## 2022-05-05 NOTE — Progress Notes (Signed)
PATIENT: [name] DOB: December 03, 1960  REASON FOR VISIT: follow up HISTORY FROM: patient  Chief Complaint  Patient presents with   Follow-up    Pt in 7  Pt here for MS f/u Pt states  Pain states she is having a lot  of joint pain and trouble sleeping      HISTORY OF PRESENT ILLNESS: Today 05/05/22:  [name] is a 61 year old female with a history of multiple sclerosis.  She returns today for follow-up.  She remains on Ocrevus.  Last visit was with Dr. Felecia Shelling. Overall she feels that is having a lot of joint and muscle pain. Since July has gotten worse. Not taking anything for pain. Was taking meloxicam for her shoulder.  She does feel that since she stopped this medication she has been having more joint pain.  Has not discussed with her PCP.  Feels that urinary urgency has improved. She never took oxybutynin. No issues with bowels.  Gait/balance: reports having some knee issue on the left that has affected her gait. Feels that she may need knee replacement Vision: Went to neurologist a year ago and was stable and was discharged.  She is states that she is having blurry vision.  Right greater than left.  She is not sure if it is her visual acuity or possible recurrence of uveitis.  She is not having any eye pain or redness.  Plans to follow-up with optometrist to discuss visual acuity.   10/28/21 (copied from Dr. Felecia Shelling): She is a 61 year old woman who was diagnosed with MS in 2000.  She is on Ocrevus, she has a 1-2 week period of flu-like reaction a couple weeks after the infusion.   MS has been stable and she has had no exacerbation.   Her last exacerbation was in 2014 before the switch to Tysabri.   She is walking without an aide.    She tires out easily.   She feels she could walk a mile without a break.   Her left side is weaker than her right and she has some left leg spasticity.   She has urinary urgency and occasional urge incontinence.   She has not been placed on any medication for  this.  She has had chronic right uveitis.       She sleeps poorly and notes restless legs and just general restlessness.   She is taking Mirapex XR once a day and additional dose at bedtime.  She still wakes up with the restless sensation but since the nighttime dose was increased, she has done better.    She still notes left leg spasticity.        She had cervical ACDF 12/2019 C5-C7.     She continues to have left neck pain and pain down the left arm.   Pain will go down to the elbow but not to the hand.      MS History: She was diagnosed in 2000 after presenting with unilateral numbness.   She ws started on Avonex. .   In retrospect, she also had left optic neuritis in 1996.     She switched from Avonex to Rebif due to breakthrough activity.   She was switched to Tysabri due to breakthrough activity.   She became JCV Ab positive and switched to [place] in 2022.          MRI Brain 05/09/2020 shows   Multiple T2/FLAIR hyperintense foci in the cerebellum, brainstem, thalamus and hemispheres in a pattern and configuration  consistent with chronic demyelinating plaque associated with multiple sclerosis. None of the foci appears to be acute.  No changes compared to 06/28/2019   MRI cervical spine 05/09/2020 shows    There are T2 hyperintense foci within the posterior spinal cord adjacent to C2 , anteriorly adjacent to C3, posteriorly adjacent to C3-C4 and anteriorly to the left adjacent to C4-C5.and multilevel DJD with mild spinal stenosis at C4C5 and C6C6 and significant foraminal narrowing at C3C4 and C5C6.     Potential for right C4, right C5, bilateral C6 and left C7 nerve root compression.   04/17/21: [name] is a 61 year old female with a history of relapsed remitting multiple sclerosis.  She returns today for follow-up.  At the last visit she was switched to [place].  Reports that she has tolerated it well.  Since her last visit she has had neck surgery.  She states since then she has noticed numbness in  the right hand.  She reports that her neurosurgeon thinks it is carpal tunnel but nerve conduction studies have not been completed.  She also states that she has been having more shoulder pain and knee pain.  Reports that she has been diagnosed with bursitis.  Not sure of if Ocrevus has anything to do with this?  Patient denies any changes with her balance.  No changes with the bowels or bladder.  Patient continues to take long-acting Mirapex and short acting Mirapex 0.25 mg at bedtime.  She reports for the most part this works very well for her.  She states approximately once every 2 weeks she will have a restless night.  She returns today for an evaluation.  10/22/20 [name] is a 61 year old female with a history of relapsed remitting multiple sclerosis.  She returns today for follow-up.  She remains on Tysabri.  She had blood work prior to this visit.  We are still waiting on the JCV index.  The patient states overall she is remained stable.  She continues to have numbness in the hands left side greater than right.  She did have nerve conduction studies completed at [place] [facility] but has not received the results.  She also reports that she has a torn MCL in the left knee.  They have not surgically corrected this.  Patient feels that bowels and bladder have remained stable.  No significant changes in her vision.  She states that she may be having a flareup of uveitis in the left eye.  She has not seen her ophthalmologist recently.  She does have prednisone drops that she can use when this happens.  04/24/20 [name] is a 61 year old female with a history of relapsed remitting multiple sclerosis.  She returns today for follow-up.  She remains on Tysabri.  She denies any new symptoms.  No new changes with the bowels or bladder.  No new weak numbness or weakness.  No changes with her gait or balance.  She does state that she changed to another job in March.  She has been under a lot of stress.  She  states that this has resulted in some panic attacks.  She also noted some cognitive changes.  She reports the inability to concentrate and focus at times.  Also reports that she can be forgetful with things like where she placed her keys it.  She denies any significant changes at home.  Able to complete all ADLs independently.  Operates a motor vehicle without difficulty.  Manages her own finances without difficulty.  She  returns today for an evaluation.  HISTORY 10/17/19:   [name] is a 61 year old female with a history of relapsed remitting multiple sclerosis.  She returns today for follow-up.  She continues on Tysabri.  Denies any new symptoms.  Denies any changes with her vision.  No changes with the bowels or bladder with the exception that she has diarrhea more often now.  No change in her diet.  She reports that she had a fall in December.  Reports that she slipped on her back deck.  Reports that she injured her left knee but this is slowly improving.  Denies any new weakness.  Continues to have numbness down the left arm.  Has not followed up with Dr. Ronnald Ramp.  REVIEW OF SYSTEMS: Out of a complete 14 system review of symptoms, the patient complains only of the following symptoms, and all other reviewed systems are negative.  See HPI  ALLERGIES: Allergies  Allergen Reactions   Penicillins Rash    HOME MEDICATIONS: Outpatient Medications Prior to Visit  Medication Sig Dispense Refill   ALPRAZolam (XANAX) 0.5 MG tablet Take 1 tablet 1 hour prior to MRI and another if needed when arrives at site. (Pt must have driver) 2 tablet 0   Armodafinil 150 MG tablet TAKE 1 TABLET BY MOUTH EVERY DAY (Patient taking differently: as needed. 1/2 tablet has needed) 90 tablet 0   atorvastatin (LIPITOR) 10 MG tablet 10 mg daily.     buPROPion (WELLBUTRIN XL) 150 MG 24 hr tablet Take 150 mg by mouth every morning.     escitalopram (LEXAPRO) 10 MG tablet Take 5 mg by mouth daily.     ibuprofen  (ADVIL,MOTRIN) 200 MG tablet Take 400 mg by mouth every 6 (six) hours as needed.     losartan (COZAAR) 50 MG tablet Take 1 tablet by mouth daily.     Ocrelizumab (OCREVUS IV) Inject into the vein every 6 (six) months. 1st dose 11-2020     pantoprazole (PROTONIX) 20 MG tablet 20 mg daily.     pramipexole (MIRAPEX) 0.25 MG tablet Take 1 tablet (0.25 mg total) by mouth 2 (two) times daily as needed. 180 tablet 3   Pramipexole Dihydrochloride 0.375 MG TB24 TAKE 1 TABLET (0.375 MG TOTAL) BY MOUTH AT BEDTIME. 90 tablet 3   prednisoLONE acetate (PRED FORTE) 1 % ophthalmic suspension As needed     VENTOLIN HFA 108 (90 Base) MCG/ACT inhaler      acetaminophen (TYLENOL) 500 MG tablet Take 1,000 mg by mouth daily as needed.     cyclobenzaprine (FLEXERIL) 10 MG tablet Take 10 mg by mouth every 8 (eight) hours as needed.     meloxicam (MOBIC) 7.5 MG tablet Take 7.5 mg by mouth 2 (two) times daily.     oxybutynin (DITROPAN XL) 10 MG 24 hr tablet Take 1 tablet (10 mg total) by mouth at bedtime. 90 tablet 3   No facility-administered medications prior to visit.    PAST MEDICAL HISTORY: Past Medical History:  Diagnosis Date   Asthma    Back pain    Cardiac/pericardial tamponade 06/11/2014   s/p pericardial window 06/16/2014 per Dr. Darcey Nora   Depression    Hypertension    MS (multiple sclerosis) (HCC)    Muscle pain    Obesity    Periodic limb movement disorder (PLMD) 07/15/2016   Pleural effusion, bilateral 06/30/2014   Uveitis    Bilateral    PAST SURGICAL HISTORY: Past Surgical History:  Procedure Laterality Date   CERVICAL  FUSION     01-02-21, [place] [facility] C5-7   microdisectomy  2019   lumbar spine   PERICARDIAL TAP N/A 06/11/2014   Procedure: PERICARDIAL TAP;  Surgeon: Blane Ohara, MD;  [facility] LAB;  Service: Cardiovascular;  Laterality: N/A;   SUBXYPHOID PERICARDIAL WINDOW N/A 06/16/2014   Procedure: SUBXYPHOID PERICARDIAL WINDOW;  Surgeon: Ivin Poot, MD;  Location: [place];  Service: Thoracic;  Laterality: N/A;   TEE WITHOUT CARDIOVERSION N/A 06/16/2014   Procedure: TRANSESOPHAGEAL ECHOCARDIOGRAM (TEE);  Surgeon: Ivin Poot, MD;  Location: [facility] OR;  Service: Thoracic;  Laterality: N/A;   uterine ablasion  2012    FAMILY HISTORY: Family History  Problem Relation Age of Onset   Breast cancer Mother    Hypertension Mother    Diabetes Mother    Heart attack Father    Epilepsy Father    Endocrine tumor Brother    Multiple sclerosis Neg Hx     SOCIAL HISTORY: Social History   Socioeconomic History   Marital status: Married    Spouse name: Not on file   Number of children: 0   Years of education: MA   Highest education level: Not on file  Occupational History   Occupation: [employer]  Tobacco Use   Smoking status: Never   Smokeless tobacco: Never  Substance and Sexual Activity   Alcohol use: Yes    Alcohol/week: 0.0 standard drinks of alcohol    Comment: occasional   Drug use: No    Comment: marijuana   Sexual activity: Not Currently  Other Topics Concern   Not on file  Social History Narrative   Patient lives at home with her wife [name])    Patient works full time.   Arboriculturist.   Right handed.   Caffeine two cups daily.   Social Determinants of Health   Financial Resource Strain: Not on file  Food Insecurity: Not on file  Transportation Needs: Not on file  Physical Activity: Not on file  Stress: Not on file  Social Connections: Not on file  Intimate Partner Violence: Not on file      PHYSICAL EXAM  Vitals:   05/05/22 0950  BP: 136/80  Pulse: 68  Weight: 277 lb 12.8 oz (126 kg)  Height: '5\' 10"'$  (1.778 m)   Body mass index is 39.86 kg/m.  Vision Screening   Right eye Left eye Both eyes  Without correction '20/70 20/30 20/30 '$  With correction '20/70 20/70 20/70 '$     Generalized: Well developed, in no acute distress   Neurological examination  Mentation: Alert oriented to time, place,  history taking. Follows all commands speech and language fluent Cranial nerve II-XII: Pupils were equal round reactive to light. Extraocular movements were full, visual field were full on confrontational test. Head turning and shoulder shrug  were normal and symmetric. Motor: The motor testing reveals 5 over 5 strength of all 4 extremities. Good symmetric motor tone is noted throughout.  Sensory: Sensory testing is intact to soft touch on all 4 extremities. No evidence of extinction is noted.  Coordination: Cerebellar testing reveals good finger-nose-finger and heel-to-shin bilaterally.  Gait and station: Gait is normal.  Reflexes: Deep tendon reflexes are symmetric and normal bilaterally.   DIAGNOSTIC DATA (LABS, IMAGING, TESTING) - I reviewed patient records, labs, notes, testing and imaging myself where available.  Lab Results  Component Value Date   WBC 5.5 04/17/2021   HGB 13.7 04/17/2021   HCT 40.4 04/17/2021  MCV 86 04/17/2021   PLT 248 04/17/2021      Component Value Date/Time   NA 139 04/17/2021 0921   K 5.0 04/17/2021 0921   CL 100 04/17/2021 0921   CO2 25 04/17/2021 0921   GLUCOSE 82 04/17/2021 0921   GLUCOSE 84 07/10/2014 0944   BUN 15 04/17/2021 0921   CREATININE 0.98 04/17/2021 0921   CREATININE 0.76 06/27/2014 0915   CALCIUM 9.5 04/17/2021 0921   PROT 7.1 04/17/2021 0921   ALBUMIN 4.3 04/17/2021 0921   AST 12 04/17/2021 0921   ALT 15 04/17/2021 0921   ALKPHOS 105 04/17/2021 0921   BILITOT 0.5 04/17/2021 0921   GFRNONAA 86 04/24/2020 0915   GFRAA 99 04/24/2020 0915       ASSESSMENT AND PLAN 61 y.o. year old female  has a past medical history of Asthma, Back pain, Cardiac/pericardial tamponade (06/11/2014), Depression, Hypertension, MS (multiple sclerosis) ([place]), Muscle pain, Obesity, Periodic limb movement disorder (PLMD) (07/15/2016), Pleural effusion, bilateral (06/30/2014), and Uveitis. here with:  1.  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  Most likely patient needs prescription strength glasses however encouraged her to follow-up with optometrist within the next 1 to 2 weeks can also rule out other causes.   2.  Restless leg  Continue Mirapex long-acting 0.375 mg at bedtime Continue Mirapex short acting 0.25 mg at bedtime.   She will follow-up in 6 months with Dr. Einar Gip, MSN, NP-C 05/05/2022, 10:12 AM [facility] [address] [place], [place] [address] [phone]

## 2022-05-06 LAB — CBC WITH DIFFERENTIAL/PLATELET
Basophils Absolute: 0 10*3/uL (ref 0.0–0.2)
Basos: 1 %
EOS (ABSOLUTE): 0.1 10*3/uL (ref 0.0–0.4)
Eos: 2 %
Hematocrit: 37.3 % (ref 34.0–46.6)
Hemoglobin: 12.9 g/dL (ref 11.1–15.9)
Immature Grans (Abs): 0 10*3/uL (ref 0.0–0.1)
Immature Granulocytes: 0 %
Lymphocytes Absolute: 1.1 10*3/uL (ref 0.7–3.1)
Lymphs: 24 %
MCH: 30.4 pg (ref 26.6–33.0)
MCHC: 34.6 g/dL (ref 31.5–35.7)
MCV: 88 fL (ref 79–97)
Monocytes Absolute: 0.6 10*3/uL (ref 0.1–0.9)
Monocytes: 13 %
Neutrophils Absolute: 2.8 10*3/uL (ref 1.4–7.0)
Neutrophils: 60 %
Platelets: 236 10*3/uL (ref 150–450)
RBC: 4.24 x10E6/uL (ref 3.77–5.28)
RDW: 12.4 % (ref 11.7–15.4)
WBC: 4.6 10*3/uL (ref 3.4–10.8)

## 2022-05-06 LAB — IGG, IGA, IGM
IgA/Immunoglobulin A, Serum: 173 mg/dL (ref 87–352)
IgG (Immunoglobin G), Serum: 1150 mg/dL (ref 586–1602)
IgM (Immunoglobulin M), Srm: 69 mg/dL (ref 26–217)

## 2022-05-06 LAB — COMPREHENSIVE METABOLIC PANEL
ALT: 21 IU/L (ref 0–32)
AST: 19 IU/L (ref 0–40)
Albumin/Globulin Ratio: 1.7 (ref 1.2–2.2)
Albumin: 4.3 g/dL (ref 3.8–4.9)
Alkaline Phosphatase: 110 IU/L (ref 44–121)
BUN/Creatinine Ratio: 28 — ABNORMAL HIGH (ref 9–23)
BUN: 21 mg/dL (ref 6–24)
Bilirubin Total: 0.2 mg/dL (ref 0.0–1.2)
CO2: 22 mmol/L (ref 20–29)
Calcium: 9.4 mg/dL (ref 8.7–10.2)
Chloride: 93 mmol/L — ABNORMAL LOW (ref 96–106)
Creatinine, Ser: 0.74 mg/dL (ref 0.57–1.00)
Globulin, Total: 2.6 g/dL (ref 1.5–4.5)
Glucose: 83 mg/dL (ref 70–99)
Potassium: 5 mmol/L (ref 3.5–5.2)
Sodium: 135 mmol/L (ref 134–144)
Total Protein: 6.9 g/dL (ref 6.0–8.5)
eGFR: 93 mL/min/{1.73_m2} (ref >59)

## 2022-05-09 DIAGNOSIS — M25512 Pain in left shoulder: Secondary | ICD-10-CM | POA: Diagnosis not present

## 2022-05-09 DIAGNOSIS — M6281 Muscle weakness (generalized): Secondary | ICD-10-CM | POA: Diagnosis not present

## 2022-05-09 DIAGNOSIS — M25612 Stiffness of left shoulder, not elsewhere classified: Secondary | ICD-10-CM | POA: Diagnosis not present

## 2022-05-23 DIAGNOSIS — M6281 Muscle weakness (generalized): Secondary | ICD-10-CM | POA: Diagnosis not present

## 2022-05-23 DIAGNOSIS — M25612 Stiffness of left shoulder, not elsewhere classified: Secondary | ICD-10-CM | POA: Diagnosis not present

## 2022-05-23 DIAGNOSIS — M25512 Pain in left shoulder: Secondary | ICD-10-CM | POA: Diagnosis not present

## 2022-07-08 DIAGNOSIS — G35 Multiple sclerosis: Secondary | ICD-10-CM | POA: Diagnosis not present

## 2022-07-24 ENCOUNTER — Ambulatory Visit
Admission: RE | Admit: 2022-07-24 | Discharge: 2022-07-24 | Disposition: A | Discharge status: Home / Self Care | Payer: BC Managed Care – PPO | Source: Ambulatory Visit | Attending: Internal Medicine | Admitting: Internal Medicine

## 2022-07-24 DIAGNOSIS — M85852 Other specified disorders of bone density and structure, left thigh: Secondary | ICD-10-CM | POA: Diagnosis not present

## 2022-07-24 DIAGNOSIS — Z780 Asymptomatic menopausal state: Secondary | ICD-10-CM | POA: Diagnosis not present

## 2022-07-24 DIAGNOSIS — E2839 Other primary ovarian failure: Secondary | ICD-10-CM

## 2022-08-14 ENCOUNTER — Ambulatory Visit: Payer: BC Managed Care – PPO | Admitting: Podiatry

## 2022-08-14 ENCOUNTER — Ambulatory Visit (INDEPENDENT_AMBULATORY_CARE_PROVIDER_SITE_OTHER): Payer: BC Managed Care – PPO

## 2022-08-14 ENCOUNTER — Encounter: Payer: Self-pay | Admitting: Podiatry

## 2022-08-14 VITALS — BP 161/87 | HR 87

## 2022-08-14 DIAGNOSIS — M7661 Achilles tendinitis, right leg: Secondary | ICD-10-CM | POA: Diagnosis not present

## 2022-08-14 DIAGNOSIS — M778 Other enthesopathies, not elsewhere classified: Secondary | ICD-10-CM

## 2022-08-14 DIAGNOSIS — M7662 Achilles tendinitis, left leg: Secondary | ICD-10-CM

## 2022-08-14 MED ORDER — MELOXICAM 15 MG PO TABS: 15.0000 mg | ORAL_TABLET | Freq: Every day | ORAL | 3 refills | Status: DC

## 2022-08-14 MED ORDER — METHYLPREDNISOLONE 4 MG PO TBPK: ORAL_TABLET | ORAL | 0 refills | Status: DC

## 2022-08-14 MED ORDER — DEXAMETHASONE SODIUM PHOSPHATE 120 MG/30ML IJ SOLN
2.0000 mg | Freq: Once | INTRAMUSCULAR | Status: AC
  Administered 2022-08-14: 2 mg via INTRA_ARTICULAR

## 2022-08-17 NOTE — Progress Notes (Signed)
Subjective:  Patient ID: Aarushi Hemric, female    DOB: 1961/01/16,  MRN: [id number] HPI Chief Complaint  Patient presents with   Foot Pain    Posterior heel bilateral (L>R) - aching x several months, AM pain, tried stretching, supports, knee pain after a lot of walking, burning sensations at night   New Patient (Initial Visit)    62 y.o. female presents with the above complaint.   ROS: Denies fever chills nausea vomiting muscle aches pains calf pain back pain chest pain shortness of breath.  Past Medical History:  Diagnosis Date   Asthma    Back pain    Cardiac/pericardial tamponade 06/11/2014   s/p pericardial window 06/16/2014 per Dr. Darcey Nora   Depression    Hypertension    MS (multiple sclerosis) ([place])    Muscle pain    Obesity    Periodic limb movement disorder (PLMD) 07/15/2016   Pleural effusion, bilateral 06/30/2014   Uveitis    Bilateral   Past Surgical History:  Procedure Laterality Date   CERVICAL FUSION     01-02-21, [place] [facility] C5-7   microdisectomy  2019   lumbar spine   PERICARDIAL TAP N/A 06/11/2014   Procedure: PERICARDIAL TAP;  Surgeon: Blane Ohara, MD;  Location: [facility] CATH LAB;  Service: Cardiovascular;  Laterality: N/A;   SUBXYPHOID PERICARDIAL WINDOW N/A 06/16/2014   Procedure: SUBXYPHOID PERICARDIAL WINDOW;  Surgeon: Ivin Poot, MD;  Location: [place];  Service: Thoracic;  Laterality: N/A;   TEE WITHOUT CARDIOVERSION N/A 06/16/2014   Procedure: TRANSESOPHAGEAL ECHOCARDIOGRAM (TEE);  Surgeon: Ivin Poot, MD;  Location: [place];  Service: Thoracic;  Laterality: N/A;   uterine ablasion  2012    Current Outpatient Medications:    hydrochlorothiazide (HYDRODIURIL) 25 MG tablet, Take 25 mg by mouth daily., Disp: , Rfl:    meloxicam (MOBIC) 15 MG tablet, Take 1 tablet (15 mg total) by mouth daily., Disp: 30 tablet, Rfl: 3   methylPREDNISolone (MEDROL DOSEPAK) 4 MG TBPK tablet, 6 day dose pack - take as directed, Disp: 21 tablet, Rfl: 0   ALPRAZolam  (XANAX) 0.5 MG tablet, Take 1 tablet 1 hour prior to MRI and another if needed when arrives at site. (Pt must have driver), Disp: 2 tablet, Rfl: 0   Armodafinil 150 MG tablet, TAKE 1 TABLET BY MOUTH EVERY DAY (Patient taking differently: as needed. 1/2 tablet has needed), Disp: 90 tablet, Rfl: 0   atorvastatin (LIPITOR) 10 MG tablet, 10 mg daily., Disp: , Rfl:    buPROPion (WELLBUTRIN XL) 150 MG 24 hr tablet, Take 150 mg by mouth every morning., Disp: , Rfl:    cyclobenzaprine (FLEXERIL) 10 MG tablet, Take 10 mg by mouth every 8 (eight) hours as needed., Disp: , Rfl:    escitalopram (LEXAPRO) 10 MG tablet, Take 5 mg by mouth daily., Disp: , Rfl:    ibuprofen (ADVIL,MOTRIN) 200 MG tablet, Take 400 mg by mouth every 6 (six) hours as needed., Disp: , Rfl:    losartan (COZAAR) 50 MG tablet, Take 1 tablet by mouth daily., Disp: , Rfl:    Ocrelizumab (OCREVUS IV), Inject into the vein every 6 (six) months. 1st dose 11-2020, Disp: , Rfl:    pantoprazole (PROTONIX) 20 MG tablet, 20 mg daily., Disp: , Rfl:    pramipexole (MIRAPEX) 0.25 MG tablet, Take 1 tablet (0.25 mg total) by mouth 2 (two) times daily as needed., Disp: 180 tablet, Rfl: 3   Pramipexole Dihydrochloride 0.375 MG TB24, TAKE 1 TABLET (0.375 MG  TOTAL) BY MOUTH AT BEDTIME., Disp: 90 tablet, Rfl: 3   prednisoLONE acetate (PRED FORTE) 1 % ophthalmic suspension, As needed, Disp: , Rfl:    VENTOLIN HFA 108 (90 Base) MCG/ACT inhaler, , Disp: , Rfl:   Allergies  Allergen Reactions   Penicillins Rash   Review of Systems Objective:   Vitals:   08/14/22 0944  BP: (!) 161/87  Pulse: 87    General: Well developed, nourished, in no acute distress, alert and oriented x3   Dermatological: Skin is warm, dry and supple bilateral. Nails x 10 are well maintained; remaining integument appears unremarkable at this time. There are no open sores, no preulcerative lesions, no rash or signs of infection present.  Vascular: Dorsalis Pedis artery and  Posterior Tibial artery pedal pulses are 2/4 bilateral with immedate capillary fill time. Pedal hair growth present. No varicosities and no lower extremity edema present bilateral.   Neruologic: Grossly intact via light touch bilateral. Vibratory intact via tuning fork bilateral. Protective threshold with Semmes Wienstein monofilament intact to all pedal sites bilateral. Patellar and Achilles deep tendon reflexes 2+ bilateral. No Babinski or clonus noted bilateral.   Musculoskeletal: No gross boney pedal deformities bilateral. No pain, crepitus, or limitation noted with foot and ankle range of motion bilateral. Muscular strength 5/5 in all groups tested bilateral.  Swelling and warmth on palpation of the Achilles tendon right with fluctuance in the posterior aspect of the heel at its insertion site.  Left heel demonstrates a bulbous mass in the Achilles which is firm to touch but moderately tender does not appear to be ganglion or tumorous however I do think that is probably an old tear within the substance of the Achilles.  Gait: Unassisted, Nonantalgic.    Radiographs:  Graphs taken today demonstrate an osseously mature foot bilaterally.  Mild pes planovalgus.  Flexible foot deformity.  She also has generalized demineralization throughout the foot bilaterally.  Large os naviculare bilateral.  Achilles tendon left appears to be relatively normal though mildly thickened Kager's triangle is somewhat opaque with fluid most likely.  Contralateral foot left demonstrates thickening of the Achilles tendon superior to its insertion site and thinning to a normal tendon size and distally.  There is minimal spurring bilateral in the posterior plantar heel.    Assessment & Plan:   Assessment: Achilles tendinitis probable tear of the left Achilles at 1 point which is gone on to heal but still tender.  Bilateral Achilles tendinitis bursitis posterior heels  Plan: Discussed etiology pathology conservative  versus surgical therapies at this point we discussed placing a small amount of dexamethasone 2 mg subcutaneously at the point of maximal tenderness particularly on the left foot she does have some radiating pain.  Will start methylprednisolone to be followed by meloxicam placed her in a night splint and then I will follow-up with her in about 6 weeks prior to her trip to [place].     [place], [place]

## 2022-08-28 ENCOUNTER — Other Ambulatory Visit: Payer: Self-pay | Admitting: Adult Health

## 2022-09-18 DIAGNOSIS — M542 Cervicalgia: Secondary | ICD-10-CM | POA: Diagnosis not present

## 2022-09-25 ENCOUNTER — Ambulatory Visit: Payer: BC Managed Care – PPO | Admitting: Podiatry

## 2022-09-29 ENCOUNTER — Encounter: Payer: Self-pay | Admitting: Neurology

## 2022-10-06 ENCOUNTER — Ambulatory Visit: Payer: BC Managed Care – PPO | Admitting: Podiatry

## 2022-10-25 DIAGNOSIS — Z20822 Contact with and (suspected) exposure to covid-19: Secondary | ICD-10-CM | POA: Diagnosis not present

## 2022-10-25 DIAGNOSIS — J189 Pneumonia, unspecified organism: Secondary | ICD-10-CM | POA: Diagnosis not present

## 2022-10-25 DIAGNOSIS — J029 Acute pharyngitis, unspecified: Secondary | ICD-10-CM | POA: Diagnosis not present

## 2022-10-25 DIAGNOSIS — R519 Headache, unspecified: Secondary | ICD-10-CM | POA: Diagnosis not present

## 2022-10-28 ENCOUNTER — Other Ambulatory Visit: Payer: Self-pay | Admitting: Internal Medicine

## 2022-10-28 ENCOUNTER — Ambulatory Visit
Admission: RE | Admit: 2022-10-28 | Discharge: 2022-10-28 | Disposition: A | Discharge status: Home / Self Care | Payer: BC Managed Care – PPO | Source: Ambulatory Visit | Attending: Internal Medicine | Admitting: Internal Medicine

## 2022-10-28 DIAGNOSIS — R0602 Shortness of breath: Secondary | ICD-10-CM | POA: Diagnosis not present

## 2022-10-28 DIAGNOSIS — K439 Ventral hernia without obstruction or gangrene: Secondary | ICD-10-CM | POA: Diagnosis not present

## 2022-10-28 DIAGNOSIS — J189 Pneumonia, unspecified organism: Secondary | ICD-10-CM | POA: Diagnosis not present

## 2022-10-30 ENCOUNTER — Ambulatory Visit: Payer: BC Managed Care – PPO | Admitting: Podiatry

## 2022-11-04 ENCOUNTER — Ambulatory Visit: Payer: BC Managed Care – PPO | Admitting: Neurology

## 2022-11-04 ENCOUNTER — Encounter: Payer: Self-pay | Admitting: Neurology

## 2022-11-04 VITALS — BP 125/83 | HR 66 | Ht 69.0 in | Wt 268.5 lb

## 2022-11-04 DIAGNOSIS — G35 Multiple sclerosis: Secondary | ICD-10-CM

## 2022-11-04 DIAGNOSIS — Z79899 Other long term (current) drug therapy: Secondary | ICD-10-CM

## 2022-11-04 DIAGNOSIS — N3941 Urge incontinence: Secondary | ICD-10-CM | POA: Diagnosis not present

## 2022-11-04 DIAGNOSIS — G459 Transient cerebral ischemic attack, unspecified: Secondary | ICD-10-CM

## 2022-11-04 DIAGNOSIS — G4761 Periodic limb movement disorder: Secondary | ICD-10-CM | POA: Diagnosis not present

## 2022-11-04 NOTE — Progress Notes (Signed)
[facility]  PATIENT: [name] DOB: 12/24/1960  REFERRING DOCTOR OR PCP:  Dr. Fara Olden SOURCE: Patient, note from Dr. Jannifer Franklin and Ward Givens, NP, laboratory and imaging reports, MRI images personally reviewed.  _________________________________   HISTORICAL  CHIEF COMPLAINT:  Chief Complaint  Patient presents with   Follow-up    Pt in room 10, here for MS follow up. Pt feel left side unbalanced when walking has noticed in last 6 months. No falls, pt prescribed glasses x 1 month now.    HISTORY OF PRESENT ILLNESS:  [name] is a 62 y.o. woman with multiple sclerosis.  Update 11/04/2022 She was diagnosed with MS in 2000.  She is on Ocrevus next one in May 2024.   She feels sick (flu-like) about 1 week after the infusions for anther week or two.   MS has been stable and she has had no exacerbation.   Her last exacerbation was in 2014 before the switch to Tysabri.  However, over the last year, she notes she leans more to the left or it is mildly weaker.    She had one episode lasting one minute while watching TV when she had trouble understanding her partner or the TV.   She did not talk during it.   She has a pericardial window due to tamponade, likely post-viral around 2015.    She is walking without an aide.    She tires out easily.   She feels she could walk a mile without a break.   Her left side is weaker than her right and she has some left leg spasticity.   She has urinary urgency and occasional urge incontinence.   She has not been placed on any medication for this.  She has had chronic right uveitis.      She sleeps poorly and notes restless legs and just general restlessness.   She is taking Mirapex XR once a day and additional dose at bedtime.  She still wakes up with the restless sensation but since the nighttime dose was increased, she has done better.    She still notes left leg spasticity.       She had cervical ACDF 12/2019 C5-C7.     She  continues to have left neck pain and pain down the left arm.   Pain will go down to the elbow but not to the hand.     MS History: She was diagnosed in 2000 after presenting with unilateral numbness.   She ws started on Avonex. .   In retrospect, she also had left optic neuritis in 1996.     She switched from Avonex to Rebif due to breakthrough activity.   She was switched to Tysabri due to breakthrough activity.   She became JCV Ab positive and switched to [place] in 2022.       Imaging: MRI Brain 05/09/2020 shows   Multiple T2/FLAIR hyperintense foci in the cerebellum, brainstem, thalamus and hemispheres in a pattern and configuration consistent with chronic demyelinating plaque associated with multiple sclerosis. None of the foci appears to be acute.  No changes compared to 06/28/2019  MRI cervical spine 05/09/2020 shows    There are T2 hyperintense foci within the posterior spinal cord adjacent to C2 , anteriorly adjacent to C3, posteriorly adjacent to C3-C4 and anteriorly to the left adjacent to C4-C5.and multilevel DJD with mild spinal stenosis at C4C5 and C6C6 and significant foraminal narrowing at C3C4 and C5C6.     Potential for right  C4, right C5, bilateral C6 and left C7 nerve root compression.    MRI of the brain 11/19/2021 shows T2/FLAIR hyperintense foci in the cerebellum, middle cerebellar peduncle, thalamus, upper spinal cord and in the cerebral hemispheres in a pattern and configuration consistent with chronic demyelinating plaque associated with multiple sclerosis. None of the foci appear to be acute. Compared to the MRI from 05/09/2020, there were no new lesions.   MRI of the cervical spine 11/19/2021 shows multiple T2 hyperintense foci within the spinal cord. These are located adjacent to C1-C2, adjacent to C2, anteriorly adjacent to C2-C3/anterocentrally adjacent to C3, posterolaterally to the right adjacent to C3-C4. Compared to the MRI dated 05/09/2020, there did not appear to be any new  lesions. .   C4-C7 ACDF.  There is residual mild spinal stenosis at C6-C7 though there does not appear to be nerve root compression at any of these levels.  .   Mild degenerative changes at C2-C3 and C3-C4 that do not lead to any nerve root compression or spinal stenosis. REVIEW OF SYSTEMS: Constitutional: No fevers, chills, sweats, or change in appetite Eyes: No visual changes, double vision, eye pain Ear, nose and throat: No hearing loss, ear pain, nasal congestion, sore throat Cardiovascular: No chest pain, palpitations Respiratory:  No shortness of breath at rest or with exertion.   No wheezes GastrointestinaI: No nausea, vomiting, diarrhea, abdominal pain, fecal incontinence Genitourinary:  No dysuria, urinary retention or frequency.  No nocturia. Musculoskeletal:  No neck pain, back pain Integumentary: No rash, pruritus, skin lesions Neurological: as above Psychiatric: No depression at this time.  No anxiety Endocrine: No palpitations, diaphoresis, change in appetite, change in weigh or increased thirst Hematologic/Lymphatic:  No anemia, purpura, petechiae. Allergic/Immunologic: No itchy/runny eyes, nasal congestion, recent allergic reactions, rashes  ALLERGIES: Allergies  Allergen Reactions   Penicillins Rash    HOME MEDICATIONS:  Current Outpatient Medications:    ALPRAZolam (XANAX) 0.5 MG tablet, Take 1 tablet 1 hour prior to MRI and another if needed when arrives at site. (Pt must have driver), Disp: 2 tablet, Rfl: 0   Armodafinil 150 MG tablet, TAKE 1 TABLET BY MOUTH EVERY DAY (Patient taking differently: as needed. 1/2 tablet has needed), Disp: 90 tablet, Rfl: 0   atorvastatin (LIPITOR) 10 MG tablet, 10 mg daily., Disp: , Rfl:    buPROPion (WELLBUTRIN XL) 150 MG 24 hr tablet, Take 150 mg by mouth every morning., Disp: , Rfl:    escitalopram (LEXAPRO) 10 MG tablet, Take 5 mg by mouth daily., Disp: , Rfl:    hydrochlorothiazide (HYDRODIURIL) 25 MG tablet, Take 25 mg by mouth  daily., Disp: , Rfl:    ibuprofen (ADVIL,MOTRIN) 200 MG tablet, Take 400 mg by mouth every 6 (six) hours as needed., Disp: , Rfl:    losartan (COZAAR) 50 MG tablet, Take 1 tablet by mouth daily., Disp: , Rfl:    meloxicam (MOBIC) 15 MG tablet, Take 1 tablet (15 mg total) by mouth daily., Disp: 30 tablet, Rfl: 3   methylPREDNISolone (MEDROL DOSEPAK) 4 MG TBPK tablet, 6 day dose pack - take as directed, Disp: 21 tablet, Rfl: 0   Ocrelizumab (OCREVUS IV), Inject into the vein every 6 (six) months. 1st dose 11-2020, Disp: , Rfl:    pantoprazole (PROTONIX) 20 MG tablet, 20 mg daily., Disp: , Rfl:    pramipexole (MIRAPEX) 0.25 MG tablet, TAKE 1 TABLET BY MOUTH 2 TIMES DAILY AS NEEDED., Disp: 180 tablet, Rfl: 2   Pramipexole Dihydrochloride 0.375 MG TB24,  TAKE 1 TABLET (0.375 MG TOTAL) BY MOUTH AT BEDTIME., Disp: 90 tablet, Rfl: 3   prednisoLONE acetate (PRED FORTE) 1 % ophthalmic suspension, As needed, Disp: , Rfl:    VENTOLIN HFA 108 (90 Base) MCG/ACT inhaler, , Disp: , Rfl:    cyclobenzaprine (FLEXERIL) 10 MG tablet, Take 10 mg by mouth every 8 (eight) hours as needed. (Patient not taking: Reported on 11/04/2022), Disp: , Rfl:   PAST MEDICAL HISTORY: Past Medical History:  Diagnosis Date   Asthma    Back pain    Cardiac/pericardial tamponade 06/11/2014   s/p pericardial window 06/16/2014 per Dr. Darcey Nora   Depression    Hypertension    MS (multiple sclerosis) ([place])    Muscle pain    Obesity    Periodic limb movement disorder (PLMD) 07/15/2016   Pleural effusion, bilateral 06/30/2014   Uveitis    Bilateral    PAST SURGICAL HISTORY: Past Surgical History:  Procedure Laterality Date   CERVICAL FUSION     01-02-21, [place] NS C5-7   microdisectomy  2019   lumbar spine   PERICARDIAL TAP N/A 06/11/2014   Procedure: PERICARDIAL TAP;  Surgeon: Blane Ohara, MD;  Location: [facility] CATH LAB;  Service: Cardiovascular;  Laterality: N/A;   SUBXYPHOID PERICARDIAL WINDOW N/A 06/16/2014   Procedure:  SUBXYPHOID PERICARDIAL WINDOW;  Surgeon: Ivin Poot, MD;  Location: [place];  Service: Thoracic;  Laterality: N/A;   TEE WITHOUT CARDIOVERSION N/A 06/16/2014   Procedure: TRANSESOPHAGEAL ECHOCARDIOGRAM (TEE);  Surgeon: Ivin Poot, MD;  [facility];  Service: Thoracic;  Laterality: N/A;   uterine ablasion  2012    FAMILY HISTORY: Family History  Problem Relation Age of Onset   Breast cancer Mother    Hypertension Mother    Diabetes Mother    Heart attack Father    Epilepsy Father    Endocrine tumor Brother    Multiple sclerosis Neg Hx     SOCIAL HISTORY:  Social History   Socioeconomic History   Marital status: Married    Spouse name: Not on file   Number of children: 0   Years of education: MA   Highest education level: Not on file  Occupational History   Occupation: [employer]  Tobacco Use   Smoking status: Never   Smokeless tobacco: Never  Substance and Sexual Activity   Alcohol use: Yes    Alcohol/week: 0.0 standard drinks of alcohol    Comment: occasional   Drug use: No    Comment: marijuana   Sexual activity: Not Currently  Other Topics Concern   Not on file  Social History Narrative   Patient lives at home with her wife [name])    Patient works full time.   Arboriculturist.   Right handed.   Caffeine two cups daily.   Social Determinants of Health   Financial Resource Strain: Not on file  Food Insecurity: Not on file  Transportation Needs: Not on file  Physical Activity: Not on file  Stress: Not on file  Social Connections: Not on file  Intimate Partner Violence: Not on file     PHYSICAL EXAM  Vitals:   11/04/22 0949 11/04/22 0955  BP: (!) 151/75 125/83  Pulse: 66   Weight: 268 lb 8 oz (121.8 kg)   Height: 5\' 9"  (1.753 m)     Body mass index is 39.65 kg/m.   General: The patient is well-developed and well-nourished and in no acute distress  HEENT:  Head is  [place]/AT.  Sclera are anicteric.    Neck: No  carotid bruits are noted.  The neck is nontender.  Cardiovascular: The heart has a regular rate and rhythm with a normal S1 and S2. There were no murmurs, gallops or rubs.    Skin: Extremities are without rash or  edema.  Musculoskeletal:  Back is nontender  Neurologic Exam  Mental status: The patient is alert and oriented x 3 at the time of the examination. The patient has apparent normal recent and remote memory, with an apparently normal attention span and concentration ability.   Speech is normal.  Cranial nerves: Extraocular movements are full. Pupils are equal, round, and reactive to light and accomodation.  Facial strength and sensation was normal.  Trapezius strength was normal.  The tongue is midline, and the patient has symmetric elevation of the soft palate. No obvious hearing deficits are noted.  Motor:  Muscle bulk is normal.   Tone is normal. Strength is  5 / 5 in all 4 extremities.   Sensory: Sensory testing is intact to pinprick, soft touch and vibration sensation in all 4 extremities.  Coordination: Cerebellar testing reveals good finger-nose-finger and heel-to-shin bilaterally.  Gait and station: Station is normal.  The gait is minimally wide.  Tandem gait is wide.  Romberg is negative.   Reflexes: Deep tendon reflexes are symmetric and normal in the arms and increase in the legs.  However, there is no ankle clonus..   Plantar responses are flexor.    DIAGNOSTIC DATA (LABS, IMAGING, TESTING) - I reviewed patient records, labs, notes, testing and imaging myself where available.  Lab Results  Component Value Date   WBC 4.6 05/05/2022   HGB 12.9 05/05/2022   HCT 37.3 05/05/2022   MCV 88 05/05/2022   PLT 236 05/05/2022      Component Value Date/Time   NA 135 05/05/2022 1050   K 5.0 05/05/2022 1050   CL 93 (L) 05/05/2022 1050   CO2 22 05/05/2022 1050   GLUCOSE 83 05/05/2022 1050   GLUCOSE 84 07/10/2014 0944   BUN 21 05/05/2022 1050   CREATININE 0.74  05/05/2022 1050   CREATININE 0.76 06/27/2014 0915   CALCIUM 9.4 05/05/2022 1050   PROT 6.9 05/05/2022 1050   ALBUMIN 4.3 05/05/2022 1050   AST 19 05/05/2022 1050   ALT 21 05/05/2022 1050   ALKPHOS 110 05/05/2022 1050   BILITOT 0.2 05/05/2022 1050   GFRNONAA 86 04/24/2020 0915   GFRAA 99 04/24/2020 0915    Lab Results  Component Value Date   [id number] 04/24/2020   Lab Results  Component Value Date   TSH 1.300 04/24/2020       ASSESSMENT AND PLAN  Multiple sclerosis (HCC)  Periodic limb movement disorder (PLMD)  Urge incontinence  High risk medication use  TIA (transient ischemic attack) - Plan: US Carotid Bilateral   Continue Ocrevus  labs were great in 04/2022 so I will hold off checking labs today. - will need lab recheck at next visit.   Repeat MRI in 2025.   At some point, we may want to transition to [facility] or another medication.  Because she has multiple plaques on the spinal cord, I think it is too early to consider total discontinuation of medication. We had a long discussion about natural history of MS.  She has several plaques in the spinal cord.  We discussed that some progression with age is likely, especially with existing issues such as balance and gait.  Hopefully  the secondary progressive changes will be minimal to mild.   She had a 1 minute episode of receptive aphasia (she did not talk so unsure if expressive).  This is a possible TIA.  No bruit but ill check Dopplers.   ECHO in 2019 was fine.  If recurs, start ASA or Plavix.  Stay active and exercise as tolerated.  Rtc 6 months  40-minute office visit with the majority of the time spent face-to-face for history and physical, discussion/counseling and decision-making.  Additional time with record review and documentation.   Eddy Liszewski A. Felecia Shelling, MD, [facility] [id number], [id number] AM Certified in Neurology, Clinical Neurophysiology, Sleep Medicine and Neuroimaging  [facility] [address] [place], [place] [address] [phone]

## 2022-11-25 DIAGNOSIS — J4520 Mild intermittent asthma, uncomplicated: Secondary | ICD-10-CM | POA: Diagnosis not present

## 2022-11-25 DIAGNOSIS — R1319 Other dysphagia: Secondary | ICD-10-CM | POA: Diagnosis not present

## 2022-11-25 DIAGNOSIS — J45998 Other asthma: Secondary | ICD-10-CM | POA: Diagnosis not present

## 2022-11-25 DIAGNOSIS — K439 Ventral hernia without obstruction or gangrene: Secondary | ICD-10-CM | POA: Diagnosis not present

## 2022-11-29 ENCOUNTER — Other Ambulatory Visit: Payer: Self-pay | Admitting: Adult Health

## 2022-12-12 ENCOUNTER — Other Ambulatory Visit: Payer: BC Managed Care – PPO

## 2023-01-06 DIAGNOSIS — G35 Multiple sclerosis: Secondary | ICD-10-CM | POA: Diagnosis not present

## 2023-01-08 ENCOUNTER — Ambulatory Visit
Admission: RE | Admit: 2023-01-08 | Discharge: 2023-01-08 | Disposition: A | Discharge status: Home / Self Care | Payer: BC Managed Care – PPO | Source: Ambulatory Visit | Attending: Neurology | Admitting: Neurology

## 2023-01-08 DIAGNOSIS — G459 Transient cerebral ischemic attack, unspecified: Secondary | ICD-10-CM | POA: Diagnosis not present

## 2023-02-02 DIAGNOSIS — Z13220 Encounter for screening for lipoid disorders: Secondary | ICD-10-CM | POA: Diagnosis not present

## 2023-02-02 DIAGNOSIS — R1314 Dysphagia, pharyngoesophageal phase: Secondary | ICD-10-CM | POA: Diagnosis not present

## 2023-02-02 DIAGNOSIS — J3489 Other specified disorders of nose and nasal sinuses: Secondary | ICD-10-CM | POA: Diagnosis not present

## 2023-02-02 DIAGNOSIS — R5383 Other fatigue: Secondary | ICD-10-CM | POA: Diagnosis not present

## 2023-02-02 DIAGNOSIS — I10 Essential (primary) hypertension: Secondary | ICD-10-CM | POA: Diagnosis not present

## 2023-02-23 ENCOUNTER — Ambulatory Visit (INDEPENDENT_AMBULATORY_CARE_PROVIDER_SITE_OTHER): Payer: BC Managed Care – PPO | Admitting: Otolaryngology

## 2023-02-23 ENCOUNTER — Encounter (INDEPENDENT_AMBULATORY_CARE_PROVIDER_SITE_OTHER): Payer: Self-pay | Admitting: Otolaryngology

## 2023-02-23 ENCOUNTER — Other Ambulatory Visit: Payer: Self-pay | Admitting: Adult Health

## 2023-02-23 VITALS — BP 129/77 | HR 64 | Resp 18 | Ht 69.0 in | Wt 283.0 lb

## 2023-02-23 DIAGNOSIS — J4520 Mild intermittent asthma, uncomplicated: Secondary | ICD-10-CM

## 2023-02-23 DIAGNOSIS — J385 Laryngeal spasm: Secondary | ICD-10-CM

## 2023-02-23 DIAGNOSIS — R053 Chronic cough: Secondary | ICD-10-CM | POA: Diagnosis not present

## 2023-02-23 DIAGNOSIS — K219 Gastro-esophageal reflux disease without esophagitis: Secondary | ICD-10-CM

## 2023-02-23 DIAGNOSIS — R1310 Dysphagia, unspecified: Secondary | ICD-10-CM

## 2023-02-23 MED ORDER — FAMOTIDINE 20 MG PO TABS: 20.0000 mg | ORAL_TABLET | Freq: Two times a day (BID) | ORAL | 1 refills | Status: DC

## 2023-02-23 NOTE — Patient Instructions (Signed)
-   try reflux gourmet and start Famotidine  - try rescue breathing technique for your sx (breath in through the nose, breath out through your mouth) - schedule Pulmonary referral appt  - schedule swallow study  - return in 3-4 months

## 2023-02-23 NOTE — Progress Notes (Signed)
ENT CONSULT:  Reason for Consult: dysphagia   HPI: [name] is a 62 y.o. female with hx of asthma, MS, on biologic, hx of HTN, depression and PLMD/OSA, s/p ACDF 3-level R sided approach (C5-C7) several yrs ago, here for 6-8 months of dysphagia and dry cough with throat tightness with the episodes occur.    She starts to cough and her throat constricts, and she has a sensation of throat closing off. She coughs daily, and the cough is dry. She has occasional reflux and self-treats with TUMS (takes once a month). She has large pill dysphagia, denies coughing or choking on foods or fluids. She does feel short of breath when she lies down at night. She has asthma, and uses inhaler as needed. She was diagnosed with asthma 20 yrs ago, and uses albuterol once a week. 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 She is on Losartan/hydrochlorothiazide. No hx of MI or stroke.  No relationship to eating with her sx. She has neck tension left side and it is chronic for her. She did not discuss this with her spine surgeon. Her cough is always dry. No other head or neck procedures in the past. No hx of smoking in the past. No hx of arrhythmia. Records review indicates that she has demyelinating process of the brain and cervical spine C3, C4-5, T1.  She has history of MS with positive findings on MRI brain and C-spine, followed by neurology.  Records Reviewed:  MRI brain 06/2019 Impression   MRI brain (with and without) demonstrating: -Multiple supratentorial and infratentorial chronic demyelinating plaques. -No acute plaques.  No change from 04/21/2018.   MRI c-spine    Abnormal MRI cervical spine (with and without) demonstrating: - Chronic demyelinating plaques at cervical medullary junction, C3, C3-4, and T1 levels.   - No acute plaques. No change from MRI on 04/21/18. - Additional  multi-level degenerative changes as above.      Past Medical History:  Diagnosis Date   Asthma    Back pain    Cardiac/pericardial tamponade 06/11/2014   s/p pericardial window 06/16/2014 per Dr. Maren Beach   Depression    Hypertension    MS (multiple sclerosis) (HCC)    Muscle pain    Obesity    Periodic limb movement disorder (PLMD) 07/15/2016   Pleural effusion, bilateral 06/30/2014   Uveitis    Bilateral    Past Surgical History:  Procedure Laterality Date   CERVICAL FUSION     01-02-21, [place] NS C5-7   microdisectomy  2019   lumbar spine   PERICARDIAL TAP N/A 06/11/2014   Procedure: PERICARDIAL TAP;  Surgeon: Micheline Chapman, MD;  Location[facility] C[facility] LAB;  Service: Cardiovascular;  Laterality: N/A;   SUBXYPHOID PERICARDIAL WINDOW N/A 06/16/2014   Procedure: SUBXYPHOID PERICARDIAL WINDOW;  Surgeon: Kerin Perna, MD;  [facility];  Service: Thoracic;  Laterality: N/A;   TEE WITHOUT CARDIOVERSION N/A 06/16/2014   Procedure: TRANSESOPHAGEAL ECHOCARDIOGRAM (TEE);  Surgeon: Kerin Perna, MD;  Location: [facility] OR;  Service: Thoracic;  Laterality: N/A;   uterine ablasion  2012    Family History  Problem Relation Age of Onset   Breast cancer Mother    Hypertension Mother    Diabetes Mother    Heart attack Father    Epilepsy Father    Endocrine tumor Brother    Multiple sclerosis Neg Hx  Social History:  reports that she has never smoked. She has never used smokeless tobacco. She reports current alcohol use. She reports that she does not use drugs.  Allergies:  Allergies  Allergen Reactions   Penicillins Rash    Medications: I have reviewed the patient's current medications.  The PMH, PSH, Medications, Allergies, and SH were reviewed and updated.  ROS: Constitutional: Negative for fever, weight loss and weight gain. Cardiovascular: Negative for chest pain and dyspnea on exertion. Respiratory: Is not experiencing shortness of breath at  rest. Gastrointestinal: Negative for nausea and vomiting. Neurological: Negative for headaches. Psychiatric: The patient is not nervous/anxiou  Blood pressure 129/77, pulse 64, resp. rate 18, height 5\' 9"  (1.753 m), weight 283 lb (128.4 kg), SpO2 100%.  PHYSICAL EXAM:  Exam: General: Well-developed, well-nourished Communication and Voice: Clear pitch and clarity Respiratory Respiratory effort: Equal inspiration and expiration without stridor Cardiovascular Peripheral Vascular: Warm extremities with equal color/perfusion Eyes: No nystagmus with equal extraocular motion bilaterally Neuro/Psych/Balance: Patient oriented to person, place, and time; Appropriate mood and affect; Gait is intact with no imbalance; Cranial nerves I-XII are intact Head and Face Inspection: Normocephalic and atraumatic without mass or lesion Palpation: Facial skeleton intact without bony stepoffs Salivary Glands: No mass or tenderness Facial Strength: Facial motility symmetric and full bilaterally ENT Pinna: External ear intact and fully developed External canal: Canal is patent with intact skin Tympanic Membrane: Clear and mobile External Nose: No scar or anatomic deformity Internal Nose: Septum intact and midline. No edema, polyp, or rhinorrhea Lips, Teeth, and gums: Mucosa and teeth intact and viable TMJ: No pain to palpation with full mobility Oral cavity/oropharynx: No erythema or exudate, no lesions present Nasopharynx: No mass or lesion with intact mucosa Hypopharynx: Intact mucosa without pooling of secretions Larynx Glottic: Full true vocal cord mobility without lesion or mass Supraglottic: Normal appearing epiglottis and AE folds Interarytenoid Space:: Moderate pachydermia and edema Subglottic Space: Patent without lesion or edema Neck Neck and Trachea: Midline trachea without mass or lesion Thyroid: No mass or nodularity Lymphatics: No lymphadenopathy  Procedure: Preoperative diagnosis:  dysphagia and chronic cough   Postoperative diagnosis:   Same + GERD/LPR  Procedure: Flexible fiberoptic laryngoscopy  Surgeon: Ashok Croon, MD  Anesthesia: Topical lidocaine and Afrin Complications: None Condition is stable throughout exam  Indications and consent:  The patient presents to the clinic with Indirect laryngoscopy view was incomplete. Thus it was recommended that they undergo a flexible fiberoptic laryngoscopy. All of the risks, benefits, and potential complications were reviewed with the patient preoperatively and verbal informed consent was obtained.  Procedure: The patient was seated upright in the clinic. Topical lidocaine and Afrin were applied to the nasal cavity. After adequate anesthesia had occurred, I then proceeded to pass the flexible telescope into the nasal cavity. The nasal cavity was patent without rhinorrhea or polyp. The nasopharynx was also patent without mass or lesion. The base of tongue was visualized and was normal. There were no signs of pooling of secretions in the piriform sinuses. The true vocal folds were mobile bilaterally. There were no signs of glottic or supraglottic mucosal lesion or mass. There was moderate interarytenoid pachydermia and post cricoid edema. The telescope was then slowly withdrawn and the patient tolerated the procedure throughout.  Studies Reviewed: none  Assessment/Plan: Encounter Diagnoses  Name Primary?   Dysphagia, unspecified type Yes   Chronic cough    Gastroesophageal reflux disease without esophagitis    Mild intermittent asthma, unspecified whether complicated  Laryngospasm    62 year old female with history of MS, asthma, and history of right-sided approach ACDF several years ago who is here for initial evaluation of episodes of throat tightness, dry cough and trouble swallowing pills.  On exam including flexible laryngoscopy no evidence of masses tumors or mucosal changes and there was no evidence of pooled  secretions in the piriform or postcricoid area to suggest severe problem with swallowing.  Cranial nerve exam was unremarkable.  She did have evidence of GERD LPR on flexible scope exam and we will initiate diet lifestyle changes as well as famotidine reflux Gourmet combination to control that.  My differential diagnosis for symptoms includes oropharyngeal versus esophageal dysphagia versus laryngospasm in the setting of untreated GERD LPR versus VCD/PVFMD in the setting of history of asthma and untreated GERD LPR.  Since she has never seen pulmonary for her symptoms we will make referral to ensure she had asthma workup and her asthma if she has a history of adequately.  I counseled the patient on rescue breathing techniques that she can utilize during episodes to see if it gives her any relief.   - try reflux gourmet and start Famotidine, diet/lifestyle changes to minimize reflux - try rescue breathing technique for your sx (breath in through the nose, breath out through your mouth) - Pulmonary referral  - schedule swallow study - MBS/esophagram - return in 3-4 months   Thank you for allowing me to participate in the care of this patient. Please do not hesitate to contact me with any questions or concerns.   Ashok Croon, MD Otolaryngology [facility] Phone: [phone] Fax: [phone]    02/23/2023, 8:59 AM

## 2023-02-24 ENCOUNTER — Telehealth: Payer: BC Managed Care – PPO | Admitting: Family Medicine

## 2023-02-24 ENCOUNTER — Telehealth: Payer: BC Managed Care – PPO | Admitting: Physician Assistant

## 2023-02-24 DIAGNOSIS — U071 COVID-19: Secondary | ICD-10-CM | POA: Diagnosis not present

## 2023-02-24 MED ORDER — NIRMATRELVIR/RITONAVIR (PAXLOVID)TABLET: 3.0000 | ORAL_TABLET | Freq: Two times a day (BID) | ORAL | 0 refills | Status: AC

## 2023-02-24 NOTE — Progress Notes (Signed)
Virtual Visit Consent   [name], you are scheduled for a virtual visit with a [facility] provider today. Just as with appointments in the office, your consent must be obtained to participate. Your consent will be active for this visit and any virtual visit you may have with one of our providers in the next 365 days. If you have a MyChart account, a copy of this consent can be sent to you electronically.  As this is a virtual visit, video technology does not allow for your provider to perform a traditional examination. This may limit your provider's ability to fully assess your condition. If your provider identifies any concerns that need to be evaluated in person or the need to arrange testing (such as labs, EKG, etc.), we will make arrangements to do so. Although advances in technology are sophisticated, we cannot ensure that it will always work on either your end or our end. If the connection with a video visit is poor, the visit may have to be switched to a telephone visit. With either a video or telephone visit, we are not always able to ensure that we have a secure connection.  By engaging in this virtual visit, you consent to the provision of healthcare and authorize for your insurance to be billed (if applicable) for the services provided during this visit. Depending on your insurance coverage, you may receive a charge related to this service.  I need to obtain your verbal consent now. Are you willing to proceed with your visit today? [name] has provided verbal consent on 02/24/2023 for a virtual visit (video or telephone). Freddy Finner, NP  Date: 02/24/2023 12:52 PM  Virtual Visit via Video Note   I, Freddy Finner, connected with  [name]  ([id number], September 26, 1960) on 02/24/23 at  1:00 PM EDT by a video-enabled telemedicine application and verified that I am speaking with the correct person using two identifiers.  Location: Patient: Virtual Visit Location Patient:  Home Provider: Virtual Visit Location Provider: Home Office   I discussed the limitations of evaluation and management by telemedicine and the availability of in person appointments. The patient expressed understanding and agreed to proceed.    History of Present Illness: [name] is a 62 y.o. who identifies as a female who was assigned female at birth, and is being seen today for covid positive.  Onset was 1 day ago- congestion and runny nose Associated symptoms are throat pain, headache, PND, fever t max 99.4, chills Modifying factors are theraflu helped with stuffiness and fever some Denies chest pain, shortness of breath  Exposure to sick contacts- unknown COVID test: positive home test yesterday   History of MS and possibly asthma- on Ocrevus IV    Problems:  Patient Active Problem List   Diagnosis Date Noted   Cervical radiculopathy 10/28/2021   High risk medication use 10/28/2021   Cervical disc disorder with radiculopathy 10/19/2017   Periodic limb movement disorder (PLMD) 07/15/2016   Pleural effusion, bilateral 06/30/2014   Shortness of breath 06/29/2014   Hypertension 06/29/2014   Pericarditis 06/29/2014   Pericardial effusion 06/16/2014   Pericardial effusion, acute 06/14/2014   Cardiac/pericardial tamponade 06/11/2014   Cardiac tamponade 06/11/2014   Multiple sclerosis (HCC) 11/21/2013    Allergies:  Allergies  Allergen Reactions   Penicillins Rash   Medications:  Current Outpatient Medications:    ALPRAZolam (XANAX) 0.5 MG tablet, Take 1 tablet 1 hour prior to MRI and another if needed when arrives at site. (  Pt must have driver), Disp: 2 tablet, Rfl: 0   Armodafinil 150 MG tablet, TAKE 1 TABLET BY MOUTH EVERY DAY (Patient taking differently: as needed. 1/2 tablet has needed), Disp: 90 tablet, Rfl: 0   atorvastatin (LIPITOR) 10 MG tablet, 10 mg daily., Disp: , Rfl:    buPROPion (WELLBUTRIN XL) 150 MG 24 hr tablet, Take 150 mg by mouth every morning.,  Disp: , Rfl:    cyclobenzaprine (FLEXERIL) 10 MG tablet, Take 10 mg by mouth every 8 (eight) hours as needed. (Patient not taking: Reported on 11/04/2022), Disp: , Rfl:    escitalopram (LEXAPRO) 10 MG tablet, Take 5 mg by mouth daily., Disp: , Rfl:    famotidine (PEPCID) 20 MG tablet, Take 1 tablet (20 mg total) by mouth 2 (two) times daily., Disp: 30 tablet, Rfl: 1   hydrochlorothiazide (HYDRODIURIL) 25 MG tablet, Take 25 mg by mouth daily., Disp: , Rfl:    ibuprofen (ADVIL,MOTRIN) 200 MG tablet, Take 400 mg by mouth every 6 (six) hours as needed., Disp: , Rfl:    losartan (COZAAR) 50 MG tablet, Take 1 tablet by mouth daily., Disp: , Rfl:    meloxicam (MOBIC) 15 MG tablet, Take 1 tablet (15 mg total) by mouth daily., Disp: 30 tablet, Rfl: 3   methylPREDNISolone (MEDROL DOSEPAK) 4 MG TBPK tablet, 6 day dose pack - take as directed, Disp: 21 tablet, Rfl: 0   Ocrelizumab (OCREVUS IV), Inject into the vein every 6 (six) months. 1st dose 11-2020, Disp: , Rfl:    pantoprazole (PROTONIX) 20 MG tablet, 20 mg daily., Disp: , Rfl:    pramipexole (MIRAPEX) 0.25 MG tablet, TAKE 1 TABLET BY MOUTH 2 TIMES DAILY AS NEEDED., Disp: 180 tablet, Rfl: 2   Pramipexole Dihydrochloride 0.375 MG TB24, TAKE 1 TABLET (0.375 MG TOTAL) BY MOUTH AT BEDTIME., Disp: 90 tablet, Rfl: 3   prednisoLONE acetate (PRED FORTE) 1 % ophthalmic suspension, As needed, Disp: , Rfl:    VENTOLIN HFA 108 (90 Base) MCG/ACT inhaler, , Disp: , Rfl:   Observations/Objective: Patient is well-developed, well-nourished in no acute distress.  Resting comfortably  at home.  Head is normocephalic, atraumatic.  No labored breathing.  Speech is clear and coherent with logical content.  Patient is alert and oriented at baseline.    Assessment and Plan:  1. COVID  - nirmatrelvir/ritonavir (PAXLOVID) 20 x 150 MG & 10 x 100MG  TABS; Take 3 tablets by mouth 2 (two) times daily for 5 days. (Take nirmatrelvir 150 mg two tablets twice daily for 5 days and  ritonavir 100 mg one tablet twice daily for 5 days) Patient GFR is 84 June 2024  Dispense: 30 tablet; Refill: 0  - Continue OTC symptomatic management of choice - Info for COVID sent on AVS as well - Take prescribed medications as directed - Push fluids - Rest as needed - Discussed return precautions and when to seek in-person evaluation, sent via AVS as well  Reviewed side effects, risks and benefits of medication.    Patient acknowledged agreement and understanding of the plan.   Past Medical, Surgical, Social History, Allergies, and Medications have been Reviewed.    Follow Up Instructions: I discussed the assessment and treatment plan with the patient. The patient was provided an opportunity to ask questions and all were answered. The patient agreed with the plan and demonstrated an understanding of the instructions.  A copy of instructions were sent to the patient via MyChart unless otherwise noted below.     The  patient was advised to call back or seek an in-person evaluation if the symptoms worsen or if the condition fails to improve as anticipated.  Time:  I spent 10 minutes with the patient via telehealth technology discussing the above problems/concerns.    Freddy Finner, NP

## 2023-02-24 NOTE — Progress Notes (Signed)
   Thank you for the details you included in the comment boxes. Those details are very helpful in determining the best course of treatment for you and help [place] to provide the best care.Because you are COVID positive and have risk factors where a discussion of antivirals should be had, we recommend that you convert this visit to a video visit in order for the provider to better assess what is going on.  The provider will be able to give you a more accurate diagnosis and treatment plan if we can more freely discuss your symptoms and with the addition of a virtual examination.   If you convert to a video visit, we will bill your insurance (similar to an office visit) and you will not be charged for this e-Visit. You will be able to stay at home and speak with the first available [facility] advanced practice provider. The link to do a video visit is in the drop down Menu tab of your Welcome screen in MyChart.

## 2023-02-24 NOTE — Patient Instructions (Addendum)
[name], thank you for joining Freddy Finner, NP for today's virtual visit.  While this provider is not your primary care provider (PCP), if your PCP is located in our provider database this encounter information will be shared with them immediately following your visit.   A [facility] MyChart account gives you access to today's visit and all your visits, tests, and labs performed at [facility] " click here if you don't have a [facility] MyChart account or go to [email or web address]  Consent: (Patient) [name] provided verbal consent for this virtual visit at the beginning of the encounter.  Current Medications:  Current Outpatient Medications:    nirmatrelvir/ritonavir (PAXLOVID) 20 x 150 MG & 10 x 100MG  TABS, Take 3 tablets by mouth 2 (two) times daily for 5 days. (Take nirmatrelvir 150 mg two tablets twice daily for 5 days and ritonavir 100 mg one tablet twice daily for 5 days) Patient GFR is 84 June 2024, Disp: 30 tablet, Rfl: 0   ALPRAZolam (XANAX) 0.5 MG tablet, Take 1 tablet 1 hour prior to MRI and another if needed when arrives at site. (Pt must have driver), Disp: 2 tablet, Rfl: 0   Armodafinil 150 MG tablet, TAKE 1 TABLET BY MOUTH EVERY DAY (Patient taking differently: as needed. 1/2 tablet has needed), Disp: 90 tablet, Rfl: 0   atorvastatin (LIPITOR) 10 MG tablet, 10 mg daily., Disp: , Rfl:    buPROPion (WELLBUTRIN XL) 150 MG 24 hr tablet, Take 150 mg by mouth every morning., Disp: , Rfl:    cyclobenzaprine (FLEXERIL) 10 MG tablet, Take 10 mg by mouth every 8 (eight) hours as needed. (Patient not taking: Reported on 11/04/2022), Disp: , Rfl:    escitalopram (LEXAPRO) 10 MG tablet, Take 5 mg by mouth daily., Disp: , Rfl:    famotidine (PEPCID) 20 MG tablet, Take 1 tablet (20 mg total) by mouth 2 (two) times daily., Disp: 30 tablet, Rfl: 1   hydrochlorothiazide (HYDRODIURIL) 25 MG tablet, Take 25 mg by mouth daily., Disp: , Rfl:    ibuprofen (ADVIL,MOTRIN)  200 MG tablet, Take 400 mg by mouth every 6 (six) hours as needed., Disp: , Rfl:    losartan (COZAAR) 50 MG tablet, Take 1 tablet by mouth daily., Disp: , Rfl:    meloxicam (MOBIC) 15 MG tablet, Take 1 tablet (15 mg total) by mouth daily., Disp: 30 tablet, Rfl: 3   methylPREDNISolone (MEDROL DOSEPAK) 4 MG TBPK tablet, 6 day dose pack - take as directed, Disp: 21 tablet, Rfl: 0   Ocrelizumab (OCREVUS IV), Inject into the vein every 6 (six) months. 1st dose 11-2020, Disp: , Rfl:    pantoprazole (PROTONIX) 20 MG tablet, 20 mg daily., Disp: , Rfl:    pramipexole (MIRAPEX) 0.25 MG tablet, TAKE 1 TABLET BY MOUTH 2 TIMES DAILY AS NEEDED., Disp: 180 tablet, Rfl: 2   Pramipexole Dihydrochloride 0.375 MG TB24, TAKE 1 TABLET (0.375 MG TOTAL) BY MOUTH AT BEDTIME., Disp: 90 tablet, Rfl: 3   prednisoLONE acetate (PRED FORTE) 1 % ophthalmic suspension, As needed, Disp: , Rfl:    VENTOLIN HFA 108 (90 Base) MCG/ACT inhaler, , Disp: , Rfl:    Medications ordered in this encounter:  Meds ordered this encounter  Medications   nirmatrelvir/ritonavir (PAXLOVID) 20 x 150 MG & 10 x 100MG  TABS    Sig: Take 3 tablets by mouth 2 (two) times daily for 5 days. (Take nirmatrelvir 150 mg two tablets twice daily for 5 days and ritonavir 100 mg one  tablet twice daily for 5 days) Patient GFR is 84 June 2024    Dispense:  30 tablet    Refill:  0    Order Specific Question:   Supervising Provider    Answer:   Merrilee Jansky [[id number]]     *If you need refills on other medications prior to your next appointment, please contact your pharmacy*  Follow-Up: Call back or seek an in-person evaluation if the symptoms worsen or if the condition fails to improve as anticipated.  [facility] Virtual Care [phone]  Care Instructions:  - Continue OTC symptomatic management of choice  - Take prescribed medications as directed - Push fluids - Rest as needed   Isolation Instructions: You are to isolate at home until you  have been fever free for at least 24 hours without a fever-reducing medication, and symptoms have been steadily improving for 24 hours. At that time,  you can end isolation but need to mask for an additional 5 days.   If you must be around other household members who do not have symptoms, you need to make sure that both you and the family members are masking consistently with a high-quality mask.  If you note any worsening of symptoms despite treatment, please seek an in-person evaluation ASAP. If you note any significant shortness of breath or any chest pain, please seek ER evaluation. Please do not delay care!   COVID-19: What to Do if You Are Sick If you test positive and are an older adult or someone who is at high risk of getting very sick from COVID-19, treatment may be available. Contact a healthcare provider right away after a positive test to determine if you are eligible, even if your symptoms are mild right now. You can also visit a Test to Treat location and, if eligible, receive a prescription from a provider. Don't delay: Treatment must be started within the first few days to be effective. If you have a fever, cough, or other symptoms, you might have COVID-19. Most people have mild illness and are able to recover at home. If you are sick: Keep track of your symptoms. If you have an emergency warning sign (including trouble breathing), call 911. Steps to help prevent the spread of COVID-19 if you are sick If you are sick with COVID-19 or think you might have COVID-19, follow the steps below to care for yourself and to help protect other people in your home and community. Stay home except to get medical care Stay home. Most people with COVID-19 have mild illness and can recover at home without medical care. Do not leave your home, except to get medical care. Do not visit public areas and do not go to places where you are unable to wear a mask. Take care of yourself. Get rest and stay  hydrated. Take over-the-counter medicines, such as acetaminophen, to help you feel better. Stay in touch with your doctor. Call before you get medical care. Be sure to get care if you have trouble breathing, or have any other emergency warning signs, or if you think it is an emergency. Avoid public transportation, ride-sharing, or taxis if possible. Get tested If you have symptoms of COVID-19, get tested. While waiting for test results, stay away from others, including staying apart from those living in your household. Get tested as soon as possible after your symptoms start. Treatments may be available for people with COVID-19 who are at risk for becoming very sick. Don't delay: Treatment must  be started early to be effective--some treatments must begin within 5 days of your first symptoms. Contact your healthcare provider right away if your test result is positive to determine if you are eligible. Self-tests are one of several options for testing for the virus that causes COVID-19 and may be more convenient than laboratory-based tests and point-of-care tests. Ask your healthcare provider or your local health department if you need help interpreting your test results. You can visit your state, tribal, local, and territorial health department's website to look for the latest local information on testing sites. Separate yourself from other people As much as possible, stay in a specific room and away from other people and pets in your home. If possible, you should use a separate bathroom. If you need to be around other people or animals in or outside of the home, wear a well-fitting mask. Tell your close contacts that they may have been exposed to COVID-19. An infected person can spread COVID-19 starting 48 hours (or 2 days) before the person has any symptoms or tests positive. By letting your close contacts know they may have been exposed to COVID-19, you are helping to protect everyone. See COVID-19 and  Animals if you have questions about pets. If you are diagnosed with COVID-19, someone from the health department may call you. Answer the call to slow the spread. Monitor your symptoms Symptoms of COVID-19 include fever, cough, or other symptoms. Follow care instructions from your healthcare provider and local health department. Your local health authorities may give instructions on checking your symptoms and reporting information. When to seek emergency medical attention Look for emergency warning signs* for COVID-19. If someone is showing any of these signs, seek emergency medical care immediately: Trouble breathing Persistent pain or pressure in the chest New confusion Inability to wake or stay awake Pale, gray, or blue-colored skin, lips, or nail beds, depending on skin tone *This list is not all possible symptoms. Please call your medical provider for any other symptoms that are severe or concerning to you. Call 911 or call ahead to your local emergency facility: Notify the operator that you are seeking care for someone who has or may have COVID-19. Call ahead before visiting your doctor Call ahead. Many medical visits for routine care are being postponed or done by phone or telemedicine. If you have a medical appointment that cannot be postponed, call your doctor's office, and tell them you have or may have COVID-19. This will help the office protect themselves and other patients. If you are sick, wear a well-fitting mask You should wear a mask if you must be around other people or animals, including pets (even at home). Wear a mask with the best fit, protection, and comfort for you. You don't need to wear the mask if you are alone. If you can't put on a mask (because of trouble breathing, for example), cover your coughs and sneezes in some other way. Try to stay at least 6 feet away from other people. This will help protect the people around you. Masks should not be placed on young  children under age 33 years, anyone who has trouble breathing, or anyone who is not able to remove the mask without help. Cover your coughs and sneezes Cover your mouth and nose with a tissue when you cough or sneeze. Throw away used tissues in a lined trash can. Immediately wash your hands with soap and water for at least 20 seconds. If soap and water are not available,  clean your hands with an alcohol-based hand sanitizer that contains at least 60% alcohol. Clean your hands often Wash your hands often with soap and water for at least 20 seconds. This is especially important after blowing your nose, coughing, or sneezing; going to the bathroom; and before eating or preparing food. Use hand sanitizer if soap and water are not available. Use an alcohol-based hand sanitizer with at least 60% alcohol, covering all surfaces of your hands and rubbing them together until they feel dry. Soap and water are the best option, especially if hands are visibly dirty. Avoid touching your eyes, nose, and mouth with unwashed hands. Handwashing Tips Avoid sharing personal household items Do not share dishes, drinking glasses, cups, eating utensils, towels, or bedding with other people in your home. Wash these items thoroughly after using them with soap and water or put in the dishwasher. Clean surfaces in your home regularly Clean and disinfect high-touch surfaces (for example, doorknobs, tables, handles, light switches, and countertops) in your "sick room" and bathroom. In shared spaces, you should clean and disinfect surfaces and items after each use by the person who is ill. If you are sick and cannot clean, a caregiver or other person should only clean and disinfect the area around you (such as your bedroom and bathroom) on an as needed basis. Your caregiver/other person should wait as long as possible (at least several hours) and wear a mask before entering, cleaning, and disinfecting shared spaces that you  use. Clean and disinfect areas that may have blood, stool, or body fluids on them. Use household cleaners and disinfectants. Clean visible dirty surfaces with household cleaners containing soap or detergent. Then, use a household disinfectant. Use a product from [employer] List N: Disinfectants for Coronavirus (COVID-19). Be sure to follow the instructions on the label to ensure safe and effective use of the product. Many products recommend keeping the surface wet with a disinfectant for a certain period of time (look at "contact time" on the product label). You may also need to wear personal protective equipment, such as gloves, depending on the directions on the product label. Immediately after disinfecting, wash your hands with soap and water for 20 seconds. For completed guidance on cleaning and disinfecting your home, visit Complete Disinfection Guidance. Take steps to improve ventilation at home Improve ventilation (air flow) at home to help prevent from spreading COVID-19 to other people in your household. Clear out COVID-19 virus particles in the air by opening windows, using air filters, and turning on fans in your home. Use this interactive tool to learn how to improve air flow in your home. When you can be around others after being sick with COVID-19 Deciding when you can be around others is different for different situations. Find out when you can safely end home isolation. For any additional questions about your care, contact your healthcare provider or state or local health department. 10/30/2020 Content source: [facility] ([facility]), Division of Viral Diseases This information is not intended to replace advice given to you by your health care provider. Make sure you discuss any questions you have with your health care provider. Document Revised: 12/13/2020 Document Reviewed: 12/13/2020 Elsevier Patient Education  2022 [employer].     If  you have been instructed to have an in-person evaluation today at a local Urgent Care facility, please use the link below. It will take you to a list of all of our available [facility], including  address, phone number and hours of operation. Please do not delay care.  [facility]  If you or a family member do not have a primary care provider, use the link below to schedule a visit and establish care. When you choose a [facility] primary care physician or advanced practice provider, you gain a long-term partner in health. Find a Primary Care Provider  Learn more about [facility]'s in-office and virtual care options: [facility] - Get Care Now

## 2023-03-03 ENCOUNTER — Telehealth (HOSPITAL_COMMUNITY): Payer: Self-pay | Admitting: *Deleted

## 2023-03-03 NOTE — Telephone Encounter (Signed)
Attempted to contact patient to schedule OP MBS and esophagram. Left VM. RKEEL

## 2023-03-05 ENCOUNTER — Other Ambulatory Visit (HOSPITAL_COMMUNITY): Payer: Self-pay | Admitting: *Deleted

## 2023-03-05 DIAGNOSIS — R1310 Dysphagia, unspecified: Secondary | ICD-10-CM

## 2023-03-12 DIAGNOSIS — R7989 Other specified abnormal findings of blood chemistry: Secondary | ICD-10-CM | POA: Diagnosis not present

## 2023-03-12 DIAGNOSIS — H2013 Chronic iridocyclitis, bilateral: Secondary | ICD-10-CM | POA: Diagnosis not present

## 2023-03-12 DIAGNOSIS — I10 Essential (primary) hypertension: Secondary | ICD-10-CM | POA: Diagnosis not present

## 2023-03-17 ENCOUNTER — Ambulatory Visit: Payer: BC Managed Care – PPO | Admitting: Podiatry

## 2023-03-17 ENCOUNTER — Encounter: Payer: Self-pay | Admitting: Podiatry

## 2023-03-17 DIAGNOSIS — M19079 Primary osteoarthritis, unspecified ankle and foot: Secondary | ICD-10-CM

## 2023-03-17 DIAGNOSIS — M7661 Achilles tendinitis, right leg: Secondary | ICD-10-CM

## 2023-03-17 DIAGNOSIS — M722 Plantar fascial fibromatosis: Secondary | ICD-10-CM

## 2023-03-17 DIAGNOSIS — M7662 Achilles tendinitis, left leg: Secondary | ICD-10-CM

## 2023-03-17 DIAGNOSIS — M2011 Hallux valgus (acquired), right foot: Secondary | ICD-10-CM

## 2023-03-17 MED ORDER — MELOXICAM 15 MG PO TABS: 15.0000 mg | ORAL_TABLET | Freq: Every day | ORAL | 3 refills | Status: DC

## 2023-03-17 NOTE — Progress Notes (Signed)
She presents today for follow-up of her Achilles tendinitis of her left foot that he was diagnosed with burning otolithic to her leg majority of her pain however is located over the medial longitudinal arch.  She is also complaining of pain in the first metatarsophalangeal joint of the right foot with mild hallux valgus deformity.  Injection: Also stable at 9 x 3 I reviewed her past medical history medications allergies surgery social history.  She has mild flexible hammertoe deformities.  This is resulting in some discoloration of her second nail plate.  Also some thickening.  She has pain on palpation of her Achilles with a nodule that is nonpulsatile in the Achilles more likely no tear.  She also has tenderness in the midfoot in particular medial external arch of the left foot with hallux valgus forming on the right foot.  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  Plan: Discussed etiology pathology and surgical therapies at this point I think the best thing for her would be to get her started back on her meloxicam and have orthotics made for her.  We will schedule those come up next month.  Follow-up with me once those come in.

## 2023-03-18 ENCOUNTER — Ambulatory Visit (HOSPITAL_COMMUNITY)
Admission: RE | Admit: 2023-03-18 | Discharge: 2023-03-18 | Disposition: A | Discharge status: Home / Self Care | Payer: BC Managed Care – PPO | Source: Ambulatory Visit | Attending: Otolaryngology | Admitting: Otolaryngology

## 2023-03-18 ENCOUNTER — Ambulatory Visit (HOSPITAL_COMMUNITY)
Admission: RE | Admit: 2023-03-18 | Discharge: 2023-03-18 | Disposition: A | Discharge status: Home / Self Care | Payer: BC Managed Care – PPO | Source: Ambulatory Visit | Attending: Internal Medicine | Admitting: Internal Medicine

## 2023-03-18 ENCOUNTER — Ambulatory Visit (HOSPITAL_COMMUNITY): Admission: RE | Admit: 2023-03-18 | Payer: BC Managed Care – PPO | Source: Ambulatory Visit

## 2023-03-18 ENCOUNTER — Other Ambulatory Visit (INDEPENDENT_AMBULATORY_CARE_PROVIDER_SITE_OTHER): Payer: Self-pay | Admitting: Otolaryngology

## 2023-03-18 DIAGNOSIS — R1310 Dysphagia, unspecified: Secondary | ICD-10-CM

## 2023-03-18 DIAGNOSIS — J4520 Mild intermittent asthma, uncomplicated: Secondary | ICD-10-CM

## 2023-03-18 DIAGNOSIS — R053 Chronic cough: Secondary | ICD-10-CM | POA: Insufficient documentation

## 2023-03-18 DIAGNOSIS — K219 Gastro-esophageal reflux disease without esophagitis: Secondary | ICD-10-CM

## 2023-03-18 DIAGNOSIS — J385 Laryngeal spasm: Secondary | ICD-10-CM

## 2023-03-18 NOTE — Therapy (Signed)
Modified Barium Swallow Study  Patient Details  Name: [name] MRN: [id number] Date of Birth: [birth year]  Today's Date: 03/18/2023  Modified Barium Swallow completed.  Full report located under Chart Review in the Imaging Section.  History of Present Illness [name] is a 62 y.o. female with PMH: MS, asthma, depression, HTN, muscle pain, back pain, periodic limb movement disorder, ACDF (2 years ago). She presented to this OP MBS secondary to concerns she brought up with ENT during an appointment: "Patient reports for the last 6 months, she's noticed she has been choking. This occurs with both solids and liquids. She does endorse sinus drainage, phlegm production and coughs/chokes on phlegm as well. Taking antihistamine daily. Denies associated regurgitation of food, pain, GERD symptoms. She does endorse a sensation of clenching/tightening in her throat." MBS and Esophagram were both ordered. During interview, she told SLP that when she looks down/head tilted downward, she will feel that her throat is constricted, if she has a pillow or something similar up against her throat she will have the choking feeling. She endorses "a lot of itching and pain on right side of throat. She also stated that if she chokes when she is eating, this will cause her throat to constrict. She denied any symptoms of dysphagia or constriction of throat immediately following C5-7 ACDF which was two years ago.    Clinical Impression Patient presents with what appears to be a primary structural dysphagia as per this Modified Barium Swallow study. With head in neutral position, all solid and liquid consistencies of barium and 13mm barium tablet transited through pharynx, UES and upper esophagus without difficulty. As patient c/o of feeling like she is choking when she has her head tilted down (such as when she is in bed reading), SLP instructed her to perform a chin tuck posture then swallow nectar thick liquids. The result  was that her epiglottis came into contact with posterior pharyngeal wall, effectively closing off pharynx and preventing barium transit with majority of barium remaining above epiglottis in vallecular space. Appearance of posterior pharyngeal wall bulging at level of ACDF hardware, however patient denies any dysphagia after surgery and reports her symptoms started about 6 months ago. When she returned head to neutral position, she was able to then swallow the barium that had collected in vallecular space without difficulty. This was done twice with same results. No penetration or aspiration seen with any consistency of barium. SLP recommended patient try to keep head in neutral position, especially with PO intake. She seems to have excessive phlegm in pharynx per her report which would explain feeling of choking with head tilted downward in absence of any PO's. Factors that may increase risk of adverse event in presence of aspiration [employer]ye [employer]aks [employer] Clearance Coots 2021):    Swallow Evaluation Recommendations Recommendations: PO diet PO Diet Recommendation: Regular;Thin liquids (Level 0) Medication Administration: Whole meds with liquid Supervision: Patient able to self-feed Postural changes:  (keep head in neutral position) Recommended consults: Other(comment) (continue to f/u with ENT)     Angela Nevin, MA, CCC-SLP Speech Therapy

## 2023-03-25 ENCOUNTER — Ambulatory Visit: Payer: BC Managed Care – PPO

## 2023-03-25 NOTE — Progress Notes (Signed)
Orthotic eval   Patient was seen, measured for custom molded foot orthotics  Patient will benefit from CFO's as they will help provide total contact to MLA's helping to better distribute body weight across BIL feet greater reducing plantar pressure and pain and to also encourage FF and RF alignment heel lifts to be added to reduce stress of achilles and reduce pain  Patient was scanned items to be ordered and fit when in  Financial form signed Addison Bailey Cped, CFo, CFm

## 2023-04-06 DIAGNOSIS — E559 Vitamin D deficiency, unspecified: Secondary | ICD-10-CM | POA: Diagnosis not present

## 2023-04-06 DIAGNOSIS — E611 Iron deficiency: Secondary | ICD-10-CM | POA: Diagnosis not present

## 2023-04-06 DIAGNOSIS — Z0000 Encounter for general adult medical examination without abnormal findings: Secondary | ICD-10-CM | POA: Diagnosis not present

## 2023-04-06 DIAGNOSIS — E538 Deficiency of other specified B group vitamins: Secondary | ICD-10-CM | POA: Diagnosis not present

## 2023-04-06 DIAGNOSIS — I10 Essential (primary) hypertension: Secondary | ICD-10-CM | POA: Diagnosis not present

## 2023-04-29 DIAGNOSIS — H26492 Other secondary cataract, left eye: Secondary | ICD-10-CM | POA: Diagnosis not present

## 2023-05-13 DIAGNOSIS — H26492 Other secondary cataract, left eye: Secondary | ICD-10-CM | POA: Diagnosis not present

## 2023-05-25 ENCOUNTER — Telehealth: Payer: BC Managed Care – PPO | Admitting: Emergency Medicine

## 2023-05-25 ENCOUNTER — Ambulatory Visit (INDEPENDENT_AMBULATORY_CARE_PROVIDER_SITE_OTHER): Payer: BC Managed Care – PPO | Admitting: Otolaryngology

## 2023-05-25 DIAGNOSIS — R42 Dizziness and giddiness: Secondary | ICD-10-CM

## 2023-05-25 DIAGNOSIS — M7989 Other specified soft tissue disorders: Secondary | ICD-10-CM

## 2023-05-25 DIAGNOSIS — M79662 Pain in left lower leg: Secondary | ICD-10-CM | POA: Diagnosis not present

## 2023-05-25 NOTE — Progress Notes (Signed)
Virtual Visit Consent   [name], you are scheduled for a virtual visit with a [facility] provider today. Just as with appointments in the office, your consent must be obtained to participate. Your consent will be active for this visit and any virtual visit you may have with one of our providers in the next 365 days. If you have a MyChart account, a copy of this consent can be sent to you electronically.  As this is a virtual visit, video technology does not allow for your provider to perform a traditional examination. This may limit your provider's ability to fully assess your condition. If your provider identifies any concerns that need to be evaluated in person or the need to arrange testing (such as labs, EKG, etc.), we will make arrangements to do so. Although advances in technology are sophisticated, we cannot ensure that it will always work on either your end or our end. If the connection with a video visit is poor, the visit may have to be switched to a telephone visit. With either a video or telephone visit, we are not always able to ensure that we have a secure connection.  By engaging in this virtual visit, you consent to the provision of healthcare and authorize for your insurance to be billed (if applicable) for the services provided during this visit. Depending on your insurance coverage, you may receive a charge related to this service.  I need to obtain your verbal consent now. Are you willing to proceed with your visit today? [name] has provided verbal consent on 05/25/2023 for a virtual visit (video or telephone). [name], NP  Date: 05/25/2023 7:01 PM  Virtual Visit via Video Note   I, [name], connected with  [name]  ([id number], [birth year]) on 05/25/23 at  4:00 PM EDT by a video-enabled telemedicine application and verified that I am speaking with the correct person using two identifiers.  Location: Patient: Virtual Visit Location Patient:  Home Provider: Virtual Visit Location Provider: Home Office   I discussed the limitations of evaluation and management by telemedicine and the availability of in person appointments. The patient expressed understanding and agreed to proceed.    History of Present Illness: [name] is a 62 y.o. who identifies as a female who was assigned female at birth, and is being seen today for c/o dizziness when in large, open spaces and walking around, no dizziness when she is sitting. Has had similar sx in the past, saw ENT who recommended Mucinex. Pt has been traveling recently and forgot her mucinex and wonders if this could be the cause of her dizziness. She describes dizziness as lightheadedness, not vertigo. She has had to stop and rest to relieve the dizziness. Denies feeling like she was going to pass out. Does have MS but this is not usual MS sx for her.   Also c/o LLE swelling. Has problems with her L knee (hx torn meniscus, prior ortho told her she would need a knee replacemetn at some point). Has had swelling before but feels this is worse, although she has been doing more walking lately due to traveling. Swelling is from knee to ankle, is relieved by elevating leg. No pain in calf with foot dorsiflexion, and no redness or warmth to LLE.   HPI: HPI  Problems:  Patient Active Problem List   Diagnosis Date Noted   Cervical radiculopathy 10/28/2021   High risk medication use 10/28/2021   Cervical disc disorder with radiculopathy 10/19/2017  Periodic limb movement disorder (PLMD) 07/15/2016   Pleural effusion, bilateral 06/30/2014   Shortness of breath 06/29/2014   Hypertension 06/29/2014   Pericarditis 06/29/2014   Pericardial effusion 06/16/2014   Pericardial effusion, acute 06/14/2014   Cardiac/pericardial tamponade 06/11/2014   Cardiac tamponade 06/11/2014   Multiple sclerosis (HCC) 11/21/2013    Allergies:  Allergies  Allergen Reactions   Penicillins Rash   Medications:   Current Outpatient Medications:    ALPRAZolam (XANAX) 0.5 MG tablet, Take 1 tablet 1 hour prior to MRI and another if needed when arrives at site. (Pt must have driver), Disp: 2 tablet, Rfl: 0   Armodafinil 150 MG tablet, 1/2 tablet to 1 tablet po qAM as needed, Disp: 90 tablet, Rfl: 1   atorvastatin (LIPITOR) 10 MG tablet, 10 mg daily., Disp: , Rfl:    buPROPion (WELLBUTRIN XL) 150 MG 24 hr tablet, Take 150 mg by mouth every morning., Disp: , Rfl:    escitalopram (LEXAPRO) 10 MG tablet, Take 5 mg by mouth daily., Disp: , Rfl:    famotidine (PEPCID) 20 MG tablet, Take 1 tablet (20 mg total) by mouth 2 (two) times daily., Disp: 30 tablet, Rfl: 1   gabapentin (NEURONTIN) 100 MG capsule, TAKE 1 CAPSULE BY MOUTH ONCE A DAY AT BEDTIME FOR 3 DAYS THEN INCREASE TO THREE TIMES A DAY, Disp: , Rfl:    hydrochlorothiazide (HYDRODIURIL) 25 MG tablet, Take 25 mg by mouth daily., Disp: , Rfl:    ibuprofen (ADVIL,MOTRIN) 200 MG tablet, Take 400 mg by mouth every 6 (six) hours as needed., Disp: , Rfl:    losartan (COZAAR) 50 MG tablet, Take 1 tablet by mouth daily., Disp: , Rfl:    meloxicam (MOBIC) 15 MG tablet, Take 1 tablet (15 mg total) by mouth daily., Disp: 30 tablet, Rfl: 3   meloxicam (MOBIC) 15 MG tablet, Take 1 tablet (15 mg total) by mouth daily., Disp: 30 tablet, Rfl: 3   methylPREDNISolone (MEDROL DOSEPAK) 4 MG TBPK tablet, 6 day dose pack - take as directed, Disp: 21 tablet, Rfl: 0   Ocrelizumab (OCREVUS IV), Inject into the vein every 6 (six) months. 1st dose 11-2020, Disp: , Rfl:    pantoprazole (PROTONIX) 20 MG tablet, 20 mg daily., Disp: , Rfl:    pramipexole (MIRAPEX) 0.25 MG tablet, TAKE 1 TABLET BY MOUTH 2 TIMES DAILY AS NEEDED., Disp: 180 tablet, Rfl: 2   Pramipexole Dihydrochloride 0.375 MG TB24, TAKE 1 TABLET (0.375 MG TOTAL) BY MOUTH AT BEDTIME., Disp: 90 tablet, Rfl: 3   prednisoLONE acetate (PRED FORTE) 1 % ophthalmic suspension, As needed, Disp: , Rfl:    VENTOLIN HFA 108 (90 Base)  MCG/ACT inhaler, , Disp: , Rfl:   Observations/Objective: Patient is well-developed, well-nourished in no acute distress.  Resting comfortably  at home.  Head is normocephalic, atraumatic.  No labored breathing.  Speech is clear and coherent with logical content.  Patient is alert and oriented at baseline.    Assessment and Plan: 1. Pain and swelling of left lower leg  2. Dizziness  I think a DVT is unlikely given swelling is reduced with elevating leg and neg homan's sign, no redness or warmth of LLE and has had similar sx in past from knee - I rec she f/u with ortho.   Dizziness could be simply from nasal/sinus congestion or it could be due to something else. Pt will make appt to see neuro - in the meantime, will resume mucinex and nasal saline spray or irrigation to  see if that resolves sx.   Follow Up Instructions: I discussed the assessment and treatment plan with the patient. The patient was provided an opportunity to ask questions and all were answered. The patient agreed with the plan and demonstrated an understanding of the instructions.  A copy of instructions were sent to the patient via MyChart unless otherwise noted below.   The patient was advised to call back or seek an in-person evaluation if the symptoms worsen or if the condition fails to improve as anticipated.    [name], NP

## 2023-05-25 NOTE — Patient Instructions (Signed)
[name], thank you for joining Julie Parsons, NP for today's virtual visit.  While this provider is not your primary care provider (PCP), if your PCP is located in our provider database this encounter information will be shared with them immediately following your visit.   [name] MyChart account gives you access to today's visit and all your visits, tests, and labs performed at [name] " click here if you don't have a [name] MyChart account or go to [email or web address]  Consent: (Patient) [name] provided verbal consent for this virtual visit at the beginning of the encounter.  Current Medications:  Current Outpatient Medications:    ALPRAZolam (XANAX) 0.5 MG tablet, Take 1 tablet 1 hour prior to MRI and another if needed when arrives at site. (Pt must have driver), Disp: 2 tablet, Rfl: 0   Armodafinil 150 MG tablet, 1/2 tablet to 1 tablet po qAM as needed, Disp: 90 tablet, Rfl: 1   atorvastatin (LIPITOR) 10 MG tablet, 10 mg daily., Disp: , Rfl:    buPROPion (WELLBUTRIN XL) 150 MG 24 hr tablet, Take 150 mg by mouth every morning., Disp: , Rfl:    escitalopram (LEXAPRO) 10 MG tablet, Take 5 mg by mouth daily., Disp: , Rfl:    famotidine (PEPCID) 20 MG tablet, Take 1 tablet (20 mg total) by mouth 2 (two) times daily., Disp: 30 tablet, Rfl: 1   gabapentin (NEURONTIN) 100 MG capsule, TAKE 1 CAPSULE BY MOUTH ONCE A DAY AT BEDTIME FOR 3 DAYS THEN INCREASE TO THREE TIMES A DAY, Disp: , Rfl:    hydrochlorothiazide (HYDRODIURIL) 25 MG tablet, Take 25 mg by mouth daily., Disp: , Rfl:    ibuprofen (ADVIL,MOTRIN) 200 MG tablet, Take 400 mg by mouth every 6 (six) hours as needed., Disp: , Rfl:    losartan (COZAAR) 50 MG tablet, Take 1 tablet by mouth daily., Disp: , Rfl:    meloxicam (MOBIC) 15 MG tablet, Take 1 tablet (15 mg total) by mouth daily., Disp: 30 tablet, Rfl: 3   meloxicam (MOBIC) 15 MG tablet, Take 1 tablet (15 mg total) by mouth daily., Disp: 30  tablet, Rfl: 3   methylPREDNISolone (MEDROL DOSEPAK) 4 MG TBPK tablet, 6 day dose pack - take as directed, Disp: 21 tablet, Rfl: 0   Ocrelizumab (OCREVUS IV), Inject into the vein every 6 (six) months. 1st dose 11-2020, Disp: , Rfl:    pantoprazole (PROTONIX) 20 MG tablet, 20 mg daily., Disp: , Rfl:    pramipexole (MIRAPEX) 0.25 MG tablet, TAKE 1 TABLET BY MOUTH 2 TIMES DAILY AS NEEDED., Disp: 180 tablet, Rfl: 2   Pramipexole Dihydrochloride 0.375 MG TB24, TAKE 1 TABLET (0.375 MG TOTAL) BY MOUTH AT BEDTIME., Disp: 90 tablet, Rfl: 3   prednisoLONE acetate (PRED FORTE) 1 % ophthalmic suspension, As needed, Disp: , Rfl:    VENTOLIN HFA 108 (90 Base) MCG/ACT inhaler, , Disp: , Rfl:    Medications ordered in this encounter:  No orders of the defined types were placed in this encounter.    *If you need refills on other medications prior to your next appointment, please contact your pharmacy*  Follow-Up: Call back or seek an in-person evaluation if the symptoms worsen or if the condition fails to improve as anticipated.  [facility] [phone]  Other Instructions I think it's reasonable to start with resuming your Mucinex and using saline nasal spray or irrigation. I also suggest making an appointment to see your neurologist - you can  always cancel it if you feel better with Mucinex and saline spray or irrigation.   For your left leg swelling, I recommend you see  your orthopedist again about your left knee.    If you have been instructed to have an in-person evaluation today at a local Urgent Care facility, please use the link below. It will take you to a list of all of our available [facility], including address, phone number and hours of operation. Please do not delay care.  [facility]  If you or a family member do not have a primary care provider, use the link below to schedule a visit and establish care. When you choose a [facility] primary care  physician or advanced practice provider, you gain a long-term partner in health. Find a Primary Care Provider  Learn more about [facility]'s in-office and virtual care options: [facility] - Get Care Now

## 2023-05-26 ENCOUNTER — Other Ambulatory Visit: Payer: BC Managed Care – PPO

## 2023-06-08 ENCOUNTER — Ambulatory Visit: Payer: BC Managed Care – PPO | Admitting: Adult Health

## 2023-06-08 ENCOUNTER — Encounter: Payer: Self-pay | Admitting: Adult Health

## 2023-06-08 VITALS — BP 136/67 | HR 65 | Ht 69.0 in | Wt 283.0 lb

## 2023-06-08 DIAGNOSIS — G2581 Restless legs syndrome: Secondary | ICD-10-CM

## 2023-06-08 DIAGNOSIS — Z79899 Other long term (current) drug therapy: Secondary | ICD-10-CM

## 2023-06-08 DIAGNOSIS — R5383 Other fatigue: Secondary | ICD-10-CM

## 2023-06-08 DIAGNOSIS — G35 Multiple sclerosis: Secondary | ICD-10-CM | POA: Diagnosis not present

## 2023-06-08 DIAGNOSIS — R252 Cramp and spasm: Secondary | ICD-10-CM

## 2023-06-08 DIAGNOSIS — R42 Dizziness and giddiness: Secondary | ICD-10-CM

## 2023-06-08 MED ORDER — BACLOFEN 10 MG PO TABS: 5.0000 mg | ORAL_TABLET | Freq: Every evening | ORAL | 0 refills | Status: DC | PRN

## 2023-06-08 MED ORDER — ARMODAFINIL 150 MG PO TABS: ORAL_TABLET | ORAL | 1 refills | Status: AC

## 2023-06-08 NOTE — Addendum Note (Signed)
Addended by: Enedina Finner on: 06/08/2023 03:54 PM   Modules accepted: Level of Service

## 2023-06-08 NOTE — Progress Notes (Signed)
PATIENT: [name] DOB: [birth year]  REASON FOR VISIT: follow up HISTORY FROM: patient  Chief Complaint  Patient presents with   Follow-up    Pt in  4 Pt here for MS f/u Pt states gait is off Py states some fatigue and dizziness Pt wants to discuss Medications      HISTORY OF PRESENT ILLNESS: Today 06/08/23:  [name] is a 62 y.o. female with a history of multiple sclerosis. Returns today for follow-up.  She states that she is more fatigued.  She states that she has not been on armodafinil.  States that she was unable to get a refill.  She does state that her PCP checked vitamin B12 D and iron.  All were low.  She has been taking supplements.  She states that now her levels are better.  She states in the last 2 to 3 weeks she has been traveling.  She states that she walked into a conference room and got really dizzy.  She states that the room was not spinning but she felt as if she may tip over and fall.  Does not have a history of vertigo.  Does not get motion sick.  She reports it tends to happen when she is in large open rooms.  Is also happened after she had been on an airplane.  She reports that she has an appointment with pulmonology in the next 1 to 2 weeks and plans to discuss with them as well.  She does state that the dizziness has slowly gotten better and does note that she has not been traveling.  She does state that she is having more trouble walking.  She states at night it feels as if her calf and Achilles cramp up.  She takes pramipexole and finds it beneficial but states that the symptoms do not feel like restless legs but rather a muscle cramp.  She is only taking gabapentin as needed.  She returns today for an evaluation.   05/05/22: [name] is a 62 year old female with a history of multiple sclerosis.  She returns today for follow-up.  She remains on Ocrevus.  Last visit was with Dr. Epimenio Foot. Overall she feels that is having a lot of joint and muscle pain. Since  July has gotten worse. Not taking anything for pain. Was taking meloxicam for her shoulder.  She does feel that since she stopped this medication she has been having more joint pain.  Has not discussed with her PCP.  Feels that urinary urgency has improved. She never took oxybutynin. No issues with bowels.  Gait/balance: reports having some knee issue on the left that has affected her gait. Feels that she may need knee replacement Vision: Went to neurologist a year ago and was stable and was discharged.  She is states that she is having blurry vision.  Right greater than left.  She is not sure if it is her visual acuity or possible recurrence of uveitis.  She is not having any eye pain or redness.  Plans to follow-up with optometrist to discuss visual acuity.   10/28/21 (copied from Dr. Epimenio Foot): She is a 62 year old woman who was diagnosed with MS in 2000.  She is on Ocrevus, she has a 1-2 week period of flu-like reaction a couple weeks after the infusion.   MS has been stable and she has had no exacerbation.   Her last exacerbation was in 2014 before the switch to Tysabri.   She is walking without an aide.  She tires out easily.   She feels she could walk a mile without a break.   Her left side is weaker than her right and she has some left leg spasticity.   She has urinary urgency and occasional urge incontinence.   She has not been placed on any medication for this.  She has had chronic right uveitis.       She sleeps poorly and notes restless legs and just general restlessness.   She is taking Mirapex XR once a day and additional dose at bedtime.  She still wakes up with the restless sensation but since the nighttime dose was increased, she has done better.    She still notes left leg spasticity.        She had cervical ACDF 12/2019 C5-C7.     She continues to have left neck pain and pain down the left arm.   Pain will go down to the elbow but not to the hand.      MS History: She was diagnosed in  2000 after presenting with unilateral numbness.   She ws started on Avonex. .   In retrospect, she also had left optic neuritis in 1996.     She switched from Avonex to Rebif due to breakthrough activity.   She was switched to Tysabri due to breakthrough activity.   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          MRI Brain 05/09/2020 shows   Multiple T2/FLAIR hyperintense foci in the cerebellum, brainstem, thalamus and hemispheres in a pattern and configuration consistent with chronic demyelinating plaque associated with multiple sclerosis. None of the foci appears to be acute.  No changes compared to 06/28/2019   MRI cervical spine 05/09/2020 shows    There are T2 hyperintense foci within the posterior spinal cord adjacent to C2 , anteriorly adjacent to C3, posteriorly adjacent to C3-C4 and anteriorly to the left adjacent to C4-C5.and multilevel DJD with mild spinal stenosis at C4C5 and C6C6 and significant foraminal narrowing at C3C4 and C5C6.     Potential for right C4, right C5, bilateral C6 and left C7 nerve root compression.   04/17/21: [name] is a 62 year old female with a history of relapsed remitting multiple sclerosis.  She returns today for follow-up.  At the last visit she was switched to Ocrevus.  Reports that she has tolerated it well.  Since her last visit she has had neck surgery.  She states since then she has noticed numbness in the right hand.  She reports that her neurosurgeon thinks it is carpal tunnel but nerve conduction studies have not been completed.  She also states that she has been having more shoulder pain and knee pain.  Reports that she has been diagnosed with bursitis.  Not sure of if Ocrevus has anything to do with this?  Patient denies any changes with her balance.  No changes with the bowels or bladder.  Patient continues to take long-acting Mirapex and short acting Mirapex 0.25 mg at bedtime.  She reports for the most part this works very well for her.   She states approximately once every 2 weeks she will have a restless night.  She returns today for an evaluation.  10/22/20 [name] is a 62 year old female with a history of relapsed remitting multiple sclerosis.  She returns today for follow-up.  She remains on Tysabri.  She had blood work prior to this visit.  We are still waiting on  the JCV index.  The patient states overall she is remained stable.  She continues to have numbness in the hands left side greater than right.  She did have nerve conduction studies completed at [facility] but has not received the results.  She also reports that she has a torn MCL in the left knee.  They have not surgically corrected this.  Patient feels that bowels and bladder have remained stable.  No significant changes in her vision.  She states that she may be having a flareup of uveitis in the left eye.  She has not seen her ophthalmologist recently.  She does have prednisone drops that she can use when this happens.  04/24/20 Ms. Bauder is a 62 year old female with a history of relapsed remitting multiple sclerosis.  She returns today for follow-up.  She remains on Tysabri.  She denies any new symptoms.  No new changes with the bowels or bladder.  No new weak numbness or weakness.  No changes with her gait or balance.  She does state that she changed to another job in March.  She has been under a lot of stress.  She states that this has resulted in some panic attacks.  She also noted some cognitive changes.  She reports the inability to concentrate and focus at times.  Also reports that she can be forgetful with things like where she placed her keys it.  She denies any significant changes at home.  Able to complete all ADLs independently.  Operates a motor vehicle without difficulty.  Manages her own finances without difficulty.  She returns today for an evaluation.  HISTORY 10/17/19:   Ms. Slupski is a 62 year old female with a history of relapsed remitting  multiple sclerosis.  She returns today for follow-up.  She continues on Tysabri.  Denies any new symptoms.  Denies any changes with her vision.  No changes with the bowels or bladder with the exception that she has diarrhea more often now.  No change in her diet.  She reports that she had a fall in December.  Reports that she slipped on her back deck.  Reports that she injured her left knee but this is slowly improving.  Denies any new weakness.  Continues to have numbness down the left arm.  Has not followed up with Dr. Yetta Barre.  REVIEW OF SYSTEMS: Out of a complete 14 system review of symptoms, the patient complains only of the following symptoms, and all other reviewed systems are negative.  See HPI  ALLERGIES: Allergies  Allergen Reactions   Penicillins Rash    HOME MEDICATIONS: Outpatient Medications Prior to Visit  Medication Sig Dispense Refill   ALPRAZolam (XANAX) 0.5 MG tablet Take 1 tablet 1 hour prior to MRI and another if needed when arrives at site. (Pt must have driver) 2 tablet 0   Armodafinil 150 MG tablet 1/2 tablet to 1 tablet po qAM as needed 90 tablet 1   atorvastatin (LIPITOR) 10 MG tablet 10 mg daily.     buPROPion (WELLBUTRIN XL) 150 MG 24 hr tablet Take 150 mg by mouth every morning.     escitalopram (LEXAPRO) 10 MG tablet Take 5 mg by mouth daily.     famotidine (PEPCID) 20 MG tablet Take 1 tablet (20 mg total) by mouth 2 (two) times daily. 30 tablet 1   gabapentin (NEURONTIN) 100 MG capsule TAKE 1 CAPSULE BY MOUTH ONCE A DAY AT BEDTIME FOR 3 DAYS THEN INCREASE TO THREE TIMES A DAY  hydrochlorothiazide (HYDRODIURIL) 25 MG tablet Take 25 mg by mouth daily.     ibuprofen (ADVIL,MOTRIN) 200 MG tablet Take 400 mg by mouth every 6 (six) hours as needed.     losartan (COZAAR) 50 MG tablet Take 1 tablet by mouth daily.     meloxicam (MOBIC) 15 MG tablet Take 1 tablet (15 mg total) by mouth daily. 30 tablet 3   Ocrelizumab (OCREVUS IV) Inject into the vein every 6 (six)  months. 1st dose 11-2020     pantoprazole (PROTONIX) 20 MG tablet 20 mg daily.     pramipexole (MIRAPEX) 0.25 MG tablet TAKE 1 TABLET BY MOUTH 2 TIMES DAILY AS NEEDED. 180 tablet 2   Pramipexole Dihydrochloride 0.375 MG TB24 TAKE 1 TABLET (0.375 MG TOTAL) BY MOUTH AT BEDTIME. 90 tablet 3   prednisoLONE acetate (PRED FORTE) 1 % ophthalmic suspension As needed     VENTOLIN HFA 108 (90 Base) MCG/ACT inhaler      meloxicam (MOBIC) 15 MG tablet Take 1 tablet (15 mg total) by mouth daily. 30 tablet 3   methylPREDNISolone (MEDROL DOSEPAK) 4 MG TBPK tablet 6 day dose pack - take as directed 21 tablet 0   No facility-administered medications prior to visit.    PAST MEDICAL HISTORY: Past Medical History:  Diagnosis Date   Asthma    Back pain    Cardiac/pericardial tamponade 06/11/2014   s/p pericardial window 06/16/2014 per Dr. Maren Beach   Depression    Hypertension    MS (multiple sclerosis) (HCC)    Muscle pain    Obesity    Periodic limb movement disorder (PLMD) 07/15/2016   Pleural effusion, bilateral 06/30/2014   Uveitis    Bilateral    PAST SURGICAL HISTORY: Past Surgical History:  Procedure Laterality Date   CERVICAL FUSION     01-02-21, [facility] C5-7   microdisectomy  2019   lumbar spine   PERICARDIAL TAP N/A 06/11/2014   Procedure: PERICARDIAL TAP;  Surgeon: Micheline Chapman, MD;  Location: [facility] CATH LAB;  Service: Cardiovascular;  Laterality: N/A;   SUBXYPHOID PERICARDIAL WINDOW N/A 06/16/2014   Procedure: SUBXYPHOID PERICARDIAL WINDOW;  Surgeon: Kerin Perna, MD;  Location: [facility] OR;  Service: Thoracic;  Laterality: N/A;   TEE WITHOUT CARDIOVERSION N/A 06/16/2014   Procedure: TRANSESOPHAGEAL ECHOCARDIOGRAM (TEE);  Surgeon: Kerin Perna, MD;  Location: [facility] OR;  Service: Thoracic;  Laterality: N/A;   uterine ablasion  2012    FAMILY HISTORY: Family History  Problem Relation Age of Onset   Breast cancer Mother    Hypertension Mother    Diabetes Mother    Heart attack  Father    Epilepsy Father    Endocrine tumor Brother    Multiple sclerosis Neg Hx     SOCIAL HISTORY: Social History   Socioeconomic History   Marital status: Married    Spouse name: Not on file   Number of children: 0   Years of education: MA   Highest education level: Not on file  Occupational History   Occupation: [employer]  Tobacco Use   Smoking status: Never   Smokeless tobacco: Never  Vaping Use   Vaping status: Some Days  Substance and Sexual Activity   Alcohol use: Yes    Alcohol/week: 3.0 standard drinks of alcohol    Types: 3 Standard drinks or equivalent per week    Comment: occasional   Drug use: No    Comment: marijuana   Sexual activity: Not Currently  Other  Topics Concern   Not on file  Social History Narrative   Patient lives at home with her wife Fannie Knee)    Patient works full time.   Financial controller.   Right handed.   Caffeine two cups daily.   Social Determinants of Health   Financial Resource Strain: Not on file  Food Insecurity: Not on file  Transportation Needs: Not on file  Physical Activity: Not on file  Stress: Not on file  Social Connections: Not on file  Intimate Partner Violence: Not on file      PHYSICAL EXAM  Vitals:   06/08/23 1032  BP: 136/67  Pulse: 65  Weight: 283 lb (128.4 kg)  Height: 5\' 9"  (1.753 m)   Body mass index is 41.79 kg/m.  No results found.    Generalized: Well developed, in no acute distress   Neurological examination  Mentation: Alert oriented to time, place, history taking. Follows all commands speech and language fluent Cranial nerve II-XII: Pupils were equal round reactive to light. Extraocular movements were full, visual field were full on confrontational test. Head turning and shoulder shrug  were normal and symmetric. Motor: The motor testing reveals 5 over 5 strength of all 4 extremities. Good symmetric motor tone is noted throughout.  Sensory: Sensory testing is  intact to soft touch on all 4 extremities. No evidence of extinction is noted.  Coordination: Cerebellar testing reveals good finger-nose-finger and heel-to-shin bilaterally.  Gait and station: Gait is normal.  Reflexes: Deep tendon reflexes are symmetric and normal bilaterally.   DIAGNOSTIC DATA (LABS, IMAGING, TESTING) - I reviewed patient records, labs, notes, testing and imaging myself where available.  Lab Results  Component Value Date   WBC 4.6 05/05/2022   HGB 12.9 05/05/2022   HCT 37.3 05/05/2022   MCV 88 05/05/2022   PLT 236 05/05/2022      Component Value Date/Time   NA 135 05/05/2022 1050   K 5.0 05/05/2022 1050   CL 93 (L) 05/05/2022 1050   CO2 22 05/05/2022 1050   GLUCOSE 83 05/05/2022 1050   GLUCOSE 84 07/10/2014 0944   BUN 21 05/05/2022 1050   CREATININE 0.74 05/05/2022 1050   CREATININE 0.76 06/27/2014 0915   CALCIUM 9.4 05/05/2022 1050   PROT 6.9 05/05/2022 1050   ALBUMIN 4.3 05/05/2022 1050   AST 19 05/05/2022 1050   ALT 21 05/05/2022 1050   ALKPHOS 110 05/05/2022 1050   BILITOT 0.2 05/05/2022 1050   GFRNONAA 86 04/24/2020 0915   GFRAA 99 04/24/2020 0915       ASSESSMENT AND PLAN 62 y.o. year old female  has a past medical history of Asthma, Back pain, Cardiac/pericardial tamponade (06/11/2014), Depression, Hypertension, MS (multiple sclerosis) (HCC), Muscle pain, Obesity, Periodic limb movement disorder (PLMD) (07/15/2016), Pleural effusion, bilateral (06/30/2014), and Uveitis. here with:  1.  Multiple sclerosis 2.  Restless leg 3.  Muscle cramps 4.  Dizziness 5.  Fatigue   Continue Ocrevus  Blood work today Continue Armodafinil 150 mg daily  Will go ahead and repeat MRI of the brain and cervical spine with and without contrast.  Patient having more trouble with her gait, new dizziness.  Will go ahead and repeat imaging to rule out progression or other causes Continue Mirapex long-acting 0.375 mg at bedtime Continue Mirapex short acting 0.25 mg  1-2 tablets  at bedtime.  Try baclofen 5 mg at bedtime   Follow-up in 6-7 months or sooner if needed  [name], MSN, NP-C 06/08/2023, 10:39 AM [facility] [address] [place], [place] [address] [phone]

## 2023-06-09 ENCOUNTER — Ambulatory Visit: Payer: BC Managed Care – PPO

## 2023-06-09 LAB — COMPREHENSIVE METABOLIC PANEL
ALT: 20 [IU]/L (ref 0–32)
AST: 17 [IU]/L (ref 0–40)
Albumin: 4 g/dL (ref 3.8–4.9)
Alkaline Phosphatase: 123 [IU]/L — ABNORMAL HIGH (ref 44–121)
BUN/Creatinine Ratio: 21 (ref 12–28)
BUN: 15 mg/dL (ref 8–27)
Bilirubin Total: 0.3 mg/dL (ref 0.0–1.2)
CO2: 24 mmol/L (ref 20–29)
Calcium: 9.3 mg/dL (ref 8.7–10.3)
Chloride: 98 mmol/L (ref 96–106)
Creatinine, Ser: 0.71 mg/dL (ref 0.57–1.00)
Globulin, Total: 2.7 g/dL (ref 1.5–4.5)
Glucose: 83 mg/dL (ref 70–99)
Potassium: 4.7 mmol/L (ref 3.5–5.2)
Sodium: 136 mmol/L (ref 134–144)
Total Protein: 6.7 g/dL (ref 6.0–8.5)
eGFR: 97 mL/min/{1.73_m2} (ref >59)

## 2023-06-09 LAB — CBC WITH DIFFERENTIAL/PLATELET
Basophils Absolute: 0 10*3/uL (ref 0.0–0.2)
Basos: 1 %
EOS (ABSOLUTE): 0.2 10*3/uL (ref 0.0–0.4)
Eos: 5 %
Hematocrit: 38.8 % (ref 34.0–46.6)
Hemoglobin: 12.9 g/dL (ref 11.1–15.9)
Immature Grans (Abs): 0 10*3/uL (ref 0.0–0.1)
Immature Granulocytes: 0 %
Lymphocytes Absolute: 1 10*3/uL (ref 0.7–3.1)
Lymphs: 24 %
MCH: 31.2 pg (ref 26.6–33.0)
MCHC: 33.2 g/dL (ref 31.5–35.7)
MCV: 94 fL (ref 79–97)
Monocytes Absolute: 0.4 10*3/uL (ref 0.1–0.9)
Monocytes: 11 %
Neutrophils Absolute: 2.5 10*3/uL (ref 1.4–7.0)
Neutrophils: 59 %
Platelets: 227 10*3/uL (ref 150–450)
RBC: 4.13 x10E6/uL (ref 3.77–5.28)
RDW: 12.7 % (ref 11.7–15.4)
WBC: 4.2 10*3/uL (ref 3.4–10.8)

## 2023-06-09 LAB — IGG, IGA, IGM
IgA/Immunoglobulin A, Serum: 158 mg/dL (ref 87–352)
IgG (Immunoglobin G), Serum: 1155 mg/dL (ref 586–1602)
IgM (Immunoglobulin M), Srm: 67 mg/dL (ref 26–217)

## 2023-06-09 NOTE — Progress Notes (Signed)
  Patient was seen, measured / scanned for custom molded foot orthotics.  Patient will benefit from CFO's as they will help provide total contact to MLA's helping to better distribute body weight across BIL feet greater reducing plantar pressure and pain and to also encourage FF and RF alignment.  Patient was scanned items to be ordered and fit when in  [employer], CFo, CFm

## 2023-06-10 NOTE — Progress Notes (Unsigned)
[name], female    DOB: Mar 26, 1961   MRN: [id number]   Brief patient profile:  62 yowf   never smoker / sometimes vape THC referred to pulmonary clinic 06/11/2023 by Dr [name]  for cough.  Onset 6-8 months with no background of chronic cough or other resp cc        Dx MS  2000 on biologic   Dx Asthma about the same time in [place] at wt 200   S/p  cx  R sided neck surgery=  s/p ACDF 3-level R sided approach (C5-C7)   May 2023 sensation of choking when eating unless holding neck straight seems to be improving with ST eval neg 03/18/2023     History of Present Illness  06/11/2023  Pulmonary/ 1st office eval/[name]  muicnex may be helping /  Chief Complaint  Patient presents with   Consult    Cough and chest tightness x 6 months  Dyspnea:  more doe steps / no trouble flat and nl pace eg airport walking recently did fine  Cough: worse p supper and much worse at hs but non productive Sleep: sitting up helps/ lying flat makes it worse - bed is flat with 4 pillows  SABA use: may helps some with  noct wheeze   No obvious day to day or daytime pattern/variability or assoc excess/ purulent sputum or mucus plugs or hemoptysis or cp or  overt sinus or hb symptoms.    Also denies any obvious fluctuation of symptoms with weather or environmental changes or other aggravating or alleviating factors except as outlined above   No unusual exposure hx or h/o childhood pna/ asthma or knowledge of premature birth.  Current Allergies, Complete Past Medical History, Past Surgical History, Family History, and Social History were reviewed in [employer] record.  ROS  The following are not active complaints unless bolded Hoarseness, sore throat, dysphagia, dental problems, itching, sneezing,  nasal congestion or discharge of excess mucus or purulent secretions, ear ache,   fever, chills, sweats, unintended wt loss or wt gain, classically pleuritic or exertional cp,  orthopnea pnd  or arm/hand swelling  or leg swelling, presyncope, palpitations, abdominal pain, anorexia, nausea, vomiting, diarrhea  or change in bowel habits or change in bladder habits, change in stools or change in urine, dysuria, hematuria,  rash, arthralgias, visual complaints, headache, numbness, weakness or ataxia or problems with walking or coordination,  change in mood or  memory.             Outpatient Medications Prior to Visit-  Not limited by breathing from desired activities    Medication Sig Dispense Refill   ALPRAZolam (XANAX) 0.5 MG tablet Take 1 tablet 1 hour prior to MRI and another if needed when arrives at site. (Pt must have driver) 2 tablet 0   Armodafinil 150 MG tablet 1/2 tablet to 1 tablet po qAM as needed 90 tablet 1   atorvastatin (LIPITOR) 10 MG tablet 10 mg daily.     baclofen (LIORESAL) 10 MG tablet Take 0.5 tablets (5 mg total) by mouth at bedtime as needed for muscle spasms. 30 each 0   buPROPion (WELLBUTRIN XL) 150 MG 24 hr tablet Take 150 mg by mouth every morning.     escitalopram (LEXAPRO) 10 MG tablet Take 5 mg by mouth daily.     famotidine (PEPCID) 20 MG tablet Take 1 tablet (20 mg total) by mouth 2 (two) times daily. 30 tablet 1   gabapentin (NEURONTIN) 100  MG capsule TAKE 1 CAPSULE BY MOUTH ONCE A DAY AT BEDTIME FOR 3 DAYS THEN INCREASE TO THREE TIMES A DAY     hydrochlorothiazide (HYDRODIURIL) 25 MG tablet Take 25 mg by mouth daily.     ibuprofen (ADVIL,MOTRIN) 200 MG tablet Take 400 mg by mouth every 6 (six) hours as needed.     losartan (COZAAR) 50 MG tablet Take 1 tablet by mouth daily.     meloxicam (MOBIC) 15 MG tablet Take 1 tablet (15 mg total) by mouth daily. 30 tablet 3   meloxicam (MOBIC) 15 MG tablet Take 1 tablet (15 mg total) by mouth daily. 30 tablet 3          Ocrelizumab (OCREVUS IV) Inject into the vein every 6 (six) months. 1st dose 11-2020     pantoprazole (PROTONIX) 20 MG tablet 20 mg daily.     pramipexole (MIRAPEX) 0.25 MG tablet TAKE 1 TABLET  BY MOUTH 2 TIMES DAILY AS NEEDED. 180 tablet 2   Pramipexole Dihydrochloride 0.375 MG TB24 TAKE 1 TABLET (0.375 MG TOTAL) BY MOUTH AT BEDTIME. 90 tablet 3   prednisoLONE acetate (PRED FORTE) 1 % ophthalmic suspension As needed     VENTOLIN HFA 108 (90 Base) MCG/ACT inhaler      No facility-administered medications prior to visit.    Past Medical History:  Diagnosis Date   Asthma    Back pain    Cardiac/pericardial tamponade 06/11/2014   s/p pericardial window 06/16/2014 per Dr. Maren Beach   Depression    Hypertension    MS (multiple sclerosis) (HCC)    Muscle pain    Obesity    Periodic limb movement disorder (PLMD) 07/15/2016   Pleural effusion, bilateral 06/30/2014   Uveitis    Bilateral      Objective:     BP 136/78 (BP Location: Left Arm, Patient Position: Sitting, Cuff Size: Large)   Pulse 70   Temp 97.8 F (36.6 C) (Oral)   Ht 5\' 9"  (1.753 m)   Wt 280 lb 9.6 oz (127.3 kg)   SpO2 95%   BMI 41.44 kg/m   SpO2: 95 %  Amb MO (by BMI) wf nad    HEENT : Oropharynx  clear      Nasal turbinates nl / scar base of R neck fo ACDF    NECK :  without  apparent JVD/ palpable Nodes/TM    LUNGS: no acc muscle use,  Nl contour chest which is clear to A and P bilaterally without cough on insp or exp maneuvers   CV:  RRR  no s3 or murmur or increase in P2, and no edema   ABD: obese  soft and nontender with nl inspiratory excursion in the supine position. No bruits or organomegaly appreciated   MS:  Nl gait/ ext warm without deformities Or obvious joint restrictions  calf tenderness, cyanosis or clubbing    SKIN: warm and dry without lesions    NEURO:  alert, approp, nl sensorium with  no motor or cerebellar deficits apparent.    CXR PA and Lateral:   06/11/2023 :    I personally reviewed images and impression is as follows:  No acute findings       Assessment   Upper airway cough syndrome Onset spring 2024 in pt s/p  R sided approach (C5-C7)   May 2023  with  sensation of choking when swallowing unless hold head up staight but neg ST eval / ent eval summer of 2024  - 06/11/2023  rec max gerd rx/ gabapentin 100 mg qid plus 1st gen H1 blockers per guidelines    The sensation of choking is not typical of cough variant asthma though this remains in ddx  More likely this is Upper airway cough syndrome (previously labeled PNDS),  is so named because it's frequently impossible to sort out how much is  CR/sinusitis with freq throat clearing (which can be related to primary GERD)   vs  causing  secondary (" extra esophageal")  GERD from wide swings in gastric pressure that occur with throat clearing, often  promoting self use of mint and menthol lozenges that reduce the lower esophageal sphincter tone and exacerbate the problem further in a cyclical fashion.   These are the same pts (now being labeled as having "irritable larynx syndrome" by some cough centers) who not infrequently have a history of having failed to tolerate ace inhibitors,  dry powder inhalers or biphosphonates or report having atypical/extraesophageal reflux symptoms(from LPR)  that don't respond to standard doses of PPI  and are easily confused as having aecopd or asthma flares by even experienced allergists/ pulmonologists (myself included).   Rec as above with f/u in 6 weeks with all meds in hand using a trust but verify approach to confirm accurate Medication  Reconciliation The principal here is that until we are certain that the  patients are doing what we've asked, it makes no sense to ask them to do more.          Each maintenance medication was reviewed in detail including emphasizing most importantly the difference between maintenance and prns and under what circumstances the prns are to be triggered using an action plan format where appropriate.  Total time for H and P, chart review, counseling, reviewing hfa  device(s) and generating customized AVS unique to this office visit / same  day charting  > 45 min new pt eval          Sandrea Hughs, MD 06/11/2023

## 2023-06-11 ENCOUNTER — Encounter: Payer: Self-pay | Admitting: Internal Medicine

## 2023-06-11 ENCOUNTER — Ambulatory Visit: Payer: BC Managed Care – PPO

## 2023-06-11 ENCOUNTER — Ambulatory Visit: Payer: BC Managed Care – PPO | Admitting: Internal Medicine

## 2023-06-11 VITALS — BP 136/78 | HR 70 | Temp 97.8°F | Ht 69.0 in | Wt 280.6 lb

## 2023-06-11 DIAGNOSIS — R058 Other specified cough: Secondary | ICD-10-CM

## 2023-06-11 DIAGNOSIS — R059 Cough, unspecified: Secondary | ICD-10-CM | POA: Diagnosis not present

## 2023-06-11 DIAGNOSIS — M5134 Other intervertebral disc degeneration, thoracic region: Secondary | ICD-10-CM | POA: Diagnosis not present

## 2023-06-11 MED ORDER — FAMOTIDINE 20 MG PO TABS: ORAL_TABLET | ORAL | 11 refills | Status: DC

## 2023-06-11 MED ORDER — PANTOPRAZOLE SODIUM 40 MG PO TBEC
40.0000 mg | DELAYED_RELEASE_TABLET | Freq: Every day | ORAL | 2 refills | Status: DC
Start: 2023-06-11 — End: 2023-07-11

## 2023-06-11 NOTE — Patient Instructions (Addendum)
Pantoprazole (protonix) 40 mg   Take  30-60 min before first meal of the day and Pepcid (famotidine)  20 mg after supper until return to office    Gabapentin 100 mg  4 x daily until return   GERD (REFLUX)  is an extremely common cause of respiratory symptoms just like yours , many times with no obvious heartburn at all.    It can be treated with medication, but also with lifestyle changes including elevation of the head of your bed (ideally with 6 -8inch blocks under the headboard of your bed),  Smoking cessation, avoidance of late meals, excessive alcohol, and avoid fatty foods, chocolate, peppermint, colas, red wine, and acidic juices such as orange juice.  NO MINT OR MENTHOL PRODUCTS SO NO COUGH DROPS  USE SUGARLESS CANDY INSTEAD (Jolley ranchers or Stover's or Life Savers) or even ice chips will also do - the key is to swallow to prevent all throat clearing. NO OIL BASED VITAMINS - use powdered substitutes.  Avoid fish oil when coughing.   Stop mucinex and For drainage / throat tickle try take CHLORPHENIRAMINE  4 mg  ("Allergy Relief" 4mg   at [facility] should be easiest to find in the blue and green box usually on bottom shelf)  take one every 4 hours as needed - extremely effective and inexpensive over the counter- may cause drowsiness so start with just a dose or two an hour before bedtime and see how you tolerate it before trying in daytime.   Please schedule a follow up office visit in 6 weeks, call sooner if needed with all medications /inhalers/ solutions in hand so we can verify exactly what you are taking. This includes all medications from all doctors and over the counters - PLEASE separate them into two bags:  the ones you take automatically, no matter what, vs the ones you take just when you feel you need them "BAG #2 is UP TO YOU"  - this will really help [place] help you take your medications more effectively.

## 2023-06-11 NOTE — Assessment & Plan Note (Addendum)
Onset spring 2024 in pt s/p  R sided approach (C5-C7)   May 2023  with sensation of choking when swallowing unless hold head up staight but neg ST eval / ent eval summer of 2024  - 06/11/2023 rec max gerd rx/ gabapentin 100 mg qid plus 1st gen H1 blockers per guidelines    The sensation of choking is not typical of cough variant asthma though this remains in ddx  More likely this is Upper airway cough syndrome (previously labeled PNDS),  is so named because it's frequently impossible to sort out how much is  CR/sinusitis with freq throat clearing (which can be related to primary GERD)   vs  causing  secondary (" extra esophageal")  GERD from wide swings in gastric pressure that occur with throat clearing, often  promoting self use of mint and menthol lozenges that reduce the lower esophageal sphincter tone and exacerbate the problem further in a cyclical fashion.   These are the same pts (now being labeled as having "irritable larynx syndrome" by some cough centers) who not infrequently have a history of having failed to tolerate ace inhibitors,  dry powder inhalers or biphosphonates or report having atypical/extraesophageal reflux symptoms(from LPR)  that don't respond to standard doses of PPI  and are easily confused as having aecopd or asthma flares by even experienced allergists/ pulmonologists (myself included).   Rec as above with f/u in 6 weeks with all meds in hand using a trust but verify approach to confirm accurate Medication  Reconciliation The principal here is that until we are certain that the  patients are doing what we've asked, it makes no sense to ask them to do more.          Each maintenance medication was reviewed in detail including emphasizing most importantly the difference between maintenance and prns and under what circumstances the prns are to be triggered using an action plan format where appropriate.  Total time for H and P, chart review, counseling, reviewing hfa   device(s) and generating customized AVS unique to this office visit / same day charting  > 45 min new pt eval

## 2023-06-19 DIAGNOSIS — Z9181 History of falling: Secondary | ICD-10-CM | POA: Diagnosis not present

## 2023-06-19 DIAGNOSIS — H81392 Other peripheral vertigo, left ear: Secondary | ICD-10-CM | POA: Diagnosis not present

## 2023-06-19 DIAGNOSIS — M6281 Muscle weakness (generalized): Secondary | ICD-10-CM | POA: Diagnosis not present

## 2023-06-26 DIAGNOSIS — H20022 Recurrent acute iridocyclitis, left eye: Secondary | ICD-10-CM | POA: Diagnosis not present

## 2023-07-01 DIAGNOSIS — Z9181 History of falling: Secondary | ICD-10-CM | POA: Diagnosis not present

## 2023-07-01 DIAGNOSIS — M6281 Muscle weakness (generalized): Secondary | ICD-10-CM | POA: Diagnosis not present

## 2023-07-01 DIAGNOSIS — H81392 Other peripheral vertigo, left ear: Secondary | ICD-10-CM | POA: Diagnosis not present

## 2023-07-03 ENCOUNTER — Ambulatory Visit (INDEPENDENT_AMBULATORY_CARE_PROVIDER_SITE_OTHER): Payer: BC Managed Care – PPO | Admitting: Otolaryngology

## 2023-07-03 ENCOUNTER — Encounter (INDEPENDENT_AMBULATORY_CARE_PROVIDER_SITE_OTHER): Payer: Self-pay | Admitting: Otolaryngology

## 2023-07-03 VITALS — BP 154/84 | HR 73

## 2023-07-03 DIAGNOSIS — K219 Gastro-esophageal reflux disease without esophagitis: Secondary | ICD-10-CM

## 2023-07-03 DIAGNOSIS — R0982 Postnasal drip: Secondary | ICD-10-CM | POA: Diagnosis not present

## 2023-07-03 DIAGNOSIS — R053 Chronic cough: Secondary | ICD-10-CM | POA: Diagnosis not present

## 2023-07-03 DIAGNOSIS — R1310 Dysphagia, unspecified: Secondary | ICD-10-CM

## 2023-07-03 DIAGNOSIS — J385 Laryngeal spasm: Secondary | ICD-10-CM

## 2023-07-03 DIAGNOSIS — R0981 Nasal congestion: Secondary | ICD-10-CM | POA: Diagnosis not present

## 2023-07-03 DIAGNOSIS — J3089 Other allergic rhinitis: Secondary | ICD-10-CM

## 2023-07-03 MED ORDER — FAMOTIDINE 20 MG PO TABS: 20.0000 mg | ORAL_TABLET | Freq: Two times a day (BID) | ORAL | 3 refills | Status: AC

## 2023-07-03 MED ORDER — FLUTICASONE PROPIONATE 50 MCG/ACT NA SUSP: 2.0000 | Freq: Two times a day (BID) | NASAL | 6 refills | Status: AC

## 2023-07-03 NOTE — Progress Notes (Signed)
ENT Progress Note:  Update 07/03/23:  She returns after swallow study and evaluation by Pulm. She reports mild improvement in cough, and has been on reflux medications and reflux gourmet. Was not able to implement diet and lifestyle changes to minimize reflux. She has been taking antihistamine and using nasal spray. MBS/esophagram with normal swallow.  Initial Evaluation 02/23/23 Reason for Consult: dysphagia   HPI: [name] is a 62 y.o. female with hx of asthma, MS, on biologic, hx of HTN, depression and PLMD/OSA, s/p ACDF 3-level R sided approach (C5-C7) several yrs ago, here for 6-8 months of dysphagia and dry cough with throat tightness with the episodes occur.    She starts to cough and her throat constricts, and she has a sensation of throat closing off. She coughs daily, and the cough is dry. She has occasional reflux and self-treats with TUMS (takes once a month). She has large pill dysphagia, denies coughing or choking on foods or fluids. She does feel short of breath when she lies down at night. She has asthma, and uses inhaler as needed. She was diagnosed with asthma 20 yrs ago, and uses albuterol once a week. 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 She is on Losartan/hydrochlorothiazide. No hx of MI or stroke.  No relationship to eating with her sx. She has neck tension left side and it is chronic for her. She did not discuss this with her spine surgeon. Her cough is always dry. No other head or neck procedures in the past. No hx of smoking in the past. No hx of arrhythmia. Records review indicates that she has demyelinating process of the brain and cervical spine C3, C4-5, T1.  She has history of MS with positive findings on MRI brain and C-spine, followed by neurology.  Records Reviewed:  MRI brain 06/2019 Impression   MRI brain (with and without)  demonstrating: -Multiple supratentorial and infratentorial chronic demyelinating plaques. -No acute plaques.  No change from 04/21/2018.   MRI c-spine    Abnormal MRI cervical spine (with and without) demonstrating: - Chronic demyelinating plaques at cervical medullary junction, C3, C3-4, and T1 levels.   - No acute plaques. No change from MRI on 04/21/18. - Additional multi-level degenerative changes as above.      Past Medical History:  Diagnosis Date   Asthma    Back pain    Cardiac/pericardial tamponade 06/11/2014   s/p pericardial window 06/16/2014 per Dr. Maren Beach   Depression    Hypertension    MS (multiple sclerosis) (HCC)    Muscle pain    Obesity    Periodic limb movement disorder (PLMD) 07/15/2016   Pleural effusion, bilateral 06/30/2014   Uveitis    Bilateral    Past Surgical History:  Procedure Laterality Date   CERVICAL FUSION     01-02-21, [place] C5-7   microdisectomy  2019   lumbar spine   PERICARDIAL TAP N/A 06/11/2014   Procedure: PERICARDIAL TAP;  Surgeon: Micheline Chapman, MD;  Location: [facility] CATH LAB;  Service: Cardiovascular;  Laterality: N/A;   SUBXYPHOID PERICARDIAL WINDOW N/A 06/16/2014   Procedure: SUBXYPHOID PERICARDIAL WINDOW;  Surgeon: Kerin Perna, MD;  Location: [facility] OR;  Service: Thoracic;  Laterality: N/A;   TEE WITHOUT CARDIOVERSION N/A 06/16/2014   Procedure: TRANSESOPHAGEAL ECHOCARDIOGRAM (TEE);  Surgeon: Kerin Perna, MD;  Location: [facility] OR;  Service: Thoracic;  Laterality:  N/A;   uterine ablasion  2012    Family History  Problem Relation Age of Onset   Breast cancer Mother    Hypertension Mother    Diabetes Mother    Heart attack Father    Epilepsy Father    Endocrine tumor Brother    Multiple sclerosis Neg Hx     Social History:  reports that she has never smoked. She has never used smokeless tobacco. She reports current alcohol use of about 3.0 standard drinks of alcohol per week. She reports that she does not use  drugs.  Allergies:  Allergies  Allergen Reactions   Penicillins Rash    Medications: I have reviewed the patient's current medications.  The PMH, PSH, Medications, Allergies, and SH were reviewed and updated.  ROS: Constitutional: Negative for fever, weight loss and weight gain. Cardiovascular: Negative for chest pain and dyspnea on exertion. Respiratory: Is not experiencing shortness of breath at rest. Gastrointestinal: Negative for nausea and vomiting. Neurological: Negative for headaches. Psychiatric: The patient is not nervous/anxiou  Blood pressure (!) 154/84, pulse 73, SpO2 97%.  PHYSICAL EXAM:  Exam: General: Well-developed, well-nourished Respiratory Respiratory effort: Equal inspiration and expiration without stridor Cardiovascular Peripheral Vascular: Warm extremities with equal color/perfusion Eyes: No nystagmus with equal extraocular motion bilaterally Neuro/Psych/Balance: Patient oriented to person, place, and time; Appropriate mood and affect; Gait is intact with no imbalance; Cranial nerves I-XII are intact Head and Face Inspection: Normocephalic and atraumatic without mass or lesion Facial Strength: Facial motility symmetric and full bilaterally ENT Pinna: External ear intact and fully developed External canal: Canal is patent with intact skin Tympanic Membrane: Clear and mobile External Nose: No scar or anatomic deformity Lips, Teeth, and gums: Mucosa and teeth intact and viable Oral cavity/oropharynx: No erythema or exudate, no lesions present Neck Neck and Trachea: Midline trachea without mass or lesion Thyroid: No mass or nodularity Lymphatics: No lymphadenopathy   Studies Reviewed:  CXR 06/11/23 - done and not interpreted but no evidence of tumor/mass on my review   Esophagram 03/18/23 FINDINGS: Swallowing: Anterior plate and screw fixation at C4 through at least C7. No vestibular penetration or aspiration seen.   Pharynx: Unremarkable.    Esophagus: No evidence of fixed stricture, mass or mucosal abnormality. Tiny, sliding-type hiatal hernia. A mucosal B ring is incidentally noted including on 33/8.   Esophageal motility: Within normal limits.   Gastroesophageal reflux: Absent   Ingested 13mm barium tablet: Passed normally.   IMPRESSION: 1. No explanation for dysphagia. 2. Tiny, sliding-type hiatal hernia.  03/18/23 Clinical Impression: Patient presents with what appears to be a primary structural dysphagia as per this Modified Barium Swallow study. With head in neutral position, all solid and liquid consistencies of barium and 13mm barium tablet transited through pharynx, UES and upper esophagus without difficulty. As patient c/o of feeling like she is choking when she has her head tilted down (such as when she is in bed reading), SLP instructed her to perform a chin tuck posture then swallow nectar thick liquids. The result was that her epiglottis came into contact with posterior pharyngeal wall, effectively closing off pharynx and preventing barium transit with majority of barium remaining above epiglottis in vallecular space. Appearance of posterior pharyngeal wall bulging at level of ACDF hardware, however patient denies any dysphagia after surgery and reports her symptoms started about 6 months ago. When she returned head to neutral position, she was able to then swallow the barium that had collected in vallecular space without difficulty.  This was done twice with same results. No penetration or aspiration seen with any consistency of barium. SLP recommended patient try to keep head in neutral position, especially with PO intake. She seems to have excessive phlegm in pharynx per her report which would explain feeling of choking with head tilted downward in absence of any PO's.    Records reviewed today  - Pulm office note 06/11/23 2 yowf   never smoker / sometimes vape THC referred to pulmonary clinic 06/11/2023 by Dr  Irene Pap  for cough.   Onset 6-8 months with no background of chronic cough or other resp cc          Dx MS  2000 on biologic    Dx Asthma about the same time in [place] at wt 200    S/p  cx  R sided neck surgery=  s/p ACDF 3-level R sided approach (C5-C7)   May 2023 sensation of choking when eating unless holding neck straight seems to be improving with ST eval neg 03/18/2023   Upper airway cough syndrome Onset spring 2024 in pt s/p  R sided approach (C5-C7)   May 2023  with sensation of choking when swallowing unless hold head up staight but neg ST eval / ent eval summer of 2024  - 06/11/2023 rec max gerd rx/ gabapentin 100 mg qid plus 1st gen H1 blockers per guidelines     The sensation of choking is not typical of cough variant asthma though this remains in ddx   More likely this is Upper airway cough syndrome (previously labeled PNDS),  is so named because it's frequently impossible to sort out how much is  CR/sinusitis with freq throat clearing (which can be related to primary GERD)   vs  causing  secondary (" extra esophageal")  GERD from wide swings in gastric pressure that occur with throat clearing, often  promoting self use of mint and menthol lozenges that reduce the lower esophageal sphincter tone and exacerbate the problem further in a cyclical fashion.    These are the same pts (now being labeled as having "irritable larynx syndrome" by some cough centers) who not infrequently have a history of having failed to tolerate ace inhibitors,  dry powder inhalers or biphosphonates or report having atypical/extraesophageal reflux symptoms(from LPR)  that don't respond to standard doses of PPI  and are easily confused as having aecopd or asthma flares by even experienced allergists/ pulmonologists (myself included).    Assessment/Plan: Encounter Diagnoses  Name Primary?   Chronic cough Yes   Gastroesophageal reflux disease without esophagitis    Laryngospasm     62 year old female  with history of MS, asthma, and history of right-sided approach ACDF several years ago who is here for initial evaluation of episodes of throat tightness, dry cough and trouble swallowing pills.  On exam including flexible laryngoscopy no evidence of masses tumors or mucosal changes and there was no evidence of pooled secretions in the piriform or postcricoid area to suggest severe problem with swallowing.  Cranial nerve exam was unremarkable.  She did have evidence of GERD LPR on flexible scope exam and we will initiate diet lifestyle changes as well as famotidine reflux Gourmet combination to control that.  My differential diagnosis for symptoms includes oropharyngeal versus esophageal dysphagia versus laryngospasm in the setting of untreated GERD LPR versus VCD/PVFMD in the setting of history of asthma and untreated GERD LPR.  Since she has never seen pulmonary for her symptoms we will make referral to ensure  she had asthma workup and her asthma if she has a history of adequately.  I counseled the patient on rescue breathing techniques that she can utilize during episodes to see if it gives her any relief.   - try reflux gourmet and start Famotidine, diet/lifestyle changes to minimize reflux - try rescue breathing technique for your sx (breath in through the nose, breath out through your mouth) - Pulmonary referral  - schedule swallow study - MBS/esophagram - return in 3-4 months   Update 07/03/23 She reports that her cough is somewhat better. She is on Claritin. She would like to not eat as late as she does. She feels that taking   Chronic cough - currently sx are improving per report, could be 2/2 GERD/LPR and hyper-irritable larynx. Seen Pulm already who suspected this was the cause as well. Her sx were not typical for asthma per Pulm office note I reviewed. Subjectively better after initiation of GERD medications. She admits to eating late dinners and we discussed importance of diet and lifestyle  changes to minimize GERD LPR - continue Protonix 40 mg in am and Famotidine 20 mg QHS and reflux gourmet  - diet and lifestyle changes to minimize GERD LPR - continue Flonase 2 puffs b/l nares BID and Zyrtec 10 mg daily  - we discussed a referral to GI for further evaluation with pH probe testing and possible EGD and she would like to proceed - will send GI referral   2. Dysphagia - hx of ACDF - MBS/esophagram done - appears to have sx only with chin tuck per SLP note, advised to continue regular diet, and and stay upright while eating, no aspiration/penetration noted  3. Chronic nasal congestion, post-nasal drip and suspected environmental allergies - continue Flonase 2 puffs b/l nares BID and Zyrtec 10 mg daily - will consider allergy testing in the future   F/u  - GI referral  - RTC in 3-6 months   I spent 30 minutes in total face-to-face time and in reviewing records during pre-charting, more than 50% of which was spent in counseling and coordination of care, reviewing test results, reviewing medications and treatment regimen and/or in discussing or reviewing the diagnosis, the prognosis and treatment options. Pertinent laboratory and imaging test results that were available during this visit with the patient were reviewed by me and considered in my medical decision making (see chart for details).    Ashok Croon, MD Otolaryngology [facility] Phone: [phone] Fax: [phone]    07/03/2023, 1:42 PM

## 2023-07-06 DIAGNOSIS — M6281 Muscle weakness (generalized): Secondary | ICD-10-CM | POA: Diagnosis not present

## 2023-07-06 DIAGNOSIS — H81392 Other peripheral vertigo, left ear: Secondary | ICD-10-CM | POA: Diagnosis not present

## 2023-07-06 DIAGNOSIS — Z9181 History of falling: Secondary | ICD-10-CM | POA: Diagnosis not present

## 2023-07-08 ENCOUNTER — Ambulatory Visit (INDEPENDENT_AMBULATORY_CARE_PROVIDER_SITE_OTHER): Payer: BC Managed Care – PPO

## 2023-07-08 DIAGNOSIS — G35 Multiple sclerosis: Secondary | ICD-10-CM

## 2023-07-08 MED ORDER — GADOBENATE DIMEGLUMINE 529 MG/ML IV SOLN
20.0000 mL | Freq: Once | INTRAVENOUS | Status: AC | PRN
  Administered 2023-07-08: 20 mL via INTRAVENOUS

## 2023-07-10 ENCOUNTER — Other Ambulatory Visit: Payer: Self-pay

## 2023-07-10 ENCOUNTER — Encounter (HOSPITAL_BASED_OUTPATIENT_CLINIC_OR_DEPARTMENT_OTHER): Payer: Self-pay | Admitting: Emergency Medicine

## 2023-07-10 ENCOUNTER — Inpatient Hospital Stay (HOSPITAL_BASED_OUTPATIENT_CLINIC_OR_DEPARTMENT_OTHER)
Admission: EM | Admit: 2023-07-10 | Discharge: 2023-07-11 | DRG: 059 | Disposition: A | Discharge status: Home / Self Care | Payer: BC Managed Care – PPO | Attending: Internal Medicine | Admitting: Internal Medicine

## 2023-07-10 ENCOUNTER — Other Ambulatory Visit (HOSPITAL_BASED_OUTPATIENT_CLINIC_OR_DEPARTMENT_OTHER): Payer: Self-pay

## 2023-07-10 DIAGNOSIS — Z791 Long term (current) use of non-steroidal anti-inflammatories (NSAID): Secondary | ICD-10-CM | POA: Diagnosis not present

## 2023-07-10 DIAGNOSIS — G2581 Restless legs syndrome: Secondary | ICD-10-CM | POA: Diagnosis not present

## 2023-07-10 DIAGNOSIS — G4761 Periodic limb movement disorder: Secondary | ICD-10-CM | POA: Diagnosis not present

## 2023-07-10 DIAGNOSIS — D849 Immunodeficiency, unspecified: Secondary | ICD-10-CM | POA: Diagnosis present

## 2023-07-10 DIAGNOSIS — Z8249 Family history of ischemic heart disease and other diseases of the circulatory system: Secondary | ICD-10-CM | POA: Diagnosis not present

## 2023-07-10 DIAGNOSIS — I10 Essential (primary) hypertension: Secondary | ICD-10-CM | POA: Diagnosis present

## 2023-07-10 DIAGNOSIS — F32A Depression, unspecified: Secondary | ICD-10-CM | POA: Diagnosis not present

## 2023-07-10 DIAGNOSIS — Z880 Allergy status to penicillin: Secondary | ICD-10-CM | POA: Diagnosis not present

## 2023-07-10 DIAGNOSIS — T380X5A Adverse effect of glucocorticoids and synthetic analogues, initial encounter: Secondary | ICD-10-CM | POA: Diagnosis not present

## 2023-07-10 DIAGNOSIS — J45909 Unspecified asthma, uncomplicated: Secondary | ICD-10-CM | POA: Diagnosis not present

## 2023-07-10 DIAGNOSIS — E669 Obesity, unspecified: Secondary | ICD-10-CM | POA: Diagnosis present

## 2023-07-10 DIAGNOSIS — R531 Weakness: Secondary | ICD-10-CM | POA: Diagnosis not present

## 2023-07-10 DIAGNOSIS — R058 Other specified cough: Secondary | ICD-10-CM

## 2023-07-10 DIAGNOSIS — G35 Multiple sclerosis: Principal | ICD-10-CM | POA: Diagnosis present

## 2023-07-10 DIAGNOSIS — M5010 Cervical disc disorder with radiculopathy, unspecified cervical region: Principal | ICD-10-CM

## 2023-07-10 DIAGNOSIS — Z79899 Other long term (current) drug therapy: Secondary | ICD-10-CM

## 2023-07-10 LAB — CBC
HCT: 39.2 % (ref 36.0–46.0)
HCT: 40 % (ref 36.0–46.0)
Hemoglobin: 13.4 g/dL (ref 12.0–15.0)
Hemoglobin: 13.5 g/dL (ref 12.0–15.0)
MCH: 30.7 pg (ref 26.0–34.0)
MCH: 29.7 pg (ref 26.0–34.0)
MCHC: 34.2 g/dL (ref 30.0–36.0)
MCHC: 33.8 g/dL (ref 30.0–36.0)
MCV: 89.7 fL (ref 80.0–100.0)
MCV: 87.9 fL (ref 80.0–100.0)
Platelets: 246 10*3/uL (ref 150–400)
Platelets: 248 K/uL (ref 150–400)
RBC: 4.37 MIL/uL (ref 3.87–5.11)
RBC: 4.55 MIL/uL (ref 3.87–5.11)
RDW: 12.4 % (ref 11.5–15.5)
RDW: 12.3 % (ref 11.5–15.5)
WBC: 4.8 10*3/uL (ref 4.0–10.5)
WBC: 4.8 K/uL (ref 4.0–10.5)
nRBC: 0 % (ref 0.0–0.2)
nRBC: 0 % (ref 0.0–0.2)

## 2023-07-10 LAB — URINALYSIS, ROUTINE W REFLEX MICROSCOPIC
Bilirubin Urine: NEGATIVE
Glucose, UA: NEGATIVE mg/dL
Hgb urine dipstick: NEGATIVE
Ketones, ur: NEGATIVE mg/dL
Leukocytes,Ua: NEGATIVE
Nitrite: NEGATIVE
Protein, ur: NEGATIVE mg/dL
Specific Gravity, Urine: 1.017 (ref 1.005–1.030)
pH: 7 (ref 5.0–8.0)

## 2023-07-10 LAB — CREATININE, SERUM
Creatinine, Ser: 0.65 mg/dL (ref 0.44–1.00)
GFR, Estimated: >60 mL/min

## 2023-07-10 LAB — COMPREHENSIVE METABOLIC PANEL
ALT: 13 U/L (ref 0–44)
AST: 15 U/L (ref 15–41)
Albumin: 4.1 g/dL (ref 3.5–5.0)
Alkaline Phosphatase: 87 U/L (ref 38–126)
Anion gap: 6 (ref 5–15)
BUN: 15 mg/dL (ref 6–20)
CO2: 29 mmol/L (ref 22–32)
Calcium: 9.4 mg/dL (ref 8.9–10.3)
Chloride: 101 mmol/L (ref 98–111)
Creatinine, Ser: 0.75 mg/dL (ref 0.44–1.00)
GFR, Estimated: >60 mL/min (ref >60)
Glucose, Bld: 90 mg/dL (ref 70–99)
Potassium: 4 mmol/L (ref 3.5–5.1)
Sodium: 136 mmol/L (ref 135–145)
Total Bilirubin: 0.4 mg/dL (ref <1.2)
Total Protein: 7.2 g/dL (ref 6.5–8.1)

## 2023-07-10 LAB — HIV ANTIBODY (ROUTINE TESTING W REFLEX): HIV Screen 4th Generation wRfx: NONREACTIVE

## 2023-07-10 MED ORDER — ENOXAPARIN SODIUM 60 MG/0.6ML IJ SOSY
60.0000 mg | PREFILLED_SYRINGE | INTRAMUSCULAR | Status: DC
  Administered 2023-07-10 – 2023-07-11 (×2): 60 mg via SUBCUTANEOUS
  Filled 2023-07-10 (×2): qty 0.6

## 2023-07-10 MED ORDER — SODIUM CHLORIDE 0.9 % IV SOLN
1000.0000 mg | Freq: Once | INTRAVENOUS | Status: AC
  Administered 2023-07-10: 1000 mg via INTRAVENOUS
  Filled 2023-07-10: qty 16

## 2023-07-10 MED ORDER — ACETAMINOPHEN 325 MG PO TABS
650.0000 mg | ORAL_TABLET | Freq: Four times a day (QID) | ORAL | Status: DC | PRN
  Administered 2023-07-11 (×2): 650 mg via ORAL
  Filled 2023-07-10 (×2): qty 2

## 2023-07-10 MED ORDER — METHYLPREDNISOLONE SODIUM SUCC 125 MG IJ SOLR
INTRAMUSCULAR | Status: AC
  Filled 2023-07-10: qty 16

## 2023-07-10 MED ORDER — METHYLPREDNISOLONE SODIUM SUCC 125 MG IJ SOLR
1000.0000 mg | Freq: Once | INTRAMUSCULAR | 0 refills | Status: DC
  Filled 2023-07-10: qty 1, fill #0

## 2023-07-10 MED ORDER — METHOCARBAMOL 500 MG PO TABS
500.0000 mg | ORAL_TABLET | Freq: Once | ORAL | Status: AC
  Administered 2023-07-11: 500 mg via ORAL
  Filled 2023-07-10: qty 1

## 2023-07-10 MED ORDER — SODIUM CHLORIDE 0.9 % IV SOLN
1000.0000 mg | Freq: Every day | INTRAVENOUS | Status: DC
  Administered 2023-07-11: 1000 mg via INTRAVENOUS
  Filled 2023-07-10: qty 16

## 2023-07-10 MED ORDER — ACETAMINOPHEN 650 MG RE SUPP: 650.0000 mg | Freq: Four times a day (QID) | RECTAL | Status: DC | PRN

## 2023-07-10 MED ORDER — PANTOPRAZOLE SODIUM 40 MG PO TBEC
40.0000 mg | DELAYED_RELEASE_TABLET | Freq: Every day | ORAL | Status: DC
  Administered 2023-07-10 – 2023-07-11 (×2): 40 mg via ORAL
  Filled 2023-07-10 (×2): qty 1

## 2023-07-10 MED ORDER — ENOXAPARIN SODIUM 40 MG/0.4ML IJ SOSY: 40.0000 mg | PREFILLED_SYRINGE | INTRAMUSCULAR | Status: DC

## 2023-07-10 NOTE — Progress Notes (Signed)
TRH night cross cover note:   I was notified by RN of the patient's request for medication for restless legs.  I subsequently placed a one-time order for Robaxin.     Newton Pigg, DO Hospitalist

## 2023-07-10 NOTE — Plan of Care (Signed)
Patient alert/oriented X4. Patient skin assessed with Maxie Better., RN. No skin issues noted. Patient needs moderate assistance while ambulating in room due to MS. Patient personal belongings packed up at bedside. No complaints at this time. VSS.

## 2023-07-10 NOTE — ED Triage Notes (Signed)
Pt reports having MS, reports new lesion and is requesting infusion

## 2023-07-10 NOTE — H&P (Signed)
History and Physical    Patient: [name] ZOX:[id number] DOB: [birth year] DOA: 07/10/2023 DOS: the patient was seen and examined on 07/10/2023 PCP: Collene Mares, PA  Patient coming from: Home  Chief Complaint:  Chief Complaint  Patient presents with   MS Flareup   HPI: [name] is a 62 y.o. female with medical history significant of multiple sclerosis, HTN, depression, obesity who presented with new MS lesions. Patient reports noting fatigue and LLE weakness that started about 2 months prior to admit, resulting in instability.  Pt also noted lightheadedness. Symptoms were noted to be progressive, prompting Neurology visit, where pt underwent MRI brain and cervical spine with findings concerning for new lesions in brain and at C7. Pt was referred to ED. In ED, pt did receive 1g solumedrol. Neurology was called and pt was accepted in direct admit for further management of MS.  Review of Systems: As mentioned in the history of present illness. All other systems reviewed and are negative. Past Medical History:  Diagnosis Date   Asthma    Back pain    Cardiac/pericardial tamponade 06/11/2014   s/p pericardial window 06/16/2014 per Dr. Maren Beach   Depression    Hypertension    MS (multiple sclerosis) (HCC)    Muscle pain    Obesity    Periodic limb movement disorder (PLMD) 07/15/2016   Pleural effusion, bilateral 06/30/2014   Uveitis    Bilateral   Past Surgical History:  Procedure Laterality Date   CERVICAL FUSION     01-02-21, [place] C5-7   microdisectomy  2019   lumbar spine   PERICARDIAL TAP N/A 06/11/2014   Procedure: PERICARDIAL TAP;  Surgeon: Micheline Chapman, MD;  Location[facility] CATH LAB;  Service: Cardiovascular;  Laterality: N/A;   SUBXYPHOID PERICARDIAL WINDOW N/A 06/16/2014   Procedure: SUBXYPHOID PERICARDIAL WINDOW;  Surgeon: Kerin Perna, MD;  Location: [facility] OR;  Service: Thoracic;  Laterality: N/A;   TEE WITHOUT CARDIOVERSION N/A 06/16/2014   Procedure:  TRANSESOPHAGEAL ECHOCARDIOGRAM (TEE);  Surgeon: Kerin Perna, MD;  Location: [facility] OR;  Service: Thoracic;  Laterality: N/A;   uterine ablasion  2012   Social History:  reports that she has never smoked. She has never used smokeless tobacco. She reports current alcohol use of about 3.0 standard drinks of alcohol per week. She reports current drug use.  Allergies  Allergen Reactions   Penicillins Rash    Family History  Problem Relation Age of Onset   Breast cancer Mother    Hypertension Mother    Diabetes Mother    Heart attack Father    Epilepsy Father    Endocrine tumor Brother    Multiple sclerosis Neg Hx     Prior to Admission medications   Medication Sig Start Date End Date Taking? Authorizing Provider  amLODipine (NORVASC) 5 MG tablet Take 5 mg by mouth daily. 07/08/23  Yes [provider]  losartan (COZAAR) 100 MG tablet Take 100 mg by mouth daily. 07/02/23  Yes [provider]  ALPRAZolam Prudy Feeler) 0.5 MG tablet Take 1 tablet 1 hour prior to MRI and another if needed when arrives at site. (Pt must have driver) 03/11/18   Sater, Pearletha Furl, MD  Armodafinil 150 MG tablet 1/2 tablet to 1 tablet po qAM as needed 06/08/23   [name], NP  atorvastatin (LIPITOR) 10 MG tablet 10 mg daily.    [provider]  baclofen (LIORESAL) 10 MG tablet Take 0.5 tablets (5 mg total) by mouth at  bedtime as needed for muscle spasms. 06/08/23   [name], NP  buPROPion (WELLBUTRIN XL) 150 MG 24 hr tablet Take 150 mg by mouth every morning. 03/21/22   [provider]  escitalopram (LEXAPRO) 10 MG tablet Take 5 mg by mouth daily.    [provider]  famotidine (PEPCID) 20 MG tablet Take 1 tablet (20 mg total) by mouth 2 (two) times daily. 07/03/23   Ashok Croon, MD  fluticasone (FLONASE) 50 MCG/ACT nasal spray Place 2 sprays into both nostrils 2 (two) times daily. 07/03/23   Ashok Croon, MD  gabapentin (NEURONTIN) 100 MG capsule TAKE 1  CAPSULE BY MOUTH ONCE A DAY AT BEDTIME FOR 3 DAYS THEN INCREASE TO THREE TIMES A DAY 11/29/22   [provider]  ibuprofen (ADVIL,MOTRIN) 200 MG tablet Take 400 mg by mouth every 6 (six) hours as needed.    [provider]  losartan (COZAAR) 50 MG tablet Take 1 tablet by mouth daily. 09/08/15   [provider]  meloxicam (MOBIC) 15 MG tablet Take 1 tablet (15 mg total) by mouth daily. Patient not taking: Reported on 07/03/2023 08/14/22   Ernestene Kiel T, DPM  meloxicam (MOBIC) 15 MG tablet Take 1 tablet (15 mg total) by mouth daily. 03/17/23   Hyatt, Max T, DPM  Ocrelizumab (OCREVUS IV) Inject into the vein every 6 (six) months. 1st dose 11-2020    [provider]  pantoprazole (PROTONIX) 40 MG tablet Take 1 tablet (40 mg total) by mouth daily. Take 30-60 min before first meal of the day 06/11/23   Nyoka Cowden, MD  pramipexole (MIRAPEX) 0.25 MG tablet TAKE 1 TABLET BY MOUTH 2 TIMES DAILY AS NEEDED. 08/28/22   [name], NP  Pramipexole Dihydrochloride 0.375 MG TB24 TAKE 1 TABLET (0.375 MG TOTAL) BY MOUTH AT BEDTIME. 12/02/22   [name], NP  prednisoLONE acetate (PRED FORTE) 1 % ophthalmic suspension As needed    [provider]  VENTOLIN HFA 108 (90 Base) MCG/ACT inhaler  12/17/15   [provider]    Physical Exam: Vitals:   07/10/23 1213 07/10/23 1400 07/10/23 1636  BP: (!) 150/75 128/79 (!) 149/81  Pulse: 61 (!) 58 63  Resp: 18  16  Temp: 97.7 F (36.5 C)  97.9 F (36.6 C)  TempSrc:   Oral  SpO2: 100% 100% 100%   General exam: Awake, laying in bed, in nad Respiratory system: Normal respiratory effort, no wheezing Cardiovascular system: regular rate, s1, s2 Gastrointestinal system: Soft, nondistended, positive BS Central nervous system: CN2-12 grossly intact, strength intact Extremities: Perfused, no clubbing Skin: Normal skin turgor, no notable skin lesions seen Psychiatry: Mood normal // no visual hallucinations    Data  Reviewed:  Labs reviewed: Na 136, K 4.0, Cr 0.75, WBC 4.8, Hgb 13.4, Plts 246   Assessment and Plan: MS -Pt known to Neurology as outpatient.  -MRI brain and cervical spine notable for new lesion in brain and at C7, reviewed -Neurology consulted, will f/u on recs -Pt has received 1g solumedrol in ED today -Will consult PT/OT  HTN -BP stable -Cont home regimen as tolerated  Depression -Seems stable   Obesity -Recommend diet/lifestyle modification   Advance Care Planning:   Code Status: Full Code Full, confirmed with patient  Consults: Neurology  Family Communication: Family at bedside  Severity of Illness: The appropriate patient status for this patient is INPATIENT. Inpatient status is judged to be reasonable and necessary in order to provide the required intensity of  service to ensure the patient's safety. The patient's presenting symptoms, physical exam findings, and initial radiographic and laboratory data in the context of their chronic comorbidities is felt to place them at high risk for further clinical deterioration. Furthermore, it is not anticipated that the patient will be medically stable for discharge from the hospital within 2 midnights of admission.   * I certify that at the point of admission it is my clinical judgment that the patient will require inpatient hospital care spanning beyond 2 midnights from the point of admission due to high intensity of service, high risk for further deterioration and high frequency of surveillance required.*  Author: Rickey Barbara, MD 07/10/2023 5:34 PM  For on call review [email or web address].

## 2023-07-10 NOTE — ED Notes (Signed)
[place] with cl called for transport

## 2023-07-10 NOTE — ED Provider Notes (Signed)
[facility] EMERGENCY DEPARTMENT AT [facility] Provider Note   CSN: [id number] Arrival date & time: 07/10/23  1203     History  Chief Complaint  Patient presents with   MS Flareup    [name] is a 62 y.o. female.  HPI   62 year old female presents emergency department with complaints of MS flareup.  Patient with history of MS on Ocrevus with most recent infusion 2 days ago.  States that over the past 2 months, has had an intermittent "dizzy episodes", left leg weakness, gait deviation to the left.  Describes dizziness as "feeling as if I am about to fall over."  Denies any room spinning dizziness or feelings of faintness/lightheadedness.  States that the symptoms occur at random times and she does not currently feel this.  States that she has had persistent feelings of left leg weakness over the past 2 months without any acute change as well as feelings of that she is walking to the left due to her left leg weakness.  Also reports generalized fatigue.  Denies any fever, cough, congestion, chest pain, shortness of breath, abdominal pain, nausea, vomiting.  Denies any visual symptoms, sensory deficits in upper lower extremities, other weakness in upper or lower extremities besides left leg, slurred speech, facial droop.  Past medical history significant for MS on Ocrevus, depression, hypertension, periodic limb movement disorder, pleural effusion, cardiac tamponade, pleural effusion, cervical disc disorder with radiculopathy  Home Medications Prior to Admission medications   Medication Sig Start Date End Date Taking? Authorizing Provider  amLODipine (NORVASC) 5 MG tablet Take 5 mg by mouth daily. 07/08/23  Yes [provider]  losartan (COZAAR) 100 MG tablet Take 100 mg by mouth daily. 07/02/23  Yes [provider]  ALPRAZolam Prudy Feeler) 0.5 MG tablet Take 1 tablet 1 hour prior to MRI and another if needed when arrives at site. (Pt must have driver) [birth year]    Sater, Pearletha Furl, MD  Armodafinil 150 MG tablet 1/2 tablet to 1 tablet po qAM as needed 06/08/23   [name], NP  atorvastatin (LIPITOR) 10 MG tablet 10 mg daily.    [provider]  baclofen (LIORESAL) 10 MG tablet Take 0.5 tablets (5 mg total) by mouth at bedtime as needed for muscle spasms. 06/08/23   [name], NP  buPROPion (WELLBUTRIN XL) 150 MG 24 hr tablet Take 150 mg by mouth every morning. 03/21/22   [provider]  escitalopram (LEXAPRO) 10 MG tablet Take 5 mg by mouth daily.    [provider]  famotidine (PEPCID) 20 MG tablet Take 1 tablet (20 mg total) by mouth 2 (two) times daily. 07/03/23   Ashok Croon, MD  fluticasone (FLONASE) 50 MCG/ACT nasal spray Place 2 sprays into both nostrils 2 (two) times daily. 07/03/23   Ashok Croon, MD  gabapentin (NEURONTIN) 100 MG capsule TAKE 1 CAPSULE BY MOUTH ONCE A DAY AT BEDTIME FOR 3 DAYS THEN INCREASE TO THREE TIMES A DAY 11/29/22   [provider]  ibuprofen (ADVIL,MOTRIN) 200 MG tablet Take 400 mg by mouth every 6 (six) hours as needed.    [provider]  losartan (COZAAR) 50 MG tablet Take 1 tablet by mouth daily. 09/08/15   [provider]  meloxicam (MOBIC) 15 MG tablet Take 1 tablet (15 mg total) by mouth daily. Patient not taking: Reported on 07/03/2023 08/14/22   Ernestene Kiel T, DPM  meloxicam (MOBIC) 15 MG tablet Take 1 tablet (15 mg total) by mouth daily. 03/17/23  Hyatt, Max T, DPM  Ocrelizumab (OCREVUS IV) Inject into the vein every 6 (six) months. 1st dose 11-2020    [provider]  pantoprazole (PROTONIX) 40 MG tablet Take 1 tablet (40 mg total) by mouth daily. Take 30-60 min before first meal of the day 06/11/23   Nyoka Cowden, MD  pramipexole (MIRAPEX) 0.25 MG tablet TAKE 1 TABLET BY MOUTH 2 TIMES DAILY AS NEEDED. 08/28/22   [name], NP  Pramipexole Dihydrochloride 0.375 MG TB24 TAKE 1 TABLET (0.375 MG TOTAL) BY MOUTH AT BEDTIME. 12/02/22    [name], NP  prednisoLONE acetate (PRED FORTE) 1 % ophthalmic suspension As needed    [provider]  VENTOLIN HFA 108 (90 Base) MCG/ACT inhaler  12/17/15   [provider]      Allergies    Penicillins    Review of Systems   Review of Systems  All other systems reviewed and are negative.   Physical Exam Updated Vital Signs BP (!) 149/81 (BP Location: Left Arm)   Pulse 63   Temp 97.9 F (36.6 C) (Oral)   Resp 16   SpO2 100%  Physical Exam Vitals and nursing note reviewed.  Constitutional:      General: She is not in acute distress.    Appearance: She is well-developed.  HENT:     Head: Normocephalic and atraumatic.  Eyes:     Conjunctiva/sclera: Conjunctivae normal.  Cardiovascular:     Rate and Rhythm: Normal rate and regular rhythm.     Heart sounds: No murmur heard. Pulmonary:     Effort: Pulmonary effort is normal. No respiratory distress.     Breath sounds: Normal breath sounds. No wheezing, rhonchi or rales.  Abdominal:     Palpations: Abdomen is soft.     Tenderness: There is no abdominal tenderness.  Musculoskeletal:        General: No swelling.     Cervical back: Neck supple.  Skin:    General: Skin is warm and dry.     Capillary Refill: Capillary refill takes less than 2 seconds.  Neurological:     Mental Status: She is alert.     Comments: Alert and oriented to self, place, time and event.   Speech is fluent, clear without dysarthria or dysphasia.   Strength 5/5 in upper extremities.  Patient with 4/5 muscular strength knee flexion/extension left, 5 out of 5 on right.  Otherwise, lower extremity muscular strength 5 out of 5. Sensation intact in upper/lower extremities   Patient with leftward bent gait Negative Romberg. No pronator drift.  Normal finger-to-nose and feet tapping.  CN I not tested  CN II not tested CN III, IV, VI PERRLA and EOMs intact bilaterally  CN V Intact sensation to sharp and light touch to the face   CN VII facial movements symmetric  CN VIII not tested  CN IX, X no uvula deviation, symmetric rise of soft palate  CN XI 5/5 SCM and trapezius strength bilaterally  CN XII Midline tongue protrusion, symmetric L/R movements     Psychiatric:        Mood and Affect: Mood normal.     ED Results / Procedures / Treatments   Labs (all labs ordered are listed, but only abnormal results are displayed) Labs Reviewed  COMPREHENSIVE METABOLIC PANEL  CBC  URINALYSIS, ROUTINE W REFLEX MICROSCOPIC    EKG None  Radiology No results found.  Procedures .Critical Care  Performed by: Peter Garter, PA Authorized by:  Peter Garter, [place]   Critical care provider statement:    Critical care time (minutes):  47   Critical care was necessary to treat or prevent imminent or life-threatening deterioration of the following conditions:  CNS failure or compromise   Critical care was time spent personally by me on the following activities:  Development of treatment plan with patient or surrogate, discussions with consultants, evaluation of patient's response to treatment, examination of patient, ordering and review of laboratory studies, ordering and review of radiographic studies, ordering and performing treatments and interventions, pulse oximetry, re-evaluation of patient's condition and review of old charts   I assumed direction of critical care for this patient from another provider in my specialty: no       Medications Ordered in ED Medications  methylPREDNISolone sodium succinate (SOLU-MEDROL) 1,000 mg in sodium chloride 0.9 % 50 mL IVPB (0 mg Intravenous Stopped 07/10/23 1555)  methylPREDNISolone sodium succinate (SOLU-MEDROL) 125 mg/2 mL injection (  Given 07/10/23 1432)    ED Course/ Medical Decision Making/ A&P Clinical Course as of 07/10/23 1642  Fri Jul 10, 2023  1319 Consulted Dr. Otelia Limes who recommended beginning of IV Solu-Medrol and admission through hospitalist for  treatment of MS flareup [CR]  1639 Consulted hospitalist Dr. Rhona Leavens who agreed with admission and assume further treatment/care. [CR]    Clinical Course User Index [CR] Peter Garter, PA                                 Medical Decision Making Amount and/or Complexity of Data Reviewed Labs: ordered.  Risk Decision regarding hospitalization.   This patient presents to the ED for concern of MS flareup, this involves an extensive number of treatment options, and is a complaint that carries with it a high risk of complications and morbidity.  The differential diagnosis includes MS, CVA, electrolyte derangement, cauda equina, spinal cord compression/impingement, other   Co morbidities that complicate the patient evaluation  See HPI   Additional history obtained:  Additional history obtained from EMR External records from outside source obtained and reviewed including hospital records   Lab Tests:  I Ordered, and personally interpreted labs.  The pertinent results include: No leukocytosis.  No evidence of anemia.  Platelets within range.  No Electra abnormalities.  No transaminitis.  No renal dysfunction.  UA without abnormality.   Imaging Studies ordered:  N/a   Cardiac Monitoring: / EKG:  The patient was maintained on a cardiac monitor.  I personally viewed and interpreted the cardiac monitored which showed an underlying rhythm of: Sinus rhythm   Consultations Obtained:  See ED course  Problem List / ED Course / Critical interventions / Medication management  Multiple sclerosis exacerbation I ordered medication including Solu-Medrol   Reevaluation of the patient after these medicines showed that the patient improved I have reviewed the patients home medicines and have made adjustments as needed   Social Determinants of Health:  Denies tobacco, licit drug use   Test / Admission - Considered:  Multiple sclerosis exacerbation Vitals signs significant for  hypertension blood pressure 149/81. Otherwise within normal range and stable throughout visit. Laboratory studies significant for: See above 62 year old female presents emergency department with complaints of "MS flareup."  For the past 2 months, has had intermittent dizzy episodes, left lower extremity weakness as well as gait deviation to the left.  On exam, patient with appreciable weakness left leg and knee flexion/extension  when compared contralaterally.  Leftward bent deviation when walking.  Outpatient MRI findings from 07/08/2023 concerning for new enhancing lesion left periventricular region.  Consulted neurology who agreed with admission and treatment for MS flareup.  Consulted hospitalist who agreed with admission. Treatment plan were discussed at length with patient and they knowledge understanding was agreeable to said plan.  Appropriate consultations were made as described in the ED course.  Patient was stable upon admission to the hospital.         Final Clinical Impression(s) / ED Diagnoses Final diagnoses:  Multiple sclerosis exacerbation (HCC)    Rx / DC Orders ED Discharge Orders          Ordered    methylPREDNISolone sodium succinate (SOLU-MEDROL) 125 mg/2 mL injection   Once,   Status:  Discontinued        07/10/23 1317              Peter Garter, PA 07/10/23 1642    Gwyneth Sprout, MD 07/11/23 [phone]

## 2023-07-10 NOTE — Consult Note (Signed)
NEUROLOGY CONSULT NOTE   Date of service: July 10, 2023 Patient Name: [name] Session MRN:  [id number] DOB:  1961/03/18 Chief Complaint: "New MS lesions and right-sided weakness" Requesting Provider: Jerald Kief, MD  History of Present Illness  [name] is a 62 y.o. female  has a past medical history of Asthma, Back pain, Cardiac/pericardial tamponade (06/11/2014), Depression, Hypertension, MS (multiple sclerosis) (HCC), Muscle pain, Obesity, Periodic limb movement disorder (PLMD) (07/15/2016), Pleural effusion, bilateral (06/30/2014), and Uveitis.   Over the past 2 months, patient has had episodes of dizziness and unsteady ambulation as well as subjective weakness on the left side.  She contacted her outpatient neurologist and had a MRI performed which demonstrated new lesions in the brain and cervical spine.  On exam, her right side is slightly weaker than her left with some mild sensory deficit on the right.  She will need a total of 5 days of high-dose Solu-Medrol and has received her first dose while at [facility].   ROS  Comprehensive ROS performed and pertinent positives documented in HPI    Past History   Past Medical History:  Diagnosis Date   Asthma    Back pain    Cardiac/pericardial tamponade 06/11/2014   s/p pericardial window 06/16/2014 per Dr. Maren Beach   Depression    Hypertension    MS (multiple sclerosis) (HCC)    Muscle pain    Obesity    Periodic limb movement disorder (PLMD) 07/15/2016   Pleural effusion, bilateral 06/30/2014   Uveitis    Bilateral    Past Surgical History:  Procedure Laterality Date   CERVICAL FUSION     01-02-21, [place] C5-7   microdisectomy  2019   lumbar spine   PERICARDIAL TAP N/A 06/11/2014   Procedure: PERICARDIAL TAP;  Surgeon: Micheline Chapman, MD;  Location: [facility] CATH LAB;  Service: Cardiovascular;  Laterality: N/A;   SUBXYPHOID PERICARDIAL WINDOW N/A 06/16/2014   Procedure: SUBXYPHOID PERICARDIAL WINDOW;  Surgeon: Kerin Perna, MD;  Location[facility] OR;  Service: Thoracic;  Laterality: N/A;   TEE WITHOUT CARDIOVERSION N/A 06/16/2014   Procedure: TRANSESOPHAGEAL ECHOCARDIOGRAM (TEE);  Surgeon: Kerin Perna, MD;  Location: [facility] OR;  Service: Thoracic;  Laterality: N/A;   uterine ablasion  2012    Family History: Family History  Problem Relation Age of Onset   Breast cancer Mother    Hypertension Mother    Diabetes Mother    Heart attack Father    Epilepsy Father    Endocrine tumor Brother    Multiple sclerosis Neg Hx     Social History  reports that she has never smoked. She has never used smokeless tobacco. She reports current alcohol use of about 3.0 standard drinks of alcohol per week. She reports current drug use.  Allergies  Allergen Reactions   Penicillins Rash    Medications   Current Facility-Administered Medications:    acetaminophen (TYLENOL) tablet 650 mg, 650 mg, Oral, Q6H PRN **OR** acetaminophen (TYLENOL) suppository 650 mg, 650 mg, Rectal, Q6H PRN, Jerald Kief, MD   enoxaparin (LOVENOX) injection 60 mg, 60 mg, Subcutaneous, Q24H, Reome, Earle J, RPH, 60 mg at 07/10/23 1751   [START ON 07/11/2023] methylPREDNISolone sodium succinate (SOLU-MEDROL) 1,000 mg in sodium chloride 0.9 % 50 mL IVPB, 1,000 mg, Intravenous, Daily, de La Torre, Cortney E, NP   pantoprazole (PROTONIX) EC tablet 40 mg, 40 mg, Oral, Daily, de Saintclair Halsted, [place] E, NP  Vitals   Vitals:   07/10/23 1213 07/10/23  1400 Jul 11, 2023 1636  BP: (!) 150/75 128/79 (!) 149/81  Pulse: 61 (!) 58 63  Resp: 18  16  Temp: 97.7 F (36.5 C)  97.9 F (36.6 C)  TempSrc:   Oral  SpO2: 100% 100% 100%    There is no height or weight on file to calculate BMI.  Physical Exam   Constitutional: Appears well-developed and well-nourished.  Psych: Affect appropriate to situation.  Eyes: No scleral injection.  HENT: No OP obstruction.  Head: Normocephalic.  Respiratory: Effort normal, non-labored breathing.  Skin: WDI.    Neurologic Examination    NEURO:  Mental Status: Alert and oriented to person place time and situation, able to give clear and coherent history of present illness Speech/Language: speech is without dysarthria or aphasia.    Cranial Nerves:  II: PERRL.  III, IV, VI: EOMI. Eyelids elevate symmetrically.  V: Sensation is intact to light touch and symmetrical to face.  VII: Smile is symmetrical.  VIII: hearing intact to voice. IX, X: Phonation is normal.  ZH:[id number] shrug 5/5. XII: tongue is midline without fasciculations. Motor: 5 out of 5 strength to left upper and lower extremities, proximal and distal, 5 out of 5 strength to right upper extremity, 4+ out of 5 strength to right lower extremity, proximally and distally Tone: is normal and bulk is normal Sensation- Intact to light touch bilaterally but very slightly diminished on the right  Coordination: FTN intact bilaterally.No drift.  Gait- deferred  Labs/Imaging/Neurodiagnostic studies   CBC:  Recent Labs  Lab 2023-07-11 1310 07-11-2023 1736  WBC 4.8 4.8  HGB 13.4 13.5  HCT 39.2 40.0  MCV 89.7 87.9  PLT 246 248   Basic Metabolic Panel:  Lab Results  Component Value Date   NA 136 11-Jul-2023   K 4.0 Jul 11, 2023   CO2 29 07/11/2023   GLUCOSE 90 07/11/23   BUN 15 11-Jul-2023   CREATININE 0.65 07/11/2023   CALCIUM 9.4 11-Jul-2023   GFRNONAA >60 07-11-2023   GFRAA 99 04/24/2020   INR  Lab Results  Component Value Date   INR 1.08 06/15/2014   APTT  Lab Results  Component Value Date   APTT 36 06/15/2014     MRI Brain and cervical spine (Personally reviewed): Multiple scattered periventricular, periatrial, juxtacortical and subtle brainstem cerebellar and white matter hyperintensities compatible with chronic demyelinating disease with solid enhancing lesion in the left periventricular region which appears new compared to previous MRI, new enhancing ventral spinal cord lesion at C7 which may represent active  demyelinating plaque  ASSESSMENT   [name] is a 62 y.o. female  has a past medical history of Asthma, Back pain, Cardiac/pericardial tamponade (06/11/2014), Depression, Hypertension, MS (multiple sclerosis) (HCC), Muscle pain, Obesity, Periodic limb movement disorder (PLMD) (07/15/2016), Pleural effusion, bilateral (06/30/2014), and Uveitis.  Patient presents with dizziness which has been present for about 2 months as well as subjective left-sided weakness and impaired ambulation.  She has recently started outpatient physical therapy in order to prevent falls.  She had contacted her outpatient neurologist regarding her dizziness and new left-sided weakness and underwent an outpatient MRI which showed new enhancing lesions in the brain and spinal cord.  She was admitted here for MS exacerbation and will need 5 days of high-dose steroids.  She will follow-up with Dr. Epimenio Foot as an outpatient after discharge.  RECOMMENDATIONS  -Solu-Medrol 1 g IV x 5 days, first dose already given today at [facility] -Protonix 40 mg p.o. daily while on high-dose steroids -May consider transition  to p.o. prednisone if patient able to tolerate and doing well -PT/OT evaluation - Will benefit from repeat outpatient evaluation for sleep apnea given reported excessive daytime sleepiness. ______________________________________________________________________    Signed, Cortney Harland Dingwall, NP [facility]   NEUROHOSPITALIST ADDENDUM Performed a face to face diagnostic evaluation.   I have reviewed the contents of history and physical exam as documented by PA/ARNP/Resident and agree with above documentation.  I have discussed and formulated the above plan as documented. Edits to the note have been made as needed.  Impression/Key exam findings/Plan: presents with weeks of BL lower ext weakness. with the noted enhancing lesion in her brain and C spine, will benefit from IV steroids. Reports she is on  ocrevius and her last dose was jsut 3 days ago.  Upon discussing with her and her wife, mentions poor sleep quality and excessive daytime sleepiness. Was offered CPAP 15 years ago but could not tolerate it. I discussed that she would benefit from repeat outpatient sleep evaluation.  She got first dose of steroids at [facility] ED. Will do her second dose tomorrow AM IV here. I think we should be able to get her out of the hospital tomorrow AM and do the rest of the steroids outpatient PO. Patient seems to be in agreement with this plan.  Erick Blinks, MD [facility] [phone]   If 7pm to 7am, please call on call as listed on AMION.

## 2023-07-11 ENCOUNTER — Other Ambulatory Visit (HOSPITAL_COMMUNITY): Payer: Self-pay

## 2023-07-11 DIAGNOSIS — G35 Multiple sclerosis: Secondary | ICD-10-CM | POA: Diagnosis not present

## 2023-07-11 DIAGNOSIS — M5010 Cervical disc disorder with radiculopathy, unspecified cervical region: Secondary | ICD-10-CM | POA: Diagnosis not present

## 2023-07-11 LAB — COMPREHENSIVE METABOLIC PANEL
ALT: 17 U/L (ref 0–44)
AST: 17 U/L (ref 15–41)
Albumin: 3.9 g/dL (ref 3.5–5.0)
Alkaline Phosphatase: 90 U/L (ref 38–126)
Anion gap: 10 (ref 5–15)
BUN: 16 mg/dL (ref 6–20)
CO2: 21 mmol/L — ABNORMAL LOW (ref 22–32)
Calcium: 9.3 mg/dL (ref 8.9–10.3)
Chloride: 103 mmol/L (ref 98–111)
Creatinine, Ser: 0.79 mg/dL (ref 0.44–1.00)
GFR, Estimated: >60 mL/min (ref >60)
Glucose, Bld: 161 mg/dL — ABNORMAL HIGH (ref 70–99)
Potassium: 3.8 mmol/L (ref 3.5–5.1)
Sodium: 134 mmol/L — ABNORMAL LOW (ref 135–145)
Total Bilirubin: 0.5 mg/dL (ref <1.2)
Total Protein: 7.2 g/dL (ref 6.5–8.1)

## 2023-07-11 LAB — CBC
HCT: 38.1 % (ref 36.0–46.0)
Hemoglobin: 13.3 g/dL (ref 12.0–15.0)
MCH: 30.7 pg (ref 26.0–34.0)
MCHC: 34.9 g/dL (ref 30.0–36.0)
MCV: 88 fL (ref 80.0–100.0)
Platelets: 251 10*3/uL (ref 150–400)
RBC: 4.33 MIL/uL (ref 3.87–5.11)
RDW: 12 % (ref 11.5–15.5)
WBC: 5.5 10*3/uL (ref 4.0–10.5)
nRBC: 0 % (ref 0.0–0.2)

## 2023-07-11 MED ORDER — PREDNISONE 50 MG PO TABS
1250.0000 mg | ORAL_TABLET | Freq: Every day | ORAL | 0 refills | Status: AC
  Filled 2023-07-11: qty 75, 3d supply, fill #0

## 2023-07-11 MED ORDER — LOSARTAN POTASSIUM 50 MG PO TABS
100.0000 mg | ORAL_TABLET | Freq: Every day | ORAL | Status: DC
  Administered 2023-07-11: 100 mg via ORAL
  Filled 2023-07-11: qty 2

## 2023-07-11 MED ORDER — ATORVASTATIN CALCIUM 10 MG PO TABS
10.0000 mg | ORAL_TABLET | Freq: Every day | ORAL | Status: DC
  Administered 2023-07-11: 10 mg via ORAL
  Filled 2023-07-11: qty 1

## 2023-07-11 MED ORDER — PANTOPRAZOLE SODIUM 40 MG PO TBEC: 40.0000 mg | DELAYED_RELEASE_TABLET | Freq: Every day | ORAL | Status: DC

## 2023-07-11 MED ORDER — PANTOPRAZOLE SODIUM 40 MG PO TBEC
40.0000 mg | DELAYED_RELEASE_TABLET | Freq: Two times a day (BID) | ORAL | 0 refills | Status: AC
  Filled 2023-07-11: qty 60, 30d supply, fill #0

## 2023-07-11 MED ORDER — AMLODIPINE BESYLATE 5 MG PO TABS
5.0000 mg | ORAL_TABLET | Freq: Every day | ORAL | Status: DC
  Administered 2023-07-11: 5 mg via ORAL
  Filled 2023-07-11: qty 1

## 2023-07-11 MED ORDER — CALCIUM CARBONATE ANTACID 500 MG PO CHEW: 1.0000 | CHEWABLE_TABLET | Freq: Three times a day (TID) | ORAL | Status: DC | PRN

## 2023-07-11 MED ORDER — BUPROPION HCL ER (XL) 150 MG PO TB24
150.0000 mg | ORAL_TABLET | Freq: Every morning | ORAL | Status: DC
  Administered 2023-07-11: 150 mg via ORAL
  Filled 2023-07-11: qty 1

## 2023-07-11 NOTE — Discharge Summary (Signed)
Physician Discharge Summary   Patient: [name] MRN: [id number] DOB: 04/03/1961  Admit date:     07/10/2023  Discharge date: 07/11/23  Discharge Physician: Rickey Barbara   PCP: Collene Mares, PA   Recommendations at discharge:   Follow up with PCP in 1-2 weeks Follow up with Neurology as scheduled  Discharge Diagnoses: Principal Problem:   Multiple sclerosis with acute neurologic event (HCC) Active Problems:   Multiple sclerosis (HCC)  Resolved Problems:   * No resolved hospital problems. *  Hospital Course: 62 y.o. female with medical history significant of multiple sclerosis, HTN, depression, obesity who presented with new MS lesions. Patient reports noting fatigue and LLE weakness that started about 2 months prior to admit, resulting in instability.  Pt also noted lightheadedness. Symptoms were noted to be progressive, prompting Neurology visit, where pt underwent MRI brain and cervical spine with findings concerning for new lesions in brain and at C7. Pt was referred to ED. In ED, pt did receive 1g solumedrol. Neurology was called and pt was accepted in direct admit for further management of MS.   Assessment and Plan: MS -Pt known to Neurology as outpatient.  -MRI brain and cervical spine notable for new lesion in brain and at C7, reviewed -Neurology consulted, pt received 1g solumedrol on 11/29, 11/30 -Discussed with Neurology. Recs to complete 1250mg  prednisone daily starting 12/1 x 3 days -Recommending increasing home protonix to BID dosing while on high dose steroids -Pt to f/u closely with Neurology as outpatient   HTN -BP stable -Cont home regimen as tolerated   Depression -Seems stable    Obesity -Recommend diet/lifestyle modification    Consultants: Neurology Procedures performed:   Disposition: Home Diet recommendation:  Regular diet DISCHARGE MEDICATION: Allergies as of 07/11/2023       Reactions   Penicillins Rash        Medication List      TAKE these medications    amLODipine 5 MG tablet Commonly known as: NORVASC Take 5 mg by mouth daily.   Armodafinil 150 MG tablet 1/2 tablet to 1 tablet po qAM as needed What changed:  how much to take how to take this when to take this reasons to take this additional instructions   atorvastatin 10 MG tablet Commonly known as: LIPITOR Take 10 mg by mouth daily.   baclofen 10 MG tablet Commonly known as: LIORESAL Take 0.5 tablets (5 mg total) by mouth at bedtime as needed for muscle spasms.   buPROPion 150 MG 24 hr tablet Commonly known as: WELLBUTRIN XL Take 150 mg by mouth every morning.   famotidine 20 MG tablet Commonly known as: PEPCID Take 1 tablet (20 mg total) by mouth 2 (two) times daily.   fluticasone 50 MCG/ACT nasal spray Commonly known as: FLONASE Place 2 sprays into both nostrils 2 (two) times daily.   gabapentin 100 MG capsule Commonly known as: NEURONTIN Take 100 mg by mouth.   ibuprofen 200 MG tablet Commonly known as: ADVIL Take 400 mg by mouth every 6 (six) hours as needed.   losartan 100 MG tablet Commonly known as: COZAAR Take 100 mg by mouth daily.   meloxicam 15 MG tablet Commonly known as: MOBIC Take 1 tablet (15 mg total) by mouth daily.   OCREVUS IV Inject into the vein every 6 (six) months. 1st dose 11-2020   pantoprazole 40 MG tablet Commonly known as: PROTONIX Take 1 tablet (40 mg total) by mouth 2 (two) times daily. What changed:  when  to take this additional instructions   pramipexole 0.25 MG tablet Commonly known as: MIRAPEX TAKE 1 TABLET BY MOUTH 2 TIMES DAILY AS NEEDED. What changed: See the new instructions.   Pramipexole Dihydrochloride 0.375 MG Tb24 TAKE 1 TABLET (0.375 MG TOTAL) BY MOUTH AT BEDTIME. What changed: Another medication with the same name was changed. Make sure you understand how and when to take each.   prednisoLONE acetate 1 % ophthalmic suspension Commonly known as: PRED FORTE As needed    predniSONE 50 MG tablet Commonly known as: DELTASONE Take 25 tablets (1,250 mg total) by mouth daily with breakfast for 3 days. Start taking on: July 12, 2023        Follow-up Information     [place], [place], [place] Follow up in 2 week(s).   Specialty: Internal Medicine Why: Hospital follow up Contact information: [address] [place] [address] [phone]                Discharge Exam: There were no vitals filed for this visit. General exam: Awake, laying in bed, in nad Respiratory system: Normal respiratory effort, no wheezing Cardiovascular system: regular rate, s1, s2 Gastrointestinal system: Soft, nondistended, positive BS Central nervous system: CN2-12 grossly intact, strength intact Extremities: Perfused, no clubbing Skin: Normal skin turgor, no notable skin lesions seen Psychiatry: Mood normal // no visual hallucinations    Condition at discharge: fair  The results of significant diagnostics from this hospitalization (including imaging, microbiology, ancillary and laboratory) are listed below for reference.   Imaging Studies: MR CERVICAL SPINE W WO CONTRAST  Result Date: 07/08/2023  [facility] [address], [address] [place], [place] [address] [phone] NEUROIMAGING REPORT STUDY DATE: 07/08/2023 PATIENT NAME: [name] DOB: 1961/02/28 MRN: [id number] ORDERING CLINICIAN: [name], NP CLINICAL HISTORY: 62 year old patient with multiple sclerosis for follow-up COMPARISON FILMS: MRI cervical spine without 11/19/2021 EXAM: MRI cervical spine with and without contrast TECHNIQUE: Sagittal T1, T2, STIR, axial T1, T2, GRE and postcontrast sagittal and axial T1 images were obtained through cervical spine CONTRAST: 20 mL IV MultiHance IMAGING SITE: [facility] at [address] FINDINGS: The cervical vertebrae demonstrate abnormal alignment with loss of forward lordotic curvature and postoperative changes of anterior cervical  fusion from C4-C7 with postop metallic artifact.  The canal dimensions however appear to be adequate throughout. There are minor disc signal abnormalities at C2/3 and C3-4 but without significant compression.  There is adequate patency of the canal and metallic artifacts from C4-C7.  There is minor disc bulge at C7-T1 but no compression.  Spinal canal dimensions are adequate throughout.  Ill-defined hyperintensities are noted in the spinal cord at C 1-2, C2/C3, ventrally at C4 and to a lesser extent at C 7.  There is a ventral hyperintensity at C7 in the spinal cord which shows postcontrast enhancement and likely represent an active demyelinating lesion.  Visualized portion of lower brainstem and upper thoracic vertebrae appear unremarkable.  Paraspinal soft tissue appear unremarkable.  Postcontrast images showed enhancing ventral spinal cord lesion at C7.   MRI scan cervical spine with and without contrast showing stable postoperative changes of anterior cervical fusion from C4-C7.  Ill-defined spinal cord hyperintensities are noted at T2 hyperintense foci within the spinal cord adjacent to C1-C2, C2, C2-C3 and C3-C4 which likely represent chronic demyelinating plaques.  There is a new enhancing ventral spinal cord lesion at C7 which likely represent active demyelinating plaque. INTERPRETING PHYSICIAN: Delia Heady, MD Certified in  Neuroimaging  by [employer] and [employer]  MR BRAIN W WO CONTRAST  Result Date: 07/08/2023  [facility] [address], [address] [place], [place] [address] [phone] NEUROIMAGING REPORT STUDY DATE: 07/08/2023 PATIENT NAME: Xayla Fornwalt DOB: 03/20/1961 MRN: [id number] ORDERING CLINICIAN: [name], NP CLINICAL HISTORY: 62 year old patient with multiple sclerosis COMPARISON FILMS: MRI brain without 11/19/2021 EXAM: MRI brain with and without contrast TECHNIQUE: Sagittal T1, FLAIR, axial T1, T2, FLAIR,  DWI, GRE, coronal T2 and postcontrast axial and coronal T1 images were obtained through the brain CONTRAST: 20 mL IV MultiHance IMAGING SITE: [facility] at [address] FINDINGS: The brain parenchyma shows multiple scattered periventricular, periatrial, juxtacortical, subtle brainstem and cerebellar T2/FLAIR white matter hyperintensities in a pattern and distribution compatible with chronic demyelinating disease.  There is a new left periventricular lesion noted which shows postcontrast peripheral enhancement presents active disease.  The presence of several T1 black holes indicates chronic disease as well.  No other structural lesion, tumor or infarct is noted.  Subarachnoid spaces and ventricular system appear normal.  Extra-axial brain structures appear normal.  Calvarium shows no abnormalities.  Orbits appear unremarkable.  Paranasal sinuses show only mild chronic mucosal thickening.  The pituitary gland and cerebellar tonsils appear normal.  Visualized portion of upper cervical spine shows postoperative changes of anterior cervical fusion C4-5 and subtle spinal cord hyperintensity is noted at C2 which may represent remote age demyelinating plaque.  Postcontrast images result in peripheral ring enhancement in the left frontal periventricular lesion.   MRI scan of the brain with and without contrast showing multiple scattered periventricular, periatrial, juxtacortical and subtle brainstem cerebellar and white matter hyperintensities compatible with chronic demyelinating disease.  Solitary enhancing lesion is noted in the left periventricular region which appears new compared to previous MRI from 11/19/2021 and represents active disease. INTERPRETING PHYSICIAN: Delia Heady, MD Certified in  Neuroimaging by [employer] Neuro[employer] and [place] [employer] Show [address] but [place] withdrawing    Microbiology: Results for orders placed or performed during the hospital encounter of  06/28/14  Culture, blood (routine x 2)     Status: None   Collection Time: 06/29/14  2:30 AM   Specimen: BLOOD  Result Value Ref Range Status   Specimen Description BLOOD LEFT ARM  Final   Special Requests BOTTLES DRAWN AEROBIC AND ANAEROBIC [facility] EACH  Final   Culture  Setup Time   Final    06/29/2014 12:41 Performed at [facility]    Culture   Final    NO GROWTH 5 DAYS Performed at [facility]    Report Status 07/05/2014 FINAL  Final  Culture, blood (routine x 2)     Status: None   Collection Time: 06/29/14  2:35 AM   Specimen: BLOOD  Result Value Ref Range Status   Specimen Description BLOOD RIGHT ARM  Final   Special Requests BOTTLES DRAWN AEROBIC AND ANAEROBIC [facility] EACH  Final   Culture  Setup Time   Final    06/29/2014 12:41 Performed at [facility]    Culture   Final    NO GROWTH 5 DAYS Performed at [facility]    Report Status 07/05/2014 FINAL  Final    Labs: CBC: Recent Labs  Lab 07/10/23 1310 07/10/23 1736 07/11/23 0355  WBC 4.8 4.8 5.5  HGB 13.4 13.5 13.3  HCT 39.2 40.0 38.1  MCV 89.7 87.9 88.0  PLT 246 248 251  Basic Metabolic Panel: Recent Labs  Lab 07/10/23 1310 07/10/23 1736 07/11/23 0355  NA 136  --  134*  K 4.0  --  3.8  CL 101  --  103  CO2 29  --  21*  GLUCOSE 90  --  161*  BUN 15  --  16  CREATININE 0.75 0.65 0.79  CALCIUM 9.4  --  9.3   Liver Function Tests: Recent Labs  Lab 07/10/23 1310 07/11/23 0355  AST 15 17  ALT 13 17  ALKPHOS 87 90  BILITOT 0.4 0.5  PROT 7.2 7.2  ALBUMIN 4.1 3.9   CBG: No results for input(s): "GLUCAP" in the last 168 hours.  Discharge time spent: less than 30 minutes.  Signed: Rickey Barbara, MD [facility] 07/11/2023

## 2023-07-11 NOTE — Plan of Care (Signed)
  Problem: Education: °Goal: Knowledge of General Education information will improve °Description: Including pain rating scale, medication(s)/side effects and non-pharmacologic comfort measures °Outcome: Progressing °  °Problem: Health Behavior/Discharge Planning: °Goal: Ability to manage health-related needs will improve °Outcome: Progressing °  °Problem: Clinical Measurements: °Goal: Ability to maintain clinical measurements within normal limits will improve °Outcome: Progressing °Goal: Will remain free from infection °Outcome: Progressing °Goal: Diagnostic test results will improve °Outcome: Progressing °  °Problem: Clinical Measurements: °Goal: Will remain free from infection °Outcome: Progressing °  °Problem: Clinical Measurements: °Goal: Diagnostic test results will improve °Outcome: Progressing °  °

## 2023-07-11 NOTE — Progress Notes (Signed)
NEUROLOGY CONSULT FOLLOW UP NOTE   Date of service: July 11, 2023 Patient Name: [name] MRN:  [id number] DOB:  Dec 06, 1960  Brief HPI  [name] is a 62 y.o. femalewith hxof MS, presents with weeks of BL lower ext weakness. with the noted enhancing lesion in her brain and C spine.   Interval Hx/subjective   No issues overnight. About to get second dose of IV solumedrol.  Vitals   Vitals:   07/11/23 0005 07/11/23 0454 07/11/23 0855 07/11/23 0907  BP: (!) 125/95 135/82  (!) 140/85  Pulse: 86 81  77  Resp:  18  20  Temp:  97.7 F (36.5 C) 97.9 F (36.6 C) 97.9 F (36.6 C)  TempSrc:  Oral Oral Oral  SpO2: 99% 99%  98%     There is no height or weight on file to calculate BMI.  Physical Exam   General: sitting comfortably at the side of the bed; in no acute distress.  HENT: Normal oropharynx and mucosa. Normal external appearance of ears and nose.  Neck: Supple, no pain or tenderness  CV: No JVD. No peripheral edema.  Pulmonary: Symmetric Chest rise. Normal respiratory effort.  Abdomen: Soft to touch, non-tender.  Ext: No cyanosis, edema, or deformity  Skin: No rash. Normal palpation of skin.   Musculoskeletal: Normal digits and nails by inspection. No clubbing.   Neurologic Examination  Mental status/Cognition: Alert, oriented to self, place, month and year, good attention. Speech/language: Fluent, comprehension intact, object naming intact, repetition intact.  Limited neuro exam with improved BL hip flexion today to 4+/5. No deficit noted on neuro exam last evening so did not repeat it.  Medications  Current Facility-Administered Medications:    acetaminophen (TYLENOL) tablet 650 mg, 650 mg, Oral, Q6H PRN, 650 mg at 07/11/23 0936 **OR** acetaminophen (TYLENOL) suppository 650 mg, 650 mg, Rectal, Q6H PRN, Jerald Kief, MD   amLODipine (NORVASC) tablet 5 mg, 5 mg, Oral, Daily, Jerald Kief, MD, 5 mg at 07/11/23 0941   atorvastatin (LIPITOR) tablet 10 mg,  10 mg, Oral, Daily, Jerald Kief, MD, 10 mg at 07/11/23 0941   buPROPion (WELLBUTRIN XL) 24 hr tablet 150 mg, 150 mg, Oral, q morning, Jerald Kief, MD, 150 mg at 07/11/23 0946   enoxaparin (LOVENOX) injection 60 mg, 60 mg, Subcutaneous, Q24H, Reome, Earle J, RPH, 60 mg at 07/11/23 0936   losartan (COZAAR) tablet 100 mg, 100 mg, Oral, Daily, Jerald Kief, MD, 100 mg at 07/11/23 0941   methylPREDNISolone sodium succinate (SOLU-MEDROL) 1,000 mg in sodium chloride 0.9 % 50 mL IVPB, 1,000 mg, Intravenous, Daily, de Saintclair Halsted, Cortney E, NP, Last Rate: 66 mL/hr at 07/11/23 1051, 1,000 mg at 07/11/23 1051   pantoprazole (PROTONIX) EC tablet 40 mg, 40 mg, Oral, Daily, de Saintclair Halsted, Lind[place]arne E, NP, 40 mg at 07/11/23 0936 Labs and Diagnostic Imaging   CBC:  Recent Labs  Lab 07/10/23 1736 07/11/23 0355  WBC 4.8 5.5  HGB 13.5 13.3  HCT 40.0 38.1  MCV 87.9 88.0  PLT 248 251    Basic Metabolic Panel:  Lab Results  Component Value Date   NA 134 (L) 07/11/2023   K 3.8 07/11/2023   CO2 21 (L) 07/11/2023   GLUCOSE 161 (H) 07/11/2023   BUN 16 07/11/2023   CREATININE 0.79 07/11/2023   CALCIUM 9.3 07/11/2023   GFRNONAA >60 07/11/2023   GFRAA 99 04/24/2020   Lipid Panel: No results found for: "LDLCALC" HgbA1c: No results found  for: "HGBA1C" Urine Drug Screen: No results found for: "LABOPIA", "COCAINSCRNUR", "LABBENZ", "AMPHETMU", "THCU", "LABBARB"  Alcohol Level No results found for: "ETH" INR  Lab Results  Component Value Date   INR 1.08 06/15/2014   APTT  Lab Results  Component Value Date   APTT 36 06/15/2014   AED levels: No results found for: "PHENYTOIN", "ZONISAMIDE", "LAMOTRIGINE", "LEVETIRACETA"  MRI Brain and cervical spine (Personally reviewed): Multiple scattered periventricular, periatrial, juxtacortical and subtle brainstem cerebellar and white matter hyperintensities compatible with chronic demyelinating disease with solid enhancing lesion in the left periventricular  region which appears new compared to previous MRI, new enhancing ventral spinal cord lesion at C7 which may represent active demyelinating plaque  Assessment   [name] is a 62 y.o. female with hx of MS on ocrevius presents with weeks of BL lower ext weakness. with the noted enhancing lesion in her brain and C spine.  Will do 2 days of steroids IV here but BL hip flexion weakness has pretty much resolved on exam today. Will do 3 days of PO steroids.  Recommendations  - about to get second day of IV solumedrol 1000mg  today. - recommend 3 days of PO 1250mg  Prednisone daily starting tomorrow. Stop after the 3rd day, no need for tapering. Send the script to [facility] pharmacy. - PPI while on steroids. - follow up with Dr. Despina Arias outpatient. - I advised her to cancel her trip to [place] that she had planned. She will be immunosuppressed due to steroids and high risk for infection. - we will signoff. Please feel free to contact us with any questions or concerns. - would benefit from outpatient repeat sleep study, [facility] should be able to do it. She reports excessive daytime sleepiness, excessive snoring reported by her wife. ______________________________________________________________________  Plan discussed with patient and with Dr. Rhona Leavens in person.  Signed, Erick Blinks, MD [facility]

## 2023-07-11 NOTE — Evaluation (Signed)
Occupational Therapy Evaluation and DC Summary   Patient Details Name: [name] MRN: [id number] DOB: Jul 06, 1961 Today's Date: 07/11/2023   History of Present Illness 62 y.o. female, who was referred to the ED for further management of MS. She reports that over the past 2 months she has had episodes of dizziness and unsteady ambulation as well as subjective weakness on the left side.  She contacted her outpatient neurologist and had a MRI performed which demonstrated new lesions in the brain and cervical spine. Past medical history of Asthma, Back pain, Cardiac/pericardial tamponade (06/11/2014), Depression, Hypertension, MS (multiple sclerosis), Muscle pain, Obesity, Periodic limb movement disorder (PLMD) (07/15/2016), Pleural effusion, bilateral (06/30/2014), and Uveitis.   Clinical Impression   Pt admitted for above, currently completing ADLs and mobility in halls independently. Pt close to baseline, discussed ergonomic strategies for work and nerve glides/stretches prn to reduce tingling in hands. She does note falls when taking the stairs, discussed this with PT. Pt has no further acute skilled OT needs, declined need for MS resources at this time but instructed her that there are resources available in the area and online if desired. No post acute OT follow-up needed.       If plan is discharge home, recommend the following: Other (comment) (n/a)    Functional Status Assessment  Patient has not had a recent decline in their functional status  Equipment Recommendations  None recommended by OT    Recommendations for Other Services       Precautions / Restrictions Precautions Precautions: Fall Restrictions Weight Bearing Restrictions: No      Mobility Bed Mobility               General bed mobility comments: up in chair on arrival    Transfers Overall transfer level: Independent Equipment used: None                      Balance Overall balance assessment:  Mild deficits observed, not formally tested                                         ADL either performed or assessed with clinical judgement   ADL Overall ADL's : Independent                                       General ADL Comments: Educated pt on nerve glides for occasional tingling in hands, discussed ergonomic body positioning while working at her desk and using an alt ergonomic mouse to reduce thumb pain     Vision Baseline Vision/History: 1 Wears glasses Patient Visual Report: No change from baseline Vision Assessment?: No apparent visual deficits Additional Comments: No signs of dysmetria     Perception         Praxis         Pertinent Vitals/Pain Pain Assessment Pain Assessment: No/denies pain     Extremity/Trunk Assessment Upper Extremity Assessment Upper Extremity Assessment: Overall WFL for tasks assessed (pt notes some tingling in bilat hands.)   Lower Extremity Assessment Lower Extremity Assessment: Defer to PT evaluation   Cervical / Trunk Assessment Cervical / Trunk Assessment: Normal   Communication Communication Communication: No apparent difficulties   Cognition Arousal: Alert Behavior During Therapy: WFL for tasks assessed/performed Overall Cognitive Status: Within Functional Limits  for tasks assessed                                       General Comments  VSS    Exercises     Shoulder Instructions      Home Living Family/patient expects to be discharged to:: Private residence Living Arrangements: Spouse/significant other (with wife) Available Help at Discharge: Family;Available PRN/intermittently Type of Home: House       Home Layout: Two level (split level) Alternate Level Stairs-Number of Steps: 8 up, 8 down Alternate Level Stairs-Rails: Right Bathroom Shower/Tub: Tub/shower unit (princess tub)   Bathroom Toilet:  (comfort height)     Home Equipment: Grab bars -  tub/shower;Rolling Walker (2 wheels);Cane - single point   Additional Comments: Takes outpt PT. 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 Pt notes a couple falls going up/down stairs      Prior Functioning/Environment Prior Level of Function : Independent/Modified Independent;Working/employed;History of Falls (last six months)             Mobility Comments: ind ADLs Comments: ind        OT Problem List: Impaired balance (sitting and/or standing)      OT Treatment/Interventions:      OT Goals(Current goals can be found in the care plan section) Acute Rehab OT Goals OT Goal Formulation: All assessment and education complete, DC therapy Time For Goal Achievement: 07/25/23 Potential to Achieve Goals: Good  OT Frequency:      Co-evaluation              AM-PAC OT "6 Clicks" Daily Activity     Outcome Measure Help from another person eating meals?: None Help from another person taking care of personal grooming?: None Help from another person toileting, which includes using toliet, bedpan, or urinal?: None Help from another person bathing (including washing, rinsing, drying)?: None Help from another person to put on and taking off regular upper body clothing?: None Help from another person to put on and taking off regular lower body clothing?: None 6 Click Score: 24   End of Session Nurse Communication: Mobility status  Activity Tolerance: Patient tolerated treatment well Patient left: Other (comment) (Pt left in care of PT to practice stairs in hall)  OT Visit Diagnosis: Unsteadiness on feet (R26.81)                Time: 8416-6063 OT Time Calculation (min): 13 min Charges:  OT General Charges $OT Visit: 1 Visit OT Evaluation $OT Eval Low Complexity: 1 Low  07/11/2023  AB, OTR/L  Acute Rehabilitation Services  Office: [phone]   Tristan Schroeder 07/11/2023, 11:10 AM

## 2023-07-11 NOTE — Evaluation (Signed)
Physical Therapy Evaluation and Discharge  Patient Details Name: [name] MRN: [id number] DOB: Jul 30, 1961 Today's Date: 07/11/2023  History of Present Illness  Pt is a 62 y/o female who presents 07/10/2023 with fatigue and LLE weakness progressing over 2 months. Pt underwent MRI at outpatient neurology visit which revealed new lesions in brain and at C7. PMH significant for MS, HTN, obesity.   Clinical Impression  Patient evaluated by Physical Therapy with no further acute PT needs identified. All education has been completed and the patient has no further questions. At the time of PT eval pt was able to perform transfers and ambulation with gross independence and no AD. Although pt has had a gradual decline over the past 2 months she is at her current functional baseline and would benefit from resuming outpatient PT services at d/c. See below for any follow-up Physical Therapy or equipment needs. PT is signing off, if needs change please reconsult.Thank you for this referral.         If plan is discharge home, recommend the following:     Can travel by private vehicle        Equipment Recommendations None recommended by PT  Recommendations for Other Services       Functional Status Assessment Patient has not had a recent decline in their functional status     Precautions / Restrictions Precautions Precautions: Fall Restrictions Weight Bearing Restrictions: No      Mobility  Bed Mobility               General bed mobility comments: up in chair on arrival    Transfers Overall transfer level: Independent Equipment used: None                    Ambulation/Gait Ambulation/Gait assistance: Independent Gait Distance (Feet): 200 Feet Assistive device: None Gait Pattern/deviations: WFL(Within Functional Limits) Gait velocity: Decreased Gait velocity interpretation: 1.31 - 2.62 ft/sec, indicative of limited community ambulator   General Gait Details: Slow  but generally steady without AD or UE support. No LOB noted.  Stairs Stairs: Yes Stairs assistance: Supervision Stair Management: One rail Right, Alternating pattern, Step to pattern, Forwards, Sideways Number of Stairs: 3 (x2) General stair comments: Initially forward facing, and then attempted sideways so B hands could be on railing for support. Pt reports feeling more comfortable facing sideways.  Wheelchair Mobility     Tilt Bed    Modified Rankin (Stroke Patients Only)       Balance Overall balance assessment: Mild deficits observed, not formally tested                                           Pertinent Vitals/Pain Pain Assessment Pain Assessment: Faces Faces Pain Scale: Hurts even more Pain Location: B LE's Pain Descriptors / Indicators: Discomfort, Restless Pain Intervention(s): Limited activity within patient's tolerance, Monitored during session, Repositioned, Ice applied    Home Living Family/patient expects to be discharged to:: Private residence Living Arrangements: Spouse/significant other (with wife) Available Help at Discharge: Family;Available PRN/intermittently Type of Home: House       Alternate Level Stairs-Number of Steps: 8 up, 8 down Home Layout: Two level (split level) Home Equipment: Grab bars - tub/shower;Rolling Walker (2 wheels);Cane - single point Additional Comments: 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 Reports she has not had her usual medications to control her leg pain)    Cervical / Trunk Assessment Cervical / Trunk Assessment: Normal  Communication   Communication Communication: No apparent difficulties  Cognition Arousal: Alert Behavior During Therapy: WFL for tasks assessed/performed Overall Cognitive Status: Within Functional Limits for tasks assessed                                          General Comments General comments (skin integrity, edema, etc.): VSS    Exercises     Assessment/Plan    PT Assessment Patient does not need any further PT services  PT Problem List         PT Treatment Interventions      PT Goals (Current goals can be found in the Care Plan section)  Acute Rehab PT Goals Patient Stated Goal: Improve her function PT Goal Formulation: All assessment and education complete, DC therapy    Frequency       Co-evaluation               AM-PAC PT "6 Clicks" Mobility  Outcome Measure Help needed turning from your back to your side while in a flat bed without using bedrails?: None Help needed moving from lying on your back to sitting on the side of a flat bed without using bedrails?: None Help needed moving to and from a bed to a chair (including a wheelchair)?: None Help needed standing up from a chair using your arms (e.g., wheelchair or bedside chair)?: None Help needed to walk in hospital room?: None Help needed climbing 3-5 steps with a railing? : A Little 6 Click Score: 23    End of Session Equipment Utilized During Treatment: Gait belt Activity Tolerance: Patient tolerated treatment well Patient left: in chair;with call bell/phone within reach;with family/visitor present Nurse Communication: Mobility status PT Visit Diagnosis: Difficulty in walking, not elsewhere classified (R26.2);Other symptoms and signs involving the nervous system (R29.898)    Time: 1610-9604 PT Time Calculation (min) (ACUTE  ONLY): 14 min   Charges:   PT Evaluation $PT Eval Low Complexity: 1 Low   PT General Charges $$ ACUTE PT VISIT: 1 Visit         Conni Slipper, PT, DPT Acute Rehabilitation Services Secure Chat Preferred Office: [phone]   Marylynn Pearson 07/11/2023, 1:03 PM

## 2023-07-11 NOTE — Progress Notes (Signed)
Patient discharged.  Removed PIV.  Reviewed discharge instructions, medications and follow up appts with patient and friend.  Answered questions.  Gave patient copy of discharge instructions.  TOC pharmacy filled prescription and delivered to floor.  Gave patient prescription.  No additional questions or concerns at this time.

## 2023-07-11 NOTE — Discharge Instructions (Signed)
Neurologist had advised her to cancel her trip to [place] that she had planned. She will be immunosuppressed due to steroids and high risk for infection.

## 2023-07-11 NOTE — Hospital Course (Signed)
62 y.o. female with medical history significant of multiple sclerosis, HTN, depression, obesity who presented with new MS lesions. Patient reports noting fatigue and LLE weakness that started about 2 months prior to admit, resulting in instability.  Pt also noted lightheadedness. Symptoms were noted to be progressive, prompting Neurology visit, where pt underwent MRI brain and cervical spine with findings concerning for new lesions in brain and at C7. Pt was referred to ED. In ED, pt did receive 1g solumedrol. Neurology was called and pt was accepted in direct admit for further management of MS.

## 2023-07-12 ENCOUNTER — Telehealth: Payer: Self-pay | Admitting: Neurology

## 2023-07-12 NOTE — Telephone Encounter (Signed)
Patient paged the on-call on Friday, November 29 stating that she was having an MS flare.  I called her back and fortunately could not reach her.  I asked the phone call service to reach her and states that if she feels she is having an MS flare she needs to go to the emergency room I could not reach her by phone, the emergency room needs to evaluate her for treatment or other infections for causes of any new symptoms or worsening symptoms.

## 2023-07-13 ENCOUNTER — Telehealth: Payer: Self-pay | Admitting: Adult Health

## 2023-07-13 NOTE — Telephone Encounter (Addendum)
Megan I'm sorry I failed to share this with you earlier because a call came to [place] from Dr Epimenio Foot (pt had paged on-call service over the weekend after she saw the results). She is going to see him on Monday. She is already on steroids (see other phone note & hospital visit from 11/29).

## 2023-07-13 NOTE — Telephone Encounter (Signed)
I called the patient.  She did not answer I left a voicemail.  Her MRI of the brain and cervical spine showed a new lesion.  I discussed with Dr. Epimenio Foot who recommended 3 days of high-dose steroids (this week) and then changing the patient over to [facility].  POD 4: if patient calls back please discuss with her. I can also speak to her if I am available.    MRI brain:  IMPRESSION: MRI scan of the brain with and without contrast showing multiple scattered periventricular, periatrial, juxtacortical and subtle brainstem cerebellar and white matter hyperintensities compatible with chronic demyelinating disease.  Solitary enhancing lesion is noted in the left periventricular region which appears new compared to previous MRI from 11/19/2021 and represents active disease.   MRI Cervical spine:  IMPRESSION: MRI scan cervical spine with and without contrast showing stable postoperative changes of anterior cervical fusion from C4-C7.  Ill-defined spinal cord hyperintensities are noted at T2 hyperintense foci within the spinal cord adjacent to C1-C2, C2, C2-C3 and C3-C4 which likely represent chronic demyelinating plaques.  There is a new enhancing ventral spinal cord lesion at C7 which likely represent active demyelinating plaque.

## 2023-07-13 NOTE — Telephone Encounter (Signed)
Pt last saw MM,NP 06/08/23

## 2023-07-13 NOTE — Telephone Encounter (Addendum)
I spoke with the patient.  She states her call was precipitated by the MRI results showing 2 new lesions.  She was having new symptoms back in October when she saw [facility] NP.  She states she went to the emergency department and they admitted her overnight and gave her 2 days of high-dose IV steroids and then sent her home on 3 days of 1250 mg/day.  Tomorrow is her last day on the steroid.  She states it is too soon to tell if there is improvement in her symptoms.  She is on Ocrevus.  Spoke with Dr Epimenio Foot and then scheduled pt for an appt with him on 07/20/23 at 8:30 AM arrival 15-30 minutes early. Pt verbalized appreciation for the call.

## 2023-07-14 ENCOUNTER — Telehealth: Payer: Self-pay | Admitting: Adult Health

## 2023-07-14 NOTE — Telephone Encounter (Signed)
I called the patient.  She did see her results over the weekend and spoke to the after-hours physician.  She went to the emergency room and got 2 days worth of steroids and then was sent home with oral steroids.  She has an appointment next Monday with Dr. Epimenio Foot to discuss changing therapy.

## 2023-07-16 NOTE — Telephone Encounter (Signed)
error 

## 2023-07-19 ENCOUNTER — Other Ambulatory Visit: Payer: Self-pay | Admitting: Adult Health

## 2023-07-20 ENCOUNTER — Encounter: Payer: Self-pay | Admitting: Neurology

## 2023-07-20 ENCOUNTER — Ambulatory Visit: Payer: BC Managed Care – PPO | Admitting: Neurology

## 2023-07-20 VITALS — BP 140/76 | HR 62 | Ht 69.0 in | Wt 277.0 lb

## 2023-07-20 DIAGNOSIS — Z79899 Other long term (current) drug therapy: Secondary | ICD-10-CM

## 2023-07-20 DIAGNOSIS — G35 Multiple sclerosis: Secondary | ICD-10-CM

## 2023-07-20 DIAGNOSIS — R269 Unspecified abnormalities of gait and mobility: Secondary | ICD-10-CM | POA: Diagnosis not present

## 2023-07-20 DIAGNOSIS — G4733 Obstructive sleep apnea (adult) (pediatric): Secondary | ICD-10-CM

## 2023-07-20 NOTE — Progress Notes (Signed)
[facility]  PATIENT: [name] DOB: Oct 24, 1960  REFERRING DOCTOR OR PCP:  Dr. Chales Salmon SOURCE: Patient, note from Dr. Anne Hahn and [name], NP, laboratory and imaging reports, MRI images personally reviewed.  _________________________________   HISTORICAL  CHIEF COMPLAINT:  Chief Complaint  Patient presents with   Follow-up    Pt in room 10. Fannie Knee (wife in room) MS exacerbation was seen at ER on 07/10/23 was given prednisone. Patient here for hospital follow up.     HISTORY OF PRESENT ILLNESS:  [name] is a 62 y.o. woman with multiple sclerosis.  Update 07/20/2023 She was diagnosed with MS in 2000.  She is on Ocrevus last infusion was 06/2023.  The previous one in May 2024.    She had increased symptoms the lat 1-2 months With more dizziness.   Fatigue and focus worsened.   An MRI of the brian and cervical spine showed a new enhancing lesion in the brain and new enhancing lesion in the spinal cord.   Her previous exacerbation was in 2014 before the switch to Tysabri.  However, over the last year, she notes she leans more to the left or it is mildly weaker.    She is always tired.   She has OSA but had trouble tolerating CPAP and has stopped.   She wakes up a lot at night.    Armodafinil helps sleepiness.  She is walking without an aide. Stairs are difficult.  Muscles tire out easily.   She feels she could walk a half mile without a break but was better last year.   Her left side is weaker than her right and she has some left leg spasticity.   She has urinary urgency and rare urge incontinence.   She has not been placed on any medication for this.    She has had chronic left  uveitis.      She sleeps poorly and notes restless legs and just general restlessness.   She is taking Mirapex XR once a day and additional dose at bedtime.  She still wakes up with the restless sensation but since the nighttime dose was increased, she has done better.    She  still notes left leg spasticity.       She had cervical ACDF 12/2019 C5-C7.     She continues to have left neck pain and pain down the left arm.   Pain will go down to the elbow but not to the hand.     MS History: She was diagnosed in 2000 after presenting with unilateral numbness.   She ws started on Avonex. .   In retrospect, she also had left optic neuritis in 1996.     She switched from Avonex to Rebif due to breakthrough activity.   She was switched to Tysabri due to breakthrough activity.   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      She had a relapse with 2 ehancing lesions on MRI in Novemebr 2024 and will be switched to [facility]  Imaging: MRI Brain 05/09/2020 shows   Multiple T2/FLAIR hyperintense foci in the cerebellum, brainstem, thalamus and hemispheres in a pattern and configuration consistent with chronic demyelinating plaque associated with multiple sclerosis. None of the foci appears to be acute.  No changes compared to 06/28/2019  MRI cervical spine 05/09/2020 shows    There are T2 hyperintense foci within the posterior spinal cord adjacent to C2 , anteriorly adjacent to C3, posteriorly adjacent  to C3-C4 and anteriorly to the left adjacent to C4-C5.and multilevel DJD with mild spinal stenosis at C4C5 and C6C6 and significant foraminal narrowing at C3C4 and C5C6.     Potential for right C4, right C5, bilateral C6 and left C7 nerve root compression.    MRI of the brain 11/19/2021 shows T2/FLAIR hyperintense foci in the cerebellum, middle cerebellar peduncle, thalamus, upper spinal cord and in the cerebral hemispheres in a pattern and configuration consistent with chronic demyelinating plaque associated with multiple sclerosis. None of the foci appear to be acute. Compared to the MRI from 05/09/2020, there were no new lesions.   MRI of the cervical spine 11/19/2021 shows multiple T2 hyperintense foci within the spinal cord. These are located adjacent to C1-C2, adjacent to  C2, anteriorly adjacent to C2-C3/anterocentrally adjacent to C3, posterolaterally to the right adjacent to C3-C4. Compared to the MRI dated 05/09/2020, there did not appear to be any new lesions. .   C4-C7 ACDF.  There is residual mild spinal stenosis at C6-C7 though there does not appear to be nerve root compression at any of these levels.  .   Mild degenerative changes at C2-C3 and C3-C4 that do not lead to any nerve root compression or spinal stenosis.  MRI of the brain 07/08/2023 showed an enhancing lesion in the periventricular white matter of the left frontal lobe.  This lesion was not present on the previous MRI from 2023.  Other lesions appeared unchanged.  MRI of the cervical spine 07/08/2023 showed a small new enhancing ventral spinal cord lesion at C7 not present on the previous MRI.  She has additional foci in the spinal cord unchanged compared to the previous MRI.  REVIEW OF SYSTEMS: Constitutional: No fevers, chills, sweats, or change in appetite Eyes: No visual changes, double vision, eye pain Ear, nose and throat: No hearing loss, ear pain, nasal congestion, sore throat Cardiovascular: No chest pain, palpitations Respiratory:  No shortness of breath at rest or with exertion.   No wheezes GastrointestinaI: No nausea, vomiting, diarrhea, abdominal pain, fecal incontinence Genitourinary:  No dysuria, urinary retention or frequency.  No nocturia. Musculoskeletal:  No neck pain, back pain Integumentary: No rash, pruritus, skin lesions Neurological: as above Psychiatric: No depression at this time.  No anxiety Endocrine: No palpitations, diaphoresis, change in appetite, change in weigh or increased thirst Hematologic/Lymphatic:  No anemia, purpura, petechiae. Allergic/Immunologic: No itchy/runny eyes, nasal congestion, recent allergic reactions, rashes  ALLERGIES: Allergies  Allergen Reactions   Penicillins Rash    HOME MEDICATIONS:  Current Outpatient Medications:     amLODipine (NORVASC) 5 MG tablet, Take 5 mg by mouth daily., Disp: , Rfl:    Armodafinil 150 MG tablet, 1/2 tablet to 1 tablet po qAM as needed (Patient taking differently: Take 0.5 tablets by mouth daily as needed (daytime sleepiness).), Disp: 90 tablet, Rfl: 1   atorvastatin (LIPITOR) 10 MG tablet, Take 10 mg by mouth daily., Disp: , Rfl:    buPROPion (WELLBUTRIN XL) 150 MG 24 hr tablet, Take 150 mg by mouth every morning., Disp: , Rfl:    famotidine (PEPCID) 20 MG tablet, Take 1 tablet (20 mg total) by mouth 2 (two) times daily., Disp: 30 tablet, Rfl: 3   fluticasone (FLONASE) 50 MCG/ACT nasal spray, Place 2 sprays into both nostrils 2 (two) times daily., Disp: 16 g, Rfl: 6   gabapentin (NEURONTIN) 100 MG capsule, Take 100 mg by mouth., Disp: , Rfl:    ibuprofen (ADVIL,MOTRIN) 200 MG tablet, Take 400 mg  by mouth every 6 (six) hours as needed., Disp: , Rfl:    losartan (COZAAR) 100 MG tablet, Take 100 mg by mouth daily., Disp: , Rfl:    meloxicam (MOBIC) 15 MG tablet, Take 1 tablet (15 mg total) by mouth daily., Disp: 30 tablet, Rfl: 3   Ocrelizumab (OCREVUS IV), Inject into the vein every 6 (six) months. 1st dose 11-2020, Disp: , Rfl:    pantoprazole (PROTONIX) 40 MG tablet, Take 1 tablet (40 mg total) by mouth 2 (two) times daily., Disp: 60 tablet, Rfl: 0   pramipexole (MIRAPEX) 0.25 MG tablet, TAKE 1 TABLET BY MOUTH 2 TIMES DAILY AS NEEDED. (Patient taking differently: Take 2 tablets by mouth at bedtime.), Disp: 180 tablet, Rfl: 2   Pramipexole Dihydrochloride 0.375 MG TB24, TAKE 1 TABLET (0.375 MG TOTAL) BY MOUTH AT BEDTIME., Disp: 90 tablet, Rfl: 3   prednisoLONE acetate (PRED FORTE) 1 % ophthalmic suspension, As needed, Disp: , Rfl:    baclofen (LIORESAL) 10 MG tablet, TAKE 0.5 TABLETS (5 MG TOTAL) BY MOUTH AT BEDTIME AS NEEDED FOR MUSCLE SPASMS., Disp: 15 tablet, Rfl: 1  PAST MEDICAL HISTORY: Past Medical History:  Diagnosis Date   Asthma    Back pain    Cardiac/pericardial tamponade  06/11/2014   s/p pericardial window 06/16/2014 per Dr. Maren Beach   Depression    Hypertension    MS (multiple sclerosis) (HCC)    Muscle pain    Obesity    Periodic limb movement disorder (PLMD) 07/15/2016   Pleural effusion, bilateral 06/30/2014   Uveitis    Bilateral    PAST SURGICAL HISTORY: Past Surgical History:  Procedure Laterality Date   CERVICAL FUSION     01-02-21, [place] C5-7   microdisectomy  2019   lumbar spine   PERICARDIAL TAP N/A 06/11/2014   Procedure: PERICARDIAL TAP;  Surgeon: Micheline Chapman, MD;  Location: [facility] CATH LAB;  Service: Cardiovascular;  Laterality: N/A;   SUBXYPHOID PERICARDIAL WINDOW N/A 06/16/2014   Procedure: SUBXYPHOID PERICARDIAL WINDOW;  Surgeon: Kerin Perna, MD;  Location: [facility] OR;  Service: Thoracic;  Laterality: N/A;   TEE WITHOUT CARDIOVERSION N/A 06/16/2014   Procedure: TRANSESOPHAGEAL ECHOCARDIOGRAM (TEE);  Surgeon: Kerin Perna, MD;  Location: [facility] OR;  Service: Thoracic;  Laterality: N/A;   uterine ablasion  2012    FAMILY HISTORY: Family History  Problem Relation Age of Onset   Breast cancer Mother    Hypertension Mother    Diabetes Mother    Heart attack Father    Epilepsy Father    Endocrine tumor Brother    Multiple sclerosis Neg Hx     SOCIAL HISTORY:  Social History   Socioeconomic History   Marital status: Married    Spouse name: Not on file   Number of children: 0   Years of education: MA   Highest education level: Not on file  Occupational History   Occupation: [employer]  Tobacco Use   Smoking status: Never   Smokeless tobacco: Never  Vaping Use   Vaping status: Some Days  Substance and Sexual Activity   Alcohol use: Yes    Alcohol/week: 3.0 standard drinks of alcohol    Types: 3 Standard drinks or equivalent per week    Comment: occasional   Drug use: Yes    Comment: marijuana   Sexual activity: Not Currently  Other Topics Concern   Not on file  Social History Narrative    Patient lives at home with her wife (  Fannie Knee)    Patient works full time.   Financial controller.   Right handed.   Caffeine two cups daily.   Social Determinants of Health   Financial Resource Strain: Not on file  Food Insecurity: No Food Insecurity (07/10/2023)   Hunger Vital Sign    Worried About Running Out of Food in the Last Year: Never true    Ran Out of Food in the Last Year: Never true  Transportation Needs: No Transportation Needs (07/10/2023)   PRAPARE - Administrator, Civil Service (Medical): No    Lack of Transportation (Non-Medical): No  Physical Activity: Not on file  Stress: Not on file  Social Connections: Not on file  Intimate Partner Violence: Not At Risk (07/10/2023)   Humiliation, Afraid, Rape, and Kick questionnaire    Fear of Current or Ex-Partner: No    Emotionally Abused: No    Physically Abused: No    Sexually Abused: No     PHYSICAL EXAM  Vitals:   07/20/23 0829  BP: (!) 140/76  Pulse: 62  Weight: 277 lb (125.6 kg)  Height: 5\' 9"  (1.753 m)    Body mass index is 40.91 kg/m.   General: The patient is well-developed and well-nourished and in no acute distress  HEENT:  Head is [place]/AT.  Sclera are anicteric.    Mallampati 2.   16 inch neck circumference  Neck: No carotid bruits are noted.  The neck is nontender.  Cardiovascular: The heart has a regular rate and rhythm with a normal S1 and S2. There were no murmurs, gallops or rubs.    Skin: Extremities are without rash or  edema.  Musculoskeletal:  Back is nontender  Neurologic Exam  Mental status: The patient is alert and oriented x 3 at the time of the examination. The patient has apparent normal recent and remote memory, with an apparently normal attention span and concentration ability.   Speech is normal.  Cranial nerves: Extraocular movements are full. Pupils are equal, round, and reactive to light and accomodation.  Facial strength and sensation was normal.  Trapezius  strength was normal.  The tongue is midline, and the patient has symmetric elevation of the soft palate. No obvious hearing deficits are noted.  Motor:  Muscle bulk is normal.   Tone is normal. Strength is  5 / 5 in all 4 extremities.   Sensory: Sensory testing is intact to pinprick, soft touch and vibration sensation in all 4 extremities.  Coordination: Cerebellar testing reveals good finger-nose-finger and heel-to-shin bilaterally.  Gait and station: Station is normal.  The gait is mildly  wide.  Tandem gait is wide.  Romberg is negative.  Reflexes: Deep tendon reflexes are symmetric and normal in the arms and increase in the legs with spreasd at knees.  However, there is no ankle clonus.Marland Kitchen       DIAGNOSTIC DATA (LABS, IMAGING, TESTING) - I reviewed patient records, labs, notes, testing and imaging myself where available.  Lab Results  Component Value Date   WBC 5.5 07/11/2023   HGB 13.3 07/11/2023   HCT 38.1 07/11/2023   MCV 88.0 07/11/2023   PLT 251 07/11/2023      Component Value Date/Time   NA 134 (L) 07/11/2023 0355   NA 136 06/08/2023 1113   K 3.8 07/11/2023 0355   CL 103 07/11/2023 0355   CO2 21 (L) 07/11/2023 0355   GLUCOSE 161 (H) 07/11/2023 0355   BUN 16 07/11/2023 0355   BUN 15 06/08/2023  1113   CREATININE 0.79 07/11/2023 0355   CREATININE 0.76 06/27/2014 0915   CALCIUM 9.3 07/11/2023 0355   PROT 7.2 07/11/2023 0355   PROT 6.7 06/08/2023 1113   ALBUMIN 3.9 07/11/2023 0355   ALBUMIN 4.0 06/08/2023 1113   AST 17 07/11/2023 0355   ALT 17 07/11/2023 0355   ALKPHOS 90 07/11/2023 0355   BILITOT 0.5 07/11/2023 0355   BILITOT 0.3 06/08/2023 1113   GFRNONAA >60 07/11/2023 0355   GFRAA 99 04/24/2020 0915    Lab Results  Component Value Date   VITAMINB12 479 04/24/2020   Lab Results  Component Value Date   TSH 1.300 04/24/2020       ASSESSMENT AND PLAN  Multiple sclerosis (HCC) - Plan: Ambulatory referral to Physical Therapy, CBC with  Differential/Platelet, Hepatitis B surface antigen, Hepatitis B Core AB, Total, QuantiFERON-TB Gold Plus  High risk medication use - Plan: CBC with Differential/Platelet, Hepatitis B surface antigen, Hepatitis B Core AB, Total, QuantiFERON-TB Gold Plus  OSA (obstructive sleep apnea) - Plan: Home sleep test  Gait disorder - Plan: Ambulatory referral to Physical Therapy   Due to exacerbation will switch from Ocrevus to [facility].   We discussed the S1P modulators but she has uveitis which would increase risk of macular edema.   Check labs as needed.  She was JCV Ab positive so Tysabri is not a great option.  Due to exacerbation, she needs a stronger DMT and DMF, teriflunomide would not be good options.   We had a long discussion about natural history of MS.  She has several plaques in the spinal cord.  We discussed that some progression with age is likely, especially with existing issues such as balance and gait.  Hopefully the secondary progressive changes will be minimal to mild.    \Referral to physical therapy. Stay active and exercise as tolerated.  Rtc 6 months  42-minute office visit with the majority of the time spent face-to-face for history and physical, discussion/counseling and decision-making.  Additional time with record review and documentation.   Chellsie Gomer A. Epimenio Foot, MD, [facility] 07/20/2023, 1:10 PM Certified in Neurology, Clinical Neurophysiology, Sleep Medicine and Neuroimaging  [facility] [address] [place], [place] [address] [phone]

## 2023-07-22 LAB — CBC WITH DIFFERENTIAL/PLATELET
Basophils Absolute: 0 10*3/uL (ref 0.0–0.2)
Basos: 0 %
EOS (ABSOLUTE): 0.1 10*3/uL (ref 0.0–0.4)
Eos: 2 %
Hematocrit: 41.4 % (ref 34.0–46.6)
Hemoglobin: 13.5 g/dL (ref 11.1–15.9)
Immature Grans (Abs): 0 10*3/uL (ref 0.0–0.1)
Immature Granulocytes: 0 %
Lymphocytes Absolute: 1.4 10*3/uL (ref 0.7–3.1)
Lymphs: 23 %
MCH: 30.1 pg (ref 26.6–33.0)
MCHC: 32.6 g/dL (ref 31.5–35.7)
MCV: 92 fL (ref 79–97)
Monocytes Absolute: 0.7 10*3/uL (ref 0.1–0.9)
Monocytes: 12 %
Neutrophils Absolute: 3.8 10*3/uL (ref 1.4–7.0)
Neutrophils: 63 %
Platelets: 224 10*3/uL (ref 150–450)
RBC: 4.49 x10E6/uL (ref 3.77–5.28)
RDW: 12.5 % (ref 11.7–15.4)
WBC: 6.1 10*3/uL (ref 3.4–10.8)

## 2023-07-22 LAB — QUANTIFERON-TB GOLD PLUS
QuantiFERON Mitogen Value: 7.78 [IU]/mL
QuantiFERON Nil Value: 0.13 [IU]/mL
QuantiFERON TB1 Ag Value: 0.53 [IU]/mL
QuantiFERON TB2 Ag Value: 0.38 [IU]/mL
QuantiFERON-TB Gold Plus: POSITIVE — AB

## 2023-07-22 LAB — HEPATITIS B CORE ANTIBODY, TOTAL: Hep B Core Total Ab: NEGATIVE

## 2023-07-22 LAB — HEPATITIS B SURFACE ANTIGEN: Hepatitis B Surface Ag: NEGATIVE

## 2023-07-23 ENCOUNTER — Other Ambulatory Visit: Payer: Self-pay | Admitting: Neurology

## 2023-07-23 DIAGNOSIS — R7612 Nonspecific reaction to cell mediated immunity measurement of gamma interferon antigen response without active tuberculosis: Secondary | ICD-10-CM

## 2023-07-27 ENCOUNTER — Telehealth: Payer: Self-pay | Admitting: Neurology

## 2023-07-27 ENCOUNTER — Telehealth: Payer: Self-pay | Admitting: *Deleted

## 2023-07-27 NOTE — Telephone Encounter (Signed)
Referral for Physical Therapy fax to [facility] [place] as requested. Phone: [phone], Fax: [phone].

## 2023-07-27 NOTE — Telephone Encounter (Signed)
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  Most of the time this is a false positive but we need to do the following: 1.  Recheck the TB test.  I will place an order and she can go to any [facility] orifice. 2.  Chest x-ray.  I will place the order.  Since she lives in [place] I will place it for the med center off of [address]

## 2023-07-27 NOTE — Telephone Encounter (Signed)
Left message for patient to call, I told her I would send my chart message as well. Message sent

## 2023-07-29 ENCOUNTER — Encounter: Payer: Self-pay | Admitting: Neurology

## 2023-07-29 NOTE — Telephone Encounter (Signed)
Are you okay with this? Rx is pending and set for her to take TID Follow up scheduled on 10/19/23

## 2023-07-30 ENCOUNTER — Ambulatory Visit: Payer: BC Managed Care – PPO | Admitting: Neurology

## 2023-07-30 DIAGNOSIS — G4733 Obstructive sleep apnea (adult) (pediatric): Secondary | ICD-10-CM | POA: Diagnosis not present

## 2023-07-30 MED ORDER — GABAPENTIN 100 MG PO CAPS: 100.0000 mg | ORAL_CAPSULE | Freq: Three times a day (TID) | ORAL | 11 refills | Status: DC

## 2023-07-31 DIAGNOSIS — Z9181 History of falling: Secondary | ICD-10-CM | POA: Diagnosis not present

## 2023-07-31 DIAGNOSIS — M6281 Muscle weakness (generalized): Secondary | ICD-10-CM | POA: Diagnosis not present

## 2023-07-31 DIAGNOSIS — G35 Multiple sclerosis: Secondary | ICD-10-CM | POA: Diagnosis not present

## 2023-07-31 DIAGNOSIS — H81392 Other peripheral vertigo, left ear: Secondary | ICD-10-CM | POA: Diagnosis not present

## 2023-08-04 DIAGNOSIS — Z9181 History of falling: Secondary | ICD-10-CM | POA: Diagnosis not present

## 2023-08-04 DIAGNOSIS — H81392 Other peripheral vertigo, left ear: Secondary | ICD-10-CM | POA: Diagnosis not present

## 2023-08-04 DIAGNOSIS — G35 Multiple sclerosis: Secondary | ICD-10-CM | POA: Diagnosis not present

## 2023-08-04 DIAGNOSIS — M6281 Muscle weakness (generalized): Secondary | ICD-10-CM | POA: Diagnosis not present

## 2023-08-14 ENCOUNTER — Other Ambulatory Visit: Payer: Self-pay | Admitting: Neurology

## 2023-08-14 DIAGNOSIS — G4733 Obstructive sleep apnea (adult) (pediatric): Secondary | ICD-10-CM

## 2023-08-17 ENCOUNTER — Telehealth: Payer: Self-pay | Admitting: *Deleted

## 2023-08-17 NOTE — Telephone Encounter (Signed)
 I called the patient and LVM (ok per DPR) advising her that Dr Vear reviewed her sleep study results which showed severe sleep apnea. Per Dr Vear, because she had difficulty with CPAP in the past, he recommends that she have an in-lab CPAP titration. If higher pressures are needed she can be switched to BiPAP during the titration. Dr Vear placed the order already. Our office will obtain the insurance authorization and then call her to schedule. I left our office number for call back if any questions. I also sent a mychart message to her.

## 2023-08-17 NOTE — Telephone Encounter (Signed)
-----   Message from Charlie DELENA Crete sent at 08/14/2023  2:33 PM EST ----- Regarding: Home sleep study Please let the patient know that the sleep study showed that she had severe sleep apnea.  Because she had difficulty with CPAP in the past, I recommend that she have an in-lab CPAP titration.  If higher pressures are needed she can be switched to BiPAP during the titration.  I went ahead and placed the order.

## 2023-08-18 DIAGNOSIS — Z9181 History of falling: Secondary | ICD-10-CM | POA: Diagnosis not present

## 2023-08-18 DIAGNOSIS — M6281 Muscle weakness (generalized): Secondary | ICD-10-CM | POA: Diagnosis not present

## 2023-08-18 DIAGNOSIS — G35 Multiple sclerosis: Secondary | ICD-10-CM | POA: Diagnosis not present

## 2023-08-18 DIAGNOSIS — H81392 Other peripheral vertigo, left ear: Secondary | ICD-10-CM | POA: Diagnosis not present

## 2023-08-19 ENCOUNTER — Other Ambulatory Visit: Payer: Self-pay | Admitting: Adult Health

## 2023-08-20 ENCOUNTER — Other Ambulatory Visit: Payer: Self-pay | Admitting: Neurology

## 2023-08-20 DIAGNOSIS — H81392 Other peripheral vertigo, left ear: Secondary | ICD-10-CM | POA: Diagnosis not present

## 2023-08-20 DIAGNOSIS — Z9181 History of falling: Secondary | ICD-10-CM | POA: Diagnosis not present

## 2023-08-20 DIAGNOSIS — G35 Multiple sclerosis: Secondary | ICD-10-CM | POA: Diagnosis not present

## 2023-08-20 DIAGNOSIS — M6281 Muscle weakness (generalized): Secondary | ICD-10-CM | POA: Diagnosis not present

## 2023-08-20 MED ORDER — PRAMIPEXOLE DIHYDROCHLORIDE 0.25 MG PO TABS: ORAL_TABLET | ORAL | 3 refills | Status: DC

## 2023-08-23 ENCOUNTER — Other Ambulatory Visit: Payer: Self-pay | Admitting: Internal Medicine

## 2023-08-24 ENCOUNTER — Other Ambulatory Visit: Payer: Self-pay | Admitting: Podiatry

## 2023-08-25 ENCOUNTER — Telehealth: Payer: Self-pay | Admitting: Neurology

## 2023-08-25 NOTE — Telephone Encounter (Signed)
 Sent mychart message to the patient- need new health insurance.

## 2023-08-26 DIAGNOSIS — Z9181 History of falling: Secondary | ICD-10-CM | POA: Diagnosis not present

## 2023-08-26 DIAGNOSIS — M6281 Muscle weakness (generalized): Secondary | ICD-10-CM | POA: Diagnosis not present

## 2023-08-26 DIAGNOSIS — N3091 Cystitis, unspecified with hematuria: Secondary | ICD-10-CM | POA: Diagnosis not present

## 2023-08-26 DIAGNOSIS — R319 Hematuria, unspecified: Secondary | ICD-10-CM | POA: Diagnosis not present

## 2023-08-26 DIAGNOSIS — H81392 Other peripheral vertigo, left ear: Secondary | ICD-10-CM | POA: Diagnosis not present

## 2023-08-26 DIAGNOSIS — R300 Dysuria: Secondary | ICD-10-CM | POA: Diagnosis not present

## 2023-08-26 DIAGNOSIS — G35 Multiple sclerosis: Secondary | ICD-10-CM | POA: Diagnosis not present

## 2023-08-26 NOTE — Telephone Encounter (Signed)
Dr. Sater- how do you want to proceed? 

## 2023-08-26 NOTE — Telephone Encounter (Signed)
 [employer] is not approving the Bipap sleep study they are wanting the patient to have APAP instead

## 2023-08-28 DIAGNOSIS — Z9181 History of falling: Secondary | ICD-10-CM | POA: Diagnosis not present

## 2023-08-28 DIAGNOSIS — M6281 Muscle weakness (generalized): Secondary | ICD-10-CM | POA: Diagnosis not present

## 2023-08-28 DIAGNOSIS — H81392 Other peripheral vertigo, left ear: Secondary | ICD-10-CM | POA: Diagnosis not present

## 2023-08-28 DIAGNOSIS — G35 Multiple sclerosis: Secondary | ICD-10-CM | POA: Diagnosis not present

## 2023-08-31 DIAGNOSIS — M6281 Muscle weakness (generalized): Secondary | ICD-10-CM | POA: Diagnosis not present

## 2023-08-31 DIAGNOSIS — G35 Multiple sclerosis: Secondary | ICD-10-CM | POA: Diagnosis not present

## 2023-08-31 DIAGNOSIS — Z9181 History of falling: Secondary | ICD-10-CM | POA: Diagnosis not present

## 2023-08-31 DIAGNOSIS — H81392 Other peripheral vertigo, left ear: Secondary | ICD-10-CM | POA: Diagnosis not present

## 2023-09-01 ENCOUNTER — Telehealth: Payer: Self-pay

## 2023-09-01 NOTE — Telephone Encounter (Signed)
Faxed off pt's Plan of Care to [facility] [phone] on 09/01/2023.

## 2023-09-02 DIAGNOSIS — G35 Multiple sclerosis: Secondary | ICD-10-CM | POA: Diagnosis not present

## 2023-09-02 DIAGNOSIS — Z9181 History of falling: Secondary | ICD-10-CM | POA: Diagnosis not present

## 2023-09-02 DIAGNOSIS — H81392 Other peripheral vertigo, left ear: Secondary | ICD-10-CM | POA: Diagnosis not present

## 2023-09-02 DIAGNOSIS — M6281 Muscle weakness (generalized): Secondary | ICD-10-CM | POA: Diagnosis not present

## 2023-09-03 NOTE — Telephone Encounter (Signed)
I called pt because I saw where repeat TB Test/chest xray has not been completed as ordered. She didn't realize she needed to complete. I did relay mychart message sent 07/27/23 about this. She apologized. She will plan to go early next week to get chest xray done at [facility] and will come by our office for TB test. Aware this is what we need to clear her to start Mavenclad. Once results back, we will be back in touch with her to go over.

## 2023-09-07 ENCOUNTER — Ambulatory Visit
Admission: RE | Admit: 2023-09-07 | Discharge: 2023-09-07 | Disposition: A | Discharge status: Home / Self Care | Payer: BC Managed Care – PPO | Source: Ambulatory Visit | Attending: Neurology | Admitting: Neurology

## 2023-09-07 ENCOUNTER — Other Ambulatory Visit (INDEPENDENT_AMBULATORY_CARE_PROVIDER_SITE_OTHER): Payer: Self-pay

## 2023-09-07 DIAGNOSIS — R059 Cough, unspecified: Secondary | ICD-10-CM | POA: Diagnosis not present

## 2023-09-07 DIAGNOSIS — G35 Multiple sclerosis: Secondary | ICD-10-CM | POA: Diagnosis not present

## 2023-09-07 DIAGNOSIS — Z9181 History of falling: Secondary | ICD-10-CM | POA: Diagnosis not present

## 2023-09-07 DIAGNOSIS — M6281 Muscle weakness (generalized): Secondary | ICD-10-CM | POA: Diagnosis not present

## 2023-09-07 DIAGNOSIS — R7611 Nonspecific reaction to tuberculin skin test without active tuberculosis: Secondary | ICD-10-CM | POA: Diagnosis not present

## 2023-09-07 DIAGNOSIS — Z0289 Encounter for other administrative examinations: Secondary | ICD-10-CM

## 2023-09-07 DIAGNOSIS — R7612 Nonspecific reaction to cell mediated immunity measurement of gamma interferon antigen response without active tuberculosis: Secondary | ICD-10-CM

## 2023-09-07 DIAGNOSIS — H81392 Other peripheral vertigo, left ear: Secondary | ICD-10-CM | POA: Diagnosis not present

## 2023-09-11 LAB — QUANTIFERON-TB GOLD PLUS
QuantiFERON Mitogen Value: 5.26 [IU]/mL
QuantiFERON Nil Value: 0.02 [IU]/mL
QuantiFERON TB1 Ag Value: 0.15 [IU]/mL
QuantiFERON TB2 Ag Value: 0.18 [IU]/mL
QuantiFERON-TB Gold Plus: NEGATIVE

## 2023-09-15 ENCOUNTER — Telehealth: Payer: Self-pay | Admitting: *Deleted

## 2023-09-15 NOTE — Telephone Encounter (Signed)
Faxed completed/signed [employer]avenclad start form to [employer] at [phone]. Received fax confirmation.

## 2023-09-16 NOTE — Telephone Encounter (Signed)
 Received PA request for Mavenclad. Key: [id number]

## 2023-09-16 NOTE — Telephone Encounter (Signed)
 Marland Kitchen

## 2023-09-16 NOTE — Telephone Encounter (Signed)
Completed PA for Mavenclad via CMM. Sent to [facility]. Key: [id number]. Should have a determination within 1-3 business days.

## 2023-09-16 NOTE — Telephone Encounter (Signed)
 PA was approved.

## 2023-09-17 NOTE — Telephone Encounter (Signed)
 Called pt and LVM for her about lab results. Relayed start form for [facility] sent in and it was approved via insurance. Asked her to be on look out for call from [employer].

## 2023-09-20 ENCOUNTER — Encounter: Payer: Self-pay | Admitting: Neurology

## 2023-09-22 ENCOUNTER — Other Ambulatory Visit: Payer: Self-pay

## 2023-09-22 ENCOUNTER — Telehealth: Payer: Self-pay

## 2023-09-22 DIAGNOSIS — G4733 Obstructive sleep apnea (adult) (pediatric): Secondary | ICD-10-CM

## 2023-09-22 NOTE — Telephone Encounter (Signed)
Placed AutoPAP order to [facility]

## 2023-09-22 NOTE — Telephone Encounter (Signed)
Called pt and let her know on 08/25/2023 her BiPAP Sleep Titration study was denied due to needing to try and fail the AutoPAP first. Pt was in agreement to try AutoPAP first and then go from there.   Sending in orders for AutoPAP to [facility].

## 2023-09-24 ENCOUNTER — Encounter: Payer: Self-pay | Admitting: Neurology

## 2023-09-29 NOTE — Telephone Encounter (Signed)
Called [employer] and spoke w/ Dashia at [phone]. They spoke w/ pt today who confirmed medication delivered 09/28/23.

## 2023-10-03 ENCOUNTER — Other Ambulatory Visit: Payer: Self-pay | Admitting: Adult Health

## 2023-10-16 DIAGNOSIS — G4733 Obstructive sleep apnea (adult) (pediatric): Secondary | ICD-10-CM | POA: Diagnosis not present

## 2023-10-19 ENCOUNTER — Ambulatory Visit: Payer: BC Managed Care – PPO | Admitting: Neurology

## 2023-11-01 ENCOUNTER — Telehealth: Payer: Self-pay | Admitting: Neurology

## 2023-11-01 NOTE — Telephone Encounter (Signed)
 Started Mavenclad 2/.17/2025

## 2023-11-09 ENCOUNTER — Encounter: Payer: Self-pay | Admitting: Neurology

## 2023-11-09 ENCOUNTER — Other Ambulatory Visit: Payer: Self-pay | Admitting: *Deleted

## 2023-11-09 MED ORDER — GABAPENTIN 300 MG PO CAPS: 300.0000 mg | ORAL_CAPSULE | Freq: Three times a day (TID) | ORAL | 2 refills | Status: DC

## 2023-11-16 DIAGNOSIS — G4733 Obstructive sleep apnea (adult) (pediatric): Secondary | ICD-10-CM | POA: Diagnosis not present

## 2023-11-19 DIAGNOSIS — M2550 Pain in unspecified joint: Secondary | ICD-10-CM | POA: Diagnosis not present

## 2023-11-19 DIAGNOSIS — M151 Heberden's nodes (with arthropathy): Secondary | ICD-10-CM | POA: Diagnosis not present

## 2023-11-19 DIAGNOSIS — E611 Iron deficiency: Secondary | ICD-10-CM | POA: Diagnosis not present

## 2023-11-19 DIAGNOSIS — F410 Panic disorder [episodic paroxysmal anxiety] without agoraphobia: Secondary | ICD-10-CM | POA: Diagnosis not present

## 2023-11-19 DIAGNOSIS — I10 Essential (primary) hypertension: Secondary | ICD-10-CM | POA: Diagnosis not present

## 2023-11-19 DIAGNOSIS — E569 Vitamin deficiency, unspecified: Secondary | ICD-10-CM | POA: Diagnosis not present

## 2023-11-29 ENCOUNTER — Other Ambulatory Visit: Payer: Self-pay | Admitting: Neurology

## 2023-11-30 ENCOUNTER — Encounter: Payer: Self-pay | Admitting: Neurology

## 2023-11-30 ENCOUNTER — Ambulatory Visit: Payer: BC Managed Care – PPO | Admitting: Neurology

## 2023-11-30 VITALS — BP 140/70 | HR 71 | Ht 69.0 in | Wt 278.5 lb

## 2023-11-30 DIAGNOSIS — R269 Unspecified abnormalities of gait and mobility: Secondary | ICD-10-CM

## 2023-11-30 DIAGNOSIS — M5416 Radiculopathy, lumbar region: Secondary | ICD-10-CM

## 2023-11-30 DIAGNOSIS — G35 Multiple sclerosis: Secondary | ICD-10-CM

## 2023-11-30 DIAGNOSIS — G2581 Restless legs syndrome: Secondary | ICD-10-CM

## 2023-11-30 DIAGNOSIS — G4761 Periodic limb movement disorder: Secondary | ICD-10-CM

## 2023-11-30 DIAGNOSIS — Z79899 Other long term (current) drug therapy: Secondary | ICD-10-CM | POA: Diagnosis not present

## 2023-11-30 DIAGNOSIS — G35D Multiple sclerosis, unspecified: Secondary | ICD-10-CM

## 2023-11-30 DIAGNOSIS — N3941 Urge incontinence: Secondary | ICD-10-CM

## 2023-11-30 MED ORDER — BACLOFEN 10 MG PO TABS: 10.0000 mg | ORAL_TABLET | Freq: Two times a day (BID) | ORAL | 11 refills | Status: AC

## 2023-11-30 MED ORDER — GABAPENTIN 300 MG PO CAPS: ORAL_CAPSULE | ORAL | 2 refills | Status: DC

## 2023-11-30 NOTE — Telephone Encounter (Signed)
 Last seen on 07/20/23 Follow up scheduled on 11/30/23

## 2023-11-30 NOTE — Progress Notes (Signed)
 [facility]  PATIENT: [name] DOB: 03-Jun-1961  REFERRING DOCTOR OR PCP:  Dr. Beauty Bourbon SOURCE: Patient, note from Dr. Tilda Fogo and Clem Currier, NP, laboratory and imaging reports, MRI images personally reviewed.  _________________________________   HISTORICAL  CHIEF COMPLAINT:  Chief Complaint  Patient presents with   Follow-up    RM 11, alone. Last seen 07/20/23. Started Mavenclad year 1 09/28/2023. She has had severe insomnia after YR1Month 2 of Mavenclad.    HISTORY OF PRESENT ILLNESS:  [name] is a 63 y.o. woman with multiple sclerosis.  Update 11/30/2023 She did the [facility] Feb and March 2024.  Below she tolerated the actual week she took the medication, she has noted more restless leg type syndromes at night.  She had increased symptoms the lat 1-2 months With more dizziness.   Fatigue and focus worsened.   An MRI of the brian and cervical spine showed a new enhancing lesion in the brain and new enhancing lesion in the spinal cord.   Her previous exacerbation was in 2014 before the switch to Tysabri .  However, over the last year, she notes she leans more to the left or it is mildly weaker.    She has Auto-PAP and d/l shows good efficacy (AHI=4/0) but poor compliance 47% . She has trouble getting comfortable with it.   She has a nasal bar with tube top of head.   RLS and leg pain bothers her more at night the past year, espe the last month.  Pramipexole  0.375 CR plus 2 x 0.25 IR at night.   She is on gabapentin  300 mg tid  and She is always tired.     Armodafinil  helps sleepiness.  She is walking without an aide. Stairs are difficult.  Muscles tire out easily.   She feels she could walk a half mile without a break but was better last year.   Her left side is weaker than her right and she has some left leg spasticity.   She has urinary urgency and rare urge incontinence.   She has not been placed on any medication for this.    She has had chronic  left  uveitis.      She sleeps poorly and notes restless legs and just general restlessness.   She is taking Mirapex  XR once a day and additional dose at bedtime.  She still wakes up with the restless sensation but since the nighttime dose was increased, she has done better.    She still notes left leg spasticity.       She had cervical ACDF 12/2019 C5-C7.   She had lumbar surgery (L4-L5 microdiskectomy) around 2018.     She continues to have left neck pain and pain down the left arm.   Her back is hurting her morPain will go down to the elbow but not to the hand.     MS History: She was diagnosed in 2000 after presenting with unilateral numbness.   She ws started on Avonex. .   In retrospect, she also had left optic neuritis in 1996.     She switched from Avonex to Rebif due to breakthrough activity.   She was switched to Tysabri  due to breakthrough activity.   She became JCV Ab positive and switched to Ocrevus  in 2022.      She had a relapse with 2 ehancing lesions on MRI in Novemebr 2024 and will be switched to [facility]  Imaging: MRI Brain 05/09/2020 shows   Multiple T2/FLAIR  hyperintense foci in the cerebellum, brainstem, thalamus and hemispheres in a pattern and configuration consistent with chronic demyelinating plaque associated with multiple sclerosis. None of the foci appears to be acute.  No changes compared to 06/28/2019  MRI cervical spine 05/09/2020 shows    There are T2 hyperintense foci within the posterior spinal cord adjacent to C2 , anteriorly adjacent to C3, posteriorly adjacent to C3-C4 and anteriorly to the left adjacent to C4-C5.and multilevel DJD with mild spinal stenosis at C4C5 and C6C6 and significant foraminal narrowing at C3C4 and C5C6.     Potential for right C4, right C5, bilateral C6 and left C7 nerve root compression.    MRI of the brain 11/19/2021 shows T2/FLAIR hyperintense foci in the cerebellum, middle cerebellar peduncle, thalamus, upper spinal cord and in the cerebral  hemispheres in a pattern and configuration consistent with chronic demyelinating plaque associated with multiple sclerosis. None of the foci appear to be acute. Compared to the MRI from 05/09/2020, there were no new lesions.   MRI of the cervical spine 11/19/2021 shows multiple T2 hyperintense foci within the spinal cord. These are located adjacent to C1-C2, adjacent to C2, anteriorly adjacent to C2-C3/anterocentrally adjacent to C3, posterolaterally to the right adjacent to C3-C4. Compared to the MRI dated 05/09/2020, there did not appear to be any new lesions. .   C4-C7 ACDF.  There is residual mild spinal stenosis at C6-C7 though there does not appear to be nerve root compression at any of these levels.  .   Mild degenerative changes at C2-C3 and C3-C4 that do not lead to any nerve root compression or spinal stenosis.  MRI of the brain 07/08/2023 showed an enhancing lesion in the periventricular white matter of the left frontal lobe.  This lesion was not present on the previous MRI from 2023.  Other lesions appeared unchanged.  MRI of the cervical spine 07/08/2023 showed a small new enhancing ventral spinal cord lesion at C7 not present on the previous MRI.  She has additional foci in the spinal cord unchanged compared to the previous MRI.  MRI of the lumbar spine 11/17/2017 shows disc herniation towards the left causing severe lateral recess stenosis and probable left L5 nerve root compression.  At L5-S1, there is severe loss of disc height, disc bulging, endplate spurring and facet hypertrophy causing spinal stenosis and moderate left foraminal narrowing that could affect the left L5 nerve root.  REVIEW OF SYSTEMS: Constitutional: No fevers, chills, sweats, or change in appetite Eyes: No visual changes, double vision, eye pain Ear, nose and throat: No hearing loss, ear pain, nasal congestion, sore throat Cardiovascular: No chest pain, palpitations Respiratory:  No shortness of breath at rest or with  exertion.   No wheezes GastrointestinaI: No nausea, vomiting, diarrhea, abdominal pain, fecal incontinence Genitourinary:  No dysuria, urinary retention or frequency.  No nocturia. Musculoskeletal:  No neck pain, back pain Integumentary: No rash, pruritus, skin lesions Neurological: as above Psychiatric: No depression at this time.  No anxiety Endocrine: No palpitations, diaphoresis, change in appetite, change in weigh or increased thirst Hematologic/Lymphatic:  No anemia, purpura, petechiae. Allergic/Immunologic: No itchy/runny eyes, nasal congestion, recent allergic reactions, rashes  ALLERGIES: Allergies  Allergen Reactions   Penicillins Rash    HOME MEDICATIONS:  Current Outpatient Medications:    amLODipine  (NORVASC ) 5 MG tablet, Take 5 mg by mouth daily., Disp: , Rfl:    Armodafinil  150 MG tablet, 1/2 tablet to 1 tablet po qAM as needed (Patient taking differently: Take 0.5  tablets by mouth daily as needed (daytime sleepiness).), Disp: 90 tablet, Rfl: 1   atorvastatin  (LIPITOR) 10 MG tablet, Take 10 mg by mouth daily., Disp: , Rfl:    buPROPion  (WELLBUTRIN  XL) 150 MG 24 hr tablet, Take 150 mg by mouth every morning., Disp: , Rfl:    famotidine  (PEPCID ) 20 MG tablet, Take 1 tablet (20 mg total) by mouth 2 (two) times daily., Disp: 30 tablet, Rfl: 3   fluticasone  (FLONASE ) 50 MCG/ACT nasal spray, Place 2 sprays into both nostrils 2 (two) times daily., Disp: 16 g, Rfl: 6   hydrOXYzine (ATARAX) 10 MG tablet, Take 10 mg by mouth as needed., Disp: , Rfl:    ibuprofen  (ADVIL ,MOTRIN ) 200 MG tablet, Take 400 mg by mouth every 6 (six) hours as needed., Disp: , Rfl:    losartan  (COZAAR ) 100 MG tablet, Take 100 mg by mouth daily., Disp: , Rfl:    meloxicam  (MOBIC ) 15 MG tablet, TAKE 1 TABLET (15 MG TOTAL) BY MOUTH DAILY., Disp: 30 tablet, Rfl: 3   pramipexole  (MIRAPEX ) 0.25 MG tablet, Take one or two po qHS, Disp: 180 tablet, Rfl: 3   Pramipexole  Dihydrochloride 0.375 MG TB24, TAKE 1 TABLET  (0.375 MG TOTAL) BY MOUTH AT BEDTIME., Disp: 90 tablet, Rfl: 3   prednisoLONE  acetate (PRED FORTE ) 1 % ophthalmic suspension, As needed, Disp: , Rfl:    baclofen  (LIORESAL ) 10 MG tablet, Take 1 tablet (10 mg total) by mouth 2 (two) times daily., Disp: 60 tablet, Rfl: 11   gabapentin  (NEURONTIN ) 300 MG capsule, One po qAM, ne po qPM and two po qHS, Disp: 120 capsule, Rfl: 2   pantoprazole  (PROTONIX ) 40 MG tablet, Take 1 tablet (40 mg total) by mouth 2 (two) times daily., Disp: 60 tablet, Rfl: 0  PAST MEDICAL HISTORY: Past Medical History:  Diagnosis Date   Asthma    Back pain    Cardiac/pericardial tamponade 06/11/2014   s/p pericardial window 06/16/2014 per Dr. Jeb Miner   Depression    Hypertension    MS (multiple sclerosis) (HCC)    Muscle pain    Obesity    Periodic limb movement disorder (PLMD) 07/15/2016   Pleural effusion, bilateral 06/30/2014   Uveitis    Bilateral    PAST SURGICAL HISTORY: Past Surgical History:  Procedure Laterality Date   CERVICAL FUSION     01-02-21, [place] C5-7   microdisectomy  2019   lumbar spine   PERICARDIAL TAP N/A 06/11/2014   Procedure: PERICARDIAL TAP;  Surgeon: Arlander Bellman, MD;  Location[facility] CATH LAB;  Service: Cardiovascular;  Laterality: N/A;   SUBXYPHOID PERICARDIAL WINDOW N/A 06/16/2014   Procedure: SUBXYPHOID PERICARDIAL WINDOW;  Surgeon: Heriberto London, MD;  Location: [facility] OR;  Service: Thoracic;  Laterality: N/A;   TEE WITHOUT CARDIOVERSION N/A 06/16/2014   Procedure: TRANSESOPHAGEAL ECHOCARDIOGRAM (TEE);  Surgeon: Heriberto London, MD;  Location[facility] OR;  Service: Thoracic;  Laterality: N/A;   uterine ablasion  2012    FAMILY HISTORY: Family History  Problem Relation Age of Onset   Breast cancer Mother    Hypertension Mother    Diabetes Mother    Heart attack Father    Epilepsy Father    Endocrine tumor Brother    Multiple sclerosis Neg Hx     SOCIAL HISTORY:  Social History   Socioeconomic History   Marital  status: Married    Spouse name: Not on file   Number of children: 0   Years of education: [place]  Highest education level: Not on file  Occupational History   Occupation: [employer]  Tobacco Use   Smoking status: Never   Smokeless tobacco: Never  Vaping Use   Vaping status: Some Days  Substance and Sexual Activity   Alcohol use: Yes    Alcohol/week: 3.0 standard drinks of alcohol    Types: 3 Standard drinks or equivalent per week    Comment: occasional   Drug use: Yes    Comment: marijuana   Sexual activity: Not Currently  Other Topics Concern   Not on file  Social History Narrative   Patient lives at home with her wife Bobbye Burrow)    Patient works full time.   Financial controller.   Right handed.   Caffeine two cups daily.   Social Drivers of Corporate investment banker Strain: Not on file  Food Insecurity: No Food Insecurity (07/10/2023)   Hunger Vital Sign    Worried About Running Out of Food in the Last Year: Never true    Ran Out of Food in the Last Year: Never true  Transportation Needs: No Transportation Needs (07/10/2023)   PRAPARE - Administrator, Civil Service (Medical): No    Lack of Transportation (Non-Medical): No  Physical Activity: Not on file  Stress: Not on file  Social Connections: Not on file  Intimate Partner Violence: Not At Risk (07/10/2023)   Humiliation, Afraid, Rape, and Kick questionnaire    Fear of Current or Ex-Partner: No    Emotionally Abused: No    Physically Abused: No    Sexually Abused: No     PHYSICAL EXAM  Vitals:   11/30/23 1454  BP: (!) 140/70  Pulse: 71  SpO2: 99%  Weight: 278 lb 8 oz (126.3 kg)  Height: 5\' 9"  (1.753 m)    Body mass index is 41.13 kg/m.   General: The patient is well-developed and well-nourished and in no acute distress  HEENT:  Head is [place]/AT.  Sclera are anicteric.    Mallampati 2.   16 inch neck circumference  Neck:   The neck is nontender.  Skin: Extremities are  without rash or  edema.  Musculoskeletal:  Back is nontender  Neurologic Exam  Mental status: The patient is alert and oriented x 3 at the time of the examination. The patient has apparent normal recent and remote memory, with an apparently normal attention span and concentration ability.   Speech is normal.  Cranial nerves: Extraocular movements are full.  Facial strength and sensation was normal.  Trapezius strength was normal.   No obvious hearing deficits are noted.  Motor:  Muscle bulk is normal.   Tone is normal. Strength is  5 / 5 in all 4 extremities.   Sensory: Sensory testing is intact to pinprick, soft touch and vibration sensation in all 4 extremities.  Coordination: Cerebellar testing reveals good finger-nose-finger and heel-to-shin bilaterally.  Gait and station: Station is normal.  The gait is mildly  wide.  The tandem gait is poor.  Romberg is negative.  Reflexes: Deep tendon reflexes are symmetric and normal in the arms and increase in the legs with spreasd at knees.  However, there is no ankle clonus.Aaron Aas       DIAGNOSTIC DATA (LABS, IMAGING, TESTING) - I reviewed patient records, labs, notes, testing and imaging myself where available.  Lab Results  Component Value Date   WBC 6.1 07/20/2023   HGB 13.5 07/20/2023   HCT 41.4 07/20/2023   MCV  92 07/20/2023   PLT 224 07/20/2023      Component Value Date/Time   NA 134 (L) 07/11/2023 0355   NA 136 06/08/2023 1113   K 3.8 07/11/2023 0355   CL 103 07/11/2023 0355   CO2 21 (L) 07/11/2023 0355   GLUCOSE 161 (H) 07/11/2023 0355   BUN 16 07/11/2023 0355   BUN 15 06/08/2023 1113   CREATININE 0.79 07/11/2023 0355   CREATININE 0.76 06/27/2014 0915   CALCIUM  9.3 07/11/2023 0355   PROT 7.2 07/11/2023 0355   PROT 6.7 06/08/2023 1113   ALBUMIN 3.9 07/11/2023 0355   ALBUMIN 4.0 06/08/2023 1113   AST 17 07/11/2023 0355   ALT 17 07/11/2023 0355   ALKPHOS 90 07/11/2023 0355   BILITOT 0.5 07/11/2023 0355   BILITOT 0.3  06/08/2023 1113   GFRNONAA >60 07/11/2023 0355   GFRAA 99 04/24/2020 0915    Lab Results  Component Value Date   VITAMINB12 479 04/24/2020   Lab Results  Component Value Date   TSH 1.300 04/24/2020       ASSESSMENT AND PLAN  Multiple sclerosis (HCC) - Plan: CBC with Differential/Platelet, Comprehensive metabolic panel with GFR  Lumbar radiculopathy - Plan: MR Lumbar Spine W Wo Contrast  High risk medication use - Plan: CBC with Differential/Platelet, Comprehensive metabolic panel with GFR  Gait disorder  RLS (restless legs syndrome)  Urge incontinence  Periodic limb movement disorder (PLMD)   She had an exacerbation with MRI changes while on Ocrevus .  She is initiated [facility] February 2025.  She will have a second year in early 2026.  We will check some lab work today.  If the lymphocyte count is 0.2 or lower I will add valacyclovir. Worsening leg pain is unlikely to be MS related and more likely to be due to lumbar degenerative changes and restless leg syndrome\.  She will increase the nighttime gabapentin  to 600 mg (300 - 300 - 600), continue pramipexole  and add baclofen  10 mg at night.   Stay active and exercise as tolerated.   Rtc 6 months   Meliya Mcconahy A. Godwin Lat, MD, [facility] 11/30/2023, 4:32 PM Certified in Neurology, Clinical Neurophysiology, Sleep Medicine and Neuroimaging  [facility] [address] [place], [place] [address] [phone]

## 2023-12-01 ENCOUNTER — Encounter: Payer: Self-pay | Admitting: Neurology

## 2023-12-01 LAB — CBC WITH DIFFERENTIAL/PLATELET
Basophils Absolute: 0 10*3/uL (ref 0.0–0.2)
Basos: 0 %
EOS (ABSOLUTE): 0.1 10*3/uL (ref 0.0–0.4)
Eos: 2 %
Hematocrit: 37.4 % (ref 34.0–46.6)
Hemoglobin: 12.7 g/dL (ref 11.1–15.9)
Immature Grans (Abs): 0 10*3/uL (ref 0.0–0.1)
Immature Granulocytes: 0 %
Lymphocytes Absolute: 0.4 10*3/uL — ABNORMAL LOW (ref 0.7–3.1)
Lymphs: 9 %
MCH: 31.3 pg (ref 26.6–33.0)
MCHC: 34 g/dL (ref 31.5–35.7)
MCV: 92 fL (ref 79–97)
Monocytes Absolute: 0.5 10*3/uL (ref 0.1–0.9)
Monocytes: 10 %
Neutrophils Absolute: 3.9 10*3/uL (ref 1.4–7.0)
Neutrophils: 79 %
Platelets: 184 10*3/uL (ref 150–450)
RBC: 4.06 x10E6/uL (ref 3.77–5.28)
RDW: 13 % (ref 11.7–15.4)
WBC: 5 10*3/uL (ref 3.4–10.8)

## 2023-12-01 LAB — COMPREHENSIVE METABOLIC PANEL WITH GFR
ALT: 17 IU/L (ref 0–32)
AST: 16 IU/L (ref 0–40)
Albumin: 4.2 g/dL (ref 3.8–4.9)
Alkaline Phosphatase: 137 IU/L — ABNORMAL HIGH (ref 44–121)
BUN/Creatinine Ratio: 20 (ref 12–28)
BUN: 16 mg/dL (ref 8–27)
Bilirubin Total: 0.3 mg/dL (ref 0.0–1.2)
CO2: 25 mmol/L (ref 20–29)
Calcium: 9.3 mg/dL (ref 8.7–10.3)
Chloride: 101 mmol/L (ref 96–106)
Creatinine, Ser: 0.82 mg/dL (ref 0.57–1.00)
Globulin, Total: 2.6 g/dL (ref 1.5–4.5)
Glucose: 87 mg/dL (ref 70–99)
Potassium: 4.8 mmol/L (ref 3.5–5.2)
Sodium: 137 mmol/L (ref 134–144)
Total Protein: 6.8 g/dL (ref 6.0–8.5)
eGFR: 82 mL/min/{1.73_m2} (ref >59)

## 2023-12-02 ENCOUNTER — Other Ambulatory Visit: Payer: Self-pay | Admitting: Adult Health

## 2023-12-02 ENCOUNTER — Other Ambulatory Visit: Payer: Self-pay

## 2023-12-02 DIAGNOSIS — M67442 Ganglion, left hand: Secondary | ICD-10-CM | POA: Diagnosis not present

## 2023-12-02 DIAGNOSIS — G5601 Carpal tunnel syndrome, right upper limb: Secondary | ICD-10-CM | POA: Diagnosis not present

## 2023-12-02 DIAGNOSIS — M65342 Trigger finger, left ring finger: Secondary | ICD-10-CM | POA: Diagnosis not present

## 2023-12-14 ENCOUNTER — Encounter: Payer: Self-pay | Admitting: Neurology

## 2023-12-16 DIAGNOSIS — G4733 Obstructive sleep apnea (adult) (pediatric): Secondary | ICD-10-CM | POA: Diagnosis not present

## 2023-12-27 ENCOUNTER — Other Ambulatory Visit: Payer: Self-pay | Admitting: Podiatry

## 2024-02-01 ENCOUNTER — Encounter (HOSPITAL_BASED_OUTPATIENT_CLINIC_OR_DEPARTMENT_OTHER): Payer: Self-pay

## 2024-02-01 ENCOUNTER — Emergency Department (HOSPITAL_BASED_OUTPATIENT_CLINIC_OR_DEPARTMENT_OTHER): Admitting: Radiology

## 2024-02-01 ENCOUNTER — Other Ambulatory Visit: Payer: Self-pay

## 2024-02-01 ENCOUNTER — Emergency Department (HOSPITAL_BASED_OUTPATIENT_CLINIC_OR_DEPARTMENT_OTHER)
Admission: EM | Admit: 2024-02-01 | Discharge: 2024-02-01 | Disposition: A | Discharge status: Home / Self Care | Attending: Emergency Medicine | Admitting: Emergency Medicine

## 2024-02-01 DIAGNOSIS — M7989 Other specified soft tissue disorders: Secondary | ICD-10-CM | POA: Diagnosis not present

## 2024-02-01 DIAGNOSIS — R0789 Other chest pain: Secondary | ICD-10-CM | POA: Insufficient documentation

## 2024-02-01 DIAGNOSIS — Z7951 Long term (current) use of inhaled steroids: Secondary | ICD-10-CM | POA: Diagnosis not present

## 2024-02-01 DIAGNOSIS — R0600 Dyspnea, unspecified: Secondary | ICD-10-CM | POA: Diagnosis not present

## 2024-02-01 DIAGNOSIS — J45909 Unspecified asthma, uncomplicated: Secondary | ICD-10-CM | POA: Insufficient documentation

## 2024-02-01 DIAGNOSIS — R079 Chest pain, unspecified: Secondary | ICD-10-CM

## 2024-02-01 DIAGNOSIS — I10 Essential (primary) hypertension: Secondary | ICD-10-CM | POA: Insufficient documentation

## 2024-02-01 DIAGNOSIS — R5383 Other fatigue: Secondary | ICD-10-CM | POA: Diagnosis present

## 2024-02-01 DIAGNOSIS — Z79899 Other long term (current) drug therapy: Secondary | ICD-10-CM | POA: Diagnosis not present

## 2024-02-01 DIAGNOSIS — R29898 Other symptoms and signs involving the musculoskeletal system: Secondary | ICD-10-CM | POA: Diagnosis not present

## 2024-02-01 LAB — URINALYSIS, W/ REFLEX TO CULTURE (INFECTION SUSPECTED)
Bacteria, UA: NONE SEEN
Bilirubin Urine: NEGATIVE
Glucose, UA: NEGATIVE mg/dL
Ketones, ur: NEGATIVE mg/dL
Leukocytes,Ua: NEGATIVE
Nitrite: NEGATIVE
Protein, ur: NEGATIVE mg/dL
Specific Gravity, Urine: 1.016 (ref 1.005–1.030)
pH: 6.5 (ref 5.0–8.0)

## 2024-02-01 LAB — CBC WITH DIFFERENTIAL/PLATELET
Abs Immature Granulocytes: 0.01 10*3/uL (ref 0.00–0.07)
Basophils Absolute: 0 10*3/uL (ref 0.0–0.1)
Basophils Relative: 1 %
Eosinophils Absolute: 0.1 10*3/uL (ref 0.0–0.5)
Eosinophils Relative: 3 %
HCT: 37.3 % (ref 36.0–46.0)
Hemoglobin: 12.7 g/dL (ref 12.0–15.0)
Immature Granulocytes: 0 %
Lymphocytes Relative: 18 %
Lymphs Abs: 0.7 10*3/uL (ref 0.7–4.0)
MCH: 30.7 pg (ref 26.0–34.0)
MCHC: 34 g/dL (ref 30.0–36.0)
MCV: 90.1 fL (ref 80.0–100.0)
Monocytes Absolute: 0.5 10*3/uL (ref 0.1–1.0)
Monocytes Relative: 13 %
Neutro Abs: 2.6 10*3/uL (ref 1.7–7.7)
Neutrophils Relative %: 65 %
Platelets: 179 10*3/uL (ref 150–400)
RBC: 4.14 MIL/uL (ref 3.87–5.11)
RDW: 12 % (ref 11.5–15.5)
WBC: 4 10*3/uL (ref 4.0–10.5)
nRBC: 0 % (ref 0.0–0.2)

## 2024-02-01 LAB — COMPREHENSIVE METABOLIC PANEL WITH GFR
ALT: 13 U/L (ref 0–44)
AST: 19 U/L (ref 15–41)
Albumin: 4.1 g/dL (ref 3.5–5.0)
Alkaline Phosphatase: 112 U/L (ref 38–126)
Anion gap: 12 (ref 5–15)
BUN: 15 mg/dL (ref 6–20)
CO2: 24 mmol/L (ref 22–32)
Calcium: 9.4 mg/dL (ref 8.9–10.3)
Chloride: 101 mmol/L (ref 98–111)
Creatinine, Ser: 0.81 mg/dL (ref 0.44–1.00)
GFR, Estimated: >60 mL/min (ref >60)
Glucose, Bld: 91 mg/dL (ref 70–99)
Potassium: 4.4 mmol/L (ref 3.5–5.1)
Sodium: 137 mmol/L (ref 135–145)
Total Bilirubin: 0.4 mg/dL (ref 0.0–1.2)
Total Protein: 6.8 g/dL (ref 6.5–8.1)

## 2024-02-01 LAB — TSH: TSH: 1.16 u[IU]/mL (ref 0.350–4.500)

## 2024-02-01 LAB — PRO BRAIN NATRIURETIC PEPTIDE: Pro Brain Natriuretic Peptide: 419 pg/mL — ABNORMAL HIGH (ref <300.0)

## 2024-02-01 LAB — TROPONIN T, HIGH SENSITIVITY
Troponin T High Sensitivity: <15 ng/L (ref <19)
Troponin T High Sensitivity: <15 ng/L (ref <19)

## 2024-02-01 LAB — D-DIMER, QUANTITATIVE: D-Dimer, Quant: 0.52 ug{FEU}/mL — ABNORMAL HIGH (ref 0.00–0.50)

## 2024-02-01 NOTE — ED Triage Notes (Signed)
 In for eval of increasing weakness in legs. Recent traveling for work last 2 weeks. PMH MS. Nausea. Fatigue. SOB. Edema to left leg with walking. Aching and nerve pain. Chest discomfort.

## 2024-02-01 NOTE — ED Notes (Signed)
 Pt discharged home and given discharge paperwork. Opportunities given for questions. Pt verbalizes understanding. PIV removed x1. Jillyn Hidden , RN

## 2024-02-01 NOTE — ED Provider Notes (Signed)
 [place] EMERGENCY DEPARTMENT AT [facility] Provider Note  CSN: [id number] Arrival date & time: 02/01/24 9052  Chief Complaint(s) Extremity Weakness and Nausea  HPI [name] is a 63 y.o. female who is here today for feeling unwell.  Patient reports that over the last half week, she has been having some pain in her chest, shortness of breath, fatigue, she is also intermittently noticed some swelling in her left leg.  Patient reports that she recently traveled for work, she has a history of MS.  She denies any fever or chills, no vomiting or diarrhea.   Past Medical History Past Medical History:  Diagnosis Date   Asthma    Back pain    Cardiac/pericardial tamponade 06/11/2014   s/p pericardial window 06/16/2014 per Dr. Obadiah   Depression    Hypertension    MS (multiple sclerosis) (HCC)    Muscle pain    Obesity    Periodic limb movement disorder (PLMD) 07/15/2016   Pleural effusion, bilateral 06/30/2014   Uveitis    Bilateral   Patient Active Problem List   Diagnosis Date Noted   Multiple sclerosis with acute neurologic event (HCC) 07/10/2023   Upper airway cough syndrome 06/11/2023   Cervical radiculopathy 10/28/2021   High risk medication use 10/28/2021   Cervical disc disorder with radiculopathy 10/19/2017   Periodic limb movement disorder (PLMD) 07/15/2016   Pleural effusion, bilateral 06/30/2014   Shortness of breath 06/29/2014   Hypertension 06/29/2014   Pericarditis 06/29/2014   Pericardial effusion 06/16/2014   Pericardial effusion, acute 06/14/2014   Cardiac/pericardial tamponade 06/11/2014   Cardiac tamponade 06/11/2014   Multiple sclerosis (HCC) 11/21/2013   Home Medication(s) Prior to Admission medications   Medication Sig Start Date End Date Taking? Authorizing Provider  amLODipine  (NORVASC ) 5 MG tablet Take 5 mg by mouth daily. 07/08/23   [provider]  Armodafinil  150 MG tablet 1/2 tablet to 1 tablet po qAM as needed Patient  taking differently: Take 0.5 tablets by mouth daily as needed (daytime sleepiness). 06/08/23   Sherryl Bouchard, NP  atorvastatin  (LIPITOR) 10 MG tablet Take 10 mg by mouth daily.    [provider]  baclofen  (LIORESAL ) 10 MG tablet Take 1 tablet (10 mg total) by mouth 2 (two) times daily. 11/30/23   Sater, Charlie LABOR, MD  buPROPion  (WELLBUTRIN  XL) 150 MG 24 hr tablet Take 150 mg by mouth every morning. 03/21/22   [provider]  famotidine  (PEPCID ) 20 MG tablet Take 1 tablet (20 mg total) by mouth 2 (two) times daily. 07/03/23   Soldatova, Liuba, MD  fluticasone  (FLONASE ) 50 MCG/ACT nasal spray Place 2 sprays into both nostrils 2 (two) times daily. 07/03/23   Soldatova, Liuba, MD  gabapentin  (NEURONTIN ) 300 MG capsule One po qAM, ne po qPM and two po qHS 11/30/23   Sater, Charlie LABOR, MD  hydrOXYzine (ATARAX) 10 MG tablet Take 10 mg by mouth as needed. 11/19/23   [provider]  ibuprofen  (ADVIL ,MOTRIN ) 200 MG tablet Take 400 mg by mouth every 6 (six) hours as needed.    [provider]  losartan  (COZAAR ) 100 MG tablet Take 100 mg by mouth daily. 07/02/23   [provider]  meloxicam  (MOBIC ) 15 MG tablet TAKE 1 TABLET (15 MG TOTAL) BY MOUTH DAILY. 12/28/23   Hyatt, Max T, DPM  pantoprazole  (PROTONIX ) 40 MG tablet Take 1 tablet (40 mg total) by mouth 2 (two) times daily. 07/11/23 08/10/23  Cindy Garnette POUR, MD  pramipexole  (MIRAPEX ) 0.25 MG  tablet Take one or two po qHS 08/20/23   Sater, Charlie LABOR, MD  Pramipexole  Dihydrochloride 0.375 MG TB24 TAKE 1 TABLET (0.375 MG TOTAL) BY MOUTH AT BEDTIME. 12/03/23   Sater, Charlie LABOR, MD  prednisoLONE  acetate (PRED FORTE ) 1 % ophthalmic suspension As needed    [provider]                                                                                                                                    Past Surgical History Past Surgical History:  Procedure Laterality Date   CERVICAL FUSION     01-02-21, [place]  NS C5-7   microdisectomy  2019   lumbar spine   PERICARDIAL TAP N/A 06/11/2014   Procedure: PERICARDIAL TAP;  Surgeon: Ozell JONETTA Fell, MD;  Location: [facility] CATH LAB;  Service: Cardiovascular;  Laterality: N/A;   SUBXYPHOID PERICARDIAL WINDOW N/A 06/16/2014   Procedure: SUBXYPHOID PERICARDIAL WINDOW;  Surgeon: Maude Fleeta Ochoa, MD;  Location: [facility] OR;  Service: Thoracic;  Laterality: N/A;   TEE WITHOUT CARDIOVERSION N/A 06/16/2014   Procedure: TRANSESOPHAGEAL ECHOCARDIOGRAM (TEE);  Surgeon: Maude Fleeta Ochoa, MD;  Location: [facility] OR;  Service: Thoracic;  Laterality: N/A;   uterine ablasion  2012   Family History Family History  Problem Relation Age of Onset   Breast cancer Mother    Hypertension Mother    Diabetes Mother    Heart attack Father    Epilepsy Father    Endocrine tumor Brother    Multiple sclerosis Neg Hx     Social History Social History   Tobacco Use   Smoking status: Never   Smokeless tobacco: Never  Vaping Use   Vaping status: Some Days  Substance Use Topics   Alcohol use: Yes    Alcohol/week: 3.0 standard drinks of alcohol    Types: 3 Standard drinks or equivalent per week    Comment: occasional   Drug use: Yes    Comment: marijuana   Allergies Penicillins  Review of Systems Review of Systems  Physical Exam Vital Signs  I have reviewed the triage vital signs BP 133/65   Pulse (!) 55   Temp 97.6 F (36.4 C) (Oral)   Resp 17   Ht 5' 9 (1.753 m)   Wt 122.5 kg   SpO2 100%   BMI 39.87 kg/m   Physical Exam Vitals reviewed.  Constitutional:      Appearance: Normal appearance.  HENT:     Head: Normocephalic.   Cardiovascular:     Rate and Rhythm: Normal rate.     Pulses: Normal pulses.  Pulmonary:     Effort: Pulmonary effort is normal. No respiratory distress.     Breath sounds: No wheezing.  Abdominal:     General: Abdomen is flat. There is no distension.     Palpations: Abdomen is soft.     Tenderness: There is no abdominal tenderness.  There is no  guarding.   Musculoskeletal:        General: Normal range of motion.     Cervical back: Normal range of motion.     Right lower leg: No edema.     Left lower leg: No edema.   Skin:    General: Skin is warm.     Findings: No rash.   Neurological:     Mental Status: She is alert.     ED Results and Treatments Labs (all labs ordered are listed, but only abnormal results are displayed) Labs Reviewed  D-DIMER, QUANTITATIVE - Abnormal; Notable for the following components:      Result Value   D-Dimer, Quant 0.52 (*)    All other components within normal limits  PRO BRAIN NATRIURETIC PEPTIDE - Abnormal; Notable for the following components:   Pro Brain Natriuretic Peptide 419.0 (*)    All other components within normal limits  URINALYSIS, W/ REFLEX TO CULTURE (INFECTION SUSPECTED) - Abnormal; Notable for the following components:   Hgb urine dipstick TRACE (*)    All other components within normal limits  CBC WITH DIFFERENTIAL/PLATELET  COMPREHENSIVE METABOLIC PANEL WITH GFR  TSH  TROPONIN T, HIGH SENSITIVITY  TROPONIN T, HIGH SENSITIVITY                                                                                                                          Radiology DG Chest 1 View Result Date: 02/01/2024 CLINICAL DATA:  Dyspnea. Increasing leg weakness. Fatigue and shortness of breath. EXAM: CHEST  1 VIEW COMPARISON:  09/07/2023 FINDINGS: Lower cervical plate and screw fixator noted. Lower thoracic spondylosis. Heart size within normal limits for projection. No blunting of the costophrenic angles. The lungs appear clear. Suspected small degenerative subcortical cysts or geodes along the left humeral head. IMPRESSION: 1. No acute findings. 2. Lower thoracic spondylosis. Electronically Signed   By: Ryan Salvage M.D.   On: 02/01/2024 13:18    Pertinent labs & imaging results that were available during my care of the patient were reviewed by me and considered in my  medical decision making (see MDM for details).  Medications Ordered in ED Medications - No data to display                                                                                                                                   Procedures Procedures  (including critical  care time)  Medical Decision Making / ED Course   This patient presents to the ED for concern of feeling unwell, this involves an extensive number of treatment options, and is a complaint that carries with it a high risk of complications and morbidity.  The differential diagnosis includes lab abnormalities, ACS, dehydration, viral syndrome, venous thrombus, pneumonia, cystitis, thyroid dysfunction.  MDM: Overall, patient well-appearing.  She has normal vital signs.  Clear lungs, soft abdomen, no swelling or erythema of the bilateral lower extremities.  With the patient's broad symptoms, will perform a broad workup.  Symptoms could be related to ACS.  EKG and troponin ordered.  D-dimer to help dictate if imaging is indicated.  Basic blood work and TSH ordered.  Reassessment 3 PM-I reviewed the patient's labs.  Her D-dimer is age-adjusted negative.  Her BNP is also age-adjusted negative.  Troponins negative x 2.  CBC and CMP normal.  Urinalysis negative.  TSH negative.  Had a lengthy discussion with the patient and her partner at bedside.  Reportedly the patient has been dealing with this intermittent swelling in her legs over the last several months.  She is also occasionally had some pain in her legs.  But this is likely more chronic in nature.  Patient has good pulses bilaterally.  Will discharge patient, have her follow-up with her PCP.  Additional history obtained: -Additional history obtained from partner at bedside -External records from outside source obtained and reviewed including: Chart review including previous notes, labs, imaging, consultation notes   Lab Tests: -I ordered, reviewed, and interpreted  labs.   The pertinent results include:   Labs Reviewed  D-DIMER, QUANTITATIVE - Abnormal; Notable for the following components:      Result Value   D-Dimer, Quant 0.52 (*)    All other components within normal limits  PRO BRAIN NATRIURETIC PEPTIDE - Abnormal; Notable for the following components:   Pro Brain Natriuretic Peptide 419.0 (*)    All other components within normal limits  URINALYSIS, W/ REFLEX TO CULTURE (INFECTION SUSPECTED) - Abnormal; Notable for the following components:   Hgb urine dipstick TRACE (*)    All other components within normal limits  CBC WITH DIFFERENTIAL/PLATELET  COMPREHENSIVE METABOLIC PANEL WITH GFR  TSH  TROPONIN T, HIGH SENSITIVITY  TROPONIN T, HIGH SENSITIVITY      EKG my independent review of the patient's EKG shows no ST segment depressions or elevations, no T wave inversions, no evidence of acute ischemia.  EKG Interpretation Date/Time:  Monday February 01 2024 09:56:08 EDT Ventricular Rate:  60 PR Interval:  159 QRS Duration:  107 QT Interval:  402 QTC Calculation: 402 R Axis:   -8  Text Interpretation: Sinus rhythm Probable left atrial enlargement Low voltage, precordial leads Confirmed by Mannie Pac [phone]) on 02/01/2024 12:52:51 PM         Imaging Studies ordered: I ordered imaging studies including chest x-ray I independently visualized and interpreted imaging. I agree with the radiologist interpretation   Medicines ordered and prescription drug management: No orders of the defined types were placed in this encounter.   -I have reviewed the patients home medicines and have made adjustments as needed   Cardiac Monitoring: The patient was maintained on a cardiac monitor.  I personally viewed and interpreted the cardiac monitored which showed an underlying rhythm of: Normal sinus rhythm  Social Determinants of Health:  Factors impacting patients care include: Lack of access to primary care   Reevaluation: After the  interventions noted  above, I reevaluated the patient and found that they have :improved  Co morbidities that complicate the patient evaluation  Past Medical History:  Diagnosis Date   Asthma    Back pain    Cardiac/pericardial tamponade 06/11/2014   s/p pericardial window 06/16/2014 per Dr. Obadiah   Depression    Hypertension    MS (multiple sclerosis) (HCC)    Muscle pain    Obesity    Periodic limb movement disorder (PLMD) 07/15/2016   Pleural effusion, bilateral 06/30/2014   Uveitis    Bilateral      Dispostion: I considered admission for this patient, however she is appropriate for outpatient follow-up.     Final Clinical Impression(s) / ED Diagnoses Final diagnoses:  Leg swelling  Other fatigue  Chest pain, unspecified type     @PCDICTATION @    Mannie Pac T, DO 02/01/24 1508

## 2024-02-01 NOTE — Discharge Instructions (Signed)
 While you were in the emergency room, you had blood work done that was normal.  We checked for signs of injury to your heart, lungs, blood clots and these were all normal.  Your chest x-ray was normal.  Your urine did not have any infection, and your thyroid function was normal.  There are couple of additional tests that your PCP might consider ordering if they think it is indicated.  One potential test would be what is called an ankle-brachial index to assess your blood flow in your extremities.  They may consider an ultrasound of your heart called an echocardiogram.  I recommend wearing compression socks to help out with the swelling.  Please call your primary care doctor within 1 week for follow-up appointment.  Return to the emergency room for any increasing pain or swelling in your legs, difficulty with your breathing or pain in your chest.

## 2024-02-02 ENCOUNTER — Encounter: Payer: Self-pay | Admitting: Neurology

## 2024-02-02 DIAGNOSIS — G4733 Obstructive sleep apnea (adult) (pediatric): Secondary | ICD-10-CM

## 2024-02-02 NOTE — Telephone Encounter (Signed)
 I checked resmed [employer]. Pt set up with current machine 10/16/23. She needed an appointment by 01/14/24 for compliance.

## 2024-02-02 NOTE — Telephone Encounter (Addendum)
    Community message sent to [facility] that new order placed.

## 2024-02-02 NOTE — Telephone Encounter (Signed)
 Sent community message to [facility] to see what they need from us , waiting on response.

## 2024-02-03 ENCOUNTER — Encounter: Payer: Self-pay | Admitting: Neurology

## 2024-02-08 DIAGNOSIS — R062 Wheezing: Secondary | ICD-10-CM | POA: Diagnosis not present

## 2024-02-08 DIAGNOSIS — R051 Acute cough: Secondary | ICD-10-CM | POA: Diagnosis not present

## 2024-02-09 ENCOUNTER — Other Ambulatory Visit

## 2024-02-21 ENCOUNTER — Other Ambulatory Visit: Payer: Self-pay | Admitting: Neurology

## 2024-02-22 ENCOUNTER — Other Ambulatory Visit: Payer: Self-pay

## 2024-02-23 NOTE — Telephone Encounter (Signed)
 I left her a voice mail today to see if she wanted to get the MRI rescheduled.

## 2024-03-01 ENCOUNTER — Other Ambulatory Visit

## 2024-03-01 DIAGNOSIS — M5416 Radiculopathy, lumbar region: Secondary | ICD-10-CM | POA: Diagnosis not present

## 2024-03-01 MED ORDER — GADOBENATE DIMEGLUMINE 529 MG/ML IV SOLN
20.0000 mL | Freq: Once | INTRAVENOUS | Status: AC | PRN
  Administered 2024-03-01: 20 mL via INTRAVENOUS

## 2024-03-02 ENCOUNTER — Ambulatory Visit: Payer: Self-pay | Admitting: Neurology

## 2024-03-02 NOTE — Telephone Encounter (Signed)
-----   Message from Charlie DELENA Crete sent at 03/02/2024  8:30 AM EDT ----- Please let her know that the MRI of the lumbar spine did show that there has been some progression since her last MRI from about 5 years ago.  If she would like to do an epidural steroid injection,  we can order one at [facility]. ----- Message ----- From: [place] Charlie DELENA, MD Sent: 03/01/2024   1:09 PM EDT To: Charlie DELENA Crete, MD

## 2024-03-02 NOTE — Telephone Encounter (Signed)
 Called pt at [phone]. LVM for her to call office.

## 2024-03-16 DIAGNOSIS — M67442 Ganglion, left hand: Secondary | ICD-10-CM | POA: Diagnosis not present

## 2024-04-25 ENCOUNTER — Encounter: Payer: Self-pay | Admitting: Neurology

## 2024-04-25 ENCOUNTER — Ambulatory Visit (INDEPENDENT_AMBULATORY_CARE_PROVIDER_SITE_OTHER): Admitting: Neurology

## 2024-04-25 ENCOUNTER — Other Ambulatory Visit: Payer: Self-pay | Admitting: Podiatry

## 2024-04-25 VITALS — BP 122/83 | HR 67 | Ht 69.0 in | Wt 280.5 lb

## 2024-04-25 DIAGNOSIS — N3941 Urge incontinence: Secondary | ICD-10-CM

## 2024-04-25 DIAGNOSIS — R269 Unspecified abnormalities of gait and mobility: Secondary | ICD-10-CM | POA: Diagnosis not present

## 2024-04-25 DIAGNOSIS — M5417 Radiculopathy, lumbosacral region: Secondary | ICD-10-CM | POA: Diagnosis not present

## 2024-04-25 DIAGNOSIS — Z79899 Other long term (current) drug therapy: Secondary | ICD-10-CM | POA: Diagnosis not present

## 2024-04-25 DIAGNOSIS — G35 Multiple sclerosis: Secondary | ICD-10-CM | POA: Diagnosis not present

## 2024-04-25 MED ORDER — TRAMADOL HCL 50 MG PO TABS: 50.0000 mg | ORAL_TABLET | Freq: Three times a day (TID) | ORAL | 2 refills | Status: DC | PRN

## 2024-04-25 NOTE — Progress Notes (Signed)
 SABRA

## 2024-04-25 NOTE — Progress Notes (Signed)
 [facility]  PATIENT: [name] DOB: 05-31-1961  REFERRING DOCTOR OR PCP:  Dr. Elliot SOURCE: Patient, note from Dr. Jenel and Duwaine Russell, NP, laboratory and imaging reports, MRI images personally reviewed.  _________________________________   HISTORICAL  CHIEF COMPLAINT:  Chief Complaint  Patient presents with   Follow-up    Pt in room 10. Beverley wife in room. Here for MS/cpap follow up. Pt said she was traveling a lot and idn't use cpap while traveling. Pt said she will start using  cpap more she is back from traveling .    HISTORY OF PRESENT ILLNESS:  [name] is a 63 y.o. woman with multiple sclerosis.  Update 11/30/2023 She did the [facility] Feb and March 2025.  The lymphocyte count was 0.4 on 11/30/2023 and 0.7 on 02/01/2024.  She will be up for her second year of Mavenclad January or February 2026.  She tolerated the Mavenclad well.  She generally feels stable but notes more fatigue some days.    Before the exacerbation in November 2024, she had gone 10 years without another exacerbation..    She has OSA and is on Auto-PAP and d/l shows good efficacy (AHI=3.7) but poor compliance 3%. She has trouble getting comfortable with it.   She has started using it again (stopped with a trip to [place] and never went back on).  RLS and leg pain bothers her more at night the past year, espe the last month.  Pramipexole  0.375 CR plus 2 x 0.25 IR at night.   She is on gabapentin  300 mg (1-1-2)    Armodafinil  helps sleepiness.  She is able to walk without a cane or walker but notes doing worse when she goes longer distance.  Stairs are difficult.  She does not keep up with others.  Her left side is weaker than her right and she has some left leg spasticity.   She has urinary urgency and rare urge incontinence.   She has not been placed on any medication for this.    She has had chronic left  uveitis.    Just mild reduced acuity and colors not desaturated.     She had cervical ACDF 12/2019 C5-C7.   She had lumbar surgery (L4-L5 microdiskectomy) around 2018.     She has seen Dr. Alm Molt.  She continues to have left neck pain and pain down the left arm.     Her back is hurting her more and pain radiates into the left foot (S1 distrib).  We discussed ESI as lumbar MRI shows progressive changes likely causing radiculopathy.     The MRI of the lumbar spine 03/01/2024 showed At L2-L3, there are degenerative changes causing mild spinal stenosis that have progressed compared to the MRI from 11/17/2017.  There is no nerve root compression. At L3-L4, there are degenerative changes causing moderate spinal stenosis and moderately severe right lateral recess stenosis with potential for right L4 nerve root compression.  Degenerative changes have progressed compared to the 2019 MRI. At L4-L5, there are degenerative changes causing mild spinal stenosis but no nerve root compression.  The disc herniation noted in 2019 is resolved by the microdiscectomy.  There is no epidural fibrosis. At L5-S1, there are degenerative changes causing left greater than right foraminal and lateral recess stenosis with potential for left L5 and S1 nerve root compression.  The degenerative has had progressed compared to the 2019 MRI. Normal enhancement pattern.  MS History: She was diagnosed in 2000 after presenting  with unilateral numbness.   She ws started on Avonex. .   In retrospect, she also had left optic neuritis in 1996.     She switched from Avonex to Rebif due to breakthrough activity.   She was switched to Tysabri  due to breakthrough activity.   She became JCV Ab positive and switched to Ocrevus  in 2022.      She had a relapse with 2 ehancing lesions on MRI in Novemebr 2024 and will be switched to [facility]  Imaging: MRI Brain 05/09/2020 shows   Multiple T2/FLAIR hyperintense foci in the cerebellum, brainstem, thalamus and hemispheres in a pattern and configuration consistent with  chronic demyelinating plaque associated with multiple sclerosis. None of the foci appears to be acute.  No changes compared to 06/28/2019  MRI cervical spine 05/09/2020 shows    There are T2 hyperintense foci within the posterior spinal cord adjacent to C2 , anteriorly adjacent to C3, posteriorly adjacent to C3-C4 and anteriorly to the left adjacent to C4-C5.and multilevel DJD with mild spinal stenosis at C4C5 and C6C6 and significant foraminal narrowing at C3C4 and C5C6.     Potential for right C4, right C5, bilateral C6 and left C7 nerve root compression.    MRI of the brain 11/19/2021 shows T2/FLAIR hyperintense foci in the cerebellum, middle cerebellar peduncle, thalamus, upper spinal cord and in the cerebral hemispheres in a pattern and configuration consistent with chronic demyelinating plaque associated with multiple sclerosis. None of the foci appear to be acute. Compared to the MRI from 05/09/2020, there were no new lesions.   MRI of the cervical spine 11/19/2021 shows multiple T2 hyperintense foci within the spinal cord. These are located adjacent to C1-C2, adjacent to C2, anteriorly adjacent to C2-C3/anterocentrally adjacent to C3, posterolaterally to the right adjacent to C3-C4. Compared to the MRI dated 05/09/2020, there did not appear to be any new lesions. .   C4-C7 ACDF.  There is residual mild spinal stenosis at C6-C7 though there does not appear to be nerve root compression at any of these levels.  .   Mild degenerative changes at C2-C3 and C3-C4 that do not lead to any nerve root compression or spinal stenosis.  MRI of the brain 07/08/2023 showed an enhancing lesion in the periventricular white matter of the left frontal lobe.  This lesion was not present on the previous MRI from 2023.  Other lesions appeared unchanged.  MRI of the cervical spine 07/08/2023 showed a small new enhancing ventral spinal cord lesion at C7 not present on the previous MRI.  She has additional foci in the spinal  cord unchanged compared to the previous MRI.  MRI of the lumbar spine 11/17/2017 shows disc herniation towards the left causing severe lateral recess stenosis and probable left L5 nerve root compression.  At L5-S1, there is severe loss of disc height, disc bulging, endplate spurring and facet hypertrophy causing spinal stenosis and moderate left foraminal narrowing that could affect the left L5 nerve root.  REVIEW OF SYSTEMS: Constitutional: No fevers, chills, sweats, or change in appetite Eyes: No visual changes, double vision, eye pain Ear, nose and throat: No hearing loss, ear pain, nasal congestion, sore throat Cardiovascular: No chest pain, palpitations Respiratory:  No shortness of breath at rest or with exertion.   No wheezes GastrointestinaI: No nausea, vomiting, diarrhea, abdominal pain, fecal incontinence Genitourinary:  No dysuria, urinary retention or frequency.  No nocturia. Musculoskeletal:  No neck pain, back pain Integumentary: No rash, pruritus, skin lesions Neurological: as above  Psychiatric: No depression at this time.  No anxiety Endocrine: No palpitations, diaphoresis, change in appetite, change in weigh or increased thirst Hematologic/Lymphatic:  No anemia, purpura, petechiae. Allergic/Immunologic: No itchy/runny eyes, nasal congestion, recent allergic reactions, rashes  ALLERGIES: Allergies  Allergen Reactions   Penicillins Rash    HOME MEDICATIONS:  Current Outpatient Medications:    amLODipine  (NORVASC ) 5 MG tablet, Take 5 mg by mouth daily., Disp: , Rfl:    Armodafinil  150 MG tablet, 1/2 tablet to 1 tablet po qAM as needed (Patient taking differently: Take 0.5 tablets by mouth daily as needed (daytime sleepiness).), Disp: 90 tablet, Rfl: 1   atorvastatin  (LIPITOR) 10 MG tablet, Take 10 mg by mouth daily., Disp: , Rfl:    baclofen  (LIORESAL ) 10 MG tablet, Take 1 tablet (10 mg total) by mouth 2 (two) times daily., Disp: 60 tablet, Rfl: 11   buPROPion  (WELLBUTRIN   XL) 150 MG 24 hr tablet, Take 150 mg by mouth every morning., Disp: , Rfl:    famotidine  (PEPCID ) 20 MG tablet, Take 1 tablet (20 mg total) by mouth 2 (two) times daily., Disp: 30 tablet, Rfl: 3   fluticasone  (FLONASE ) 50 MCG/ACT nasal spray, Place 2 sprays into both nostrils 2 (two) times daily., Disp: 16 g, Rfl: 6   gabapentin  (NEURONTIN ) 300 MG capsule, ONE BY MOUTH EVERY MORNING AND BY MOUTH EVERY EVENING AND TWO BY MOUTH AT BEDTIME, Disp: 120 capsule, Rfl: 2   hydrOXYzine (ATARAX) 10 MG tablet, Take 10 mg by mouth as needed., Disp: , Rfl:    ibuprofen  (ADVIL ,MOTRIN ) 200 MG tablet, Take 400 mg by mouth every 6 (six) hours as needed., Disp: , Rfl:    losartan  (COZAAR ) 100 MG tablet, Take 100 mg by mouth daily., Disp: , Rfl:    meloxicam  (MOBIC ) 15 MG tablet, TAKE 1 TABLET (15 MG TOTAL) BY MOUTH DAILY., Disp: 30 tablet, Rfl: 3   pantoprazole  (PROTONIX ) 40 MG tablet, Take 1 tablet (40 mg total) by mouth 2 (two) times daily., Disp: 60 tablet, Rfl: 0   pramipexole  (MIRAPEX ) 0.25 MG tablet, Take one or two po qHS, Disp: 180 tablet, Rfl: 3   Pramipexole  Dihydrochloride 0.375 MG TB24, TAKE 1 TABLET (0.375 MG TOTAL) BY MOUTH AT BEDTIME., Disp: 90 tablet, Rfl: 3   prednisoLONE  acetate (PRED FORTE ) 1 % ophthalmic suspension, As needed, Disp: , Rfl:    traMADol  (ULTRAM ) 50 MG tablet, Take 1 tablet (50 mg total) by mouth every 8 (eight) hours as needed., Disp: 90 tablet, Rfl: 2  PAST MEDICAL HISTORY: Past Medical History:  Diagnosis Date   Asthma    Back pain    Cardiac/pericardial tamponade 06/11/2014   s/p pericardial window 06/16/2014 per Dr. Obadiah   Depression    Hypertension    MS (multiple sclerosis) (HCC)    Muscle pain    Obesity    Periodic limb movement disorder (PLMD) 07/15/2016   Pleural effusion, bilateral 06/30/2014   Uveitis    Bilateral    PAST SURGICAL HISTORY: Past Surgical History:  Procedure Laterality Date   CERVICAL FUSION     01-02-21, [place] C5-7    microdisectomy  2019   lumbar spine   PERICARDIAL TAP N/A 06/11/2014   Procedure: PERICARDIAL TAP;  Surgeon: Ozell JONETTA Fell, MD;  [facility] C[facility] LAB;  Service: Cardiovascular;  Laterality: N/A;   SUBXYPHOID PERICARDIAL WINDOW N/A 06/16/2014   Procedure: SUBXYPHOID PERICARDIAL WINDOW;  Surgeon: Maude Fleeta Ochoa, MD;  Location: [facility] OR;  Service: Thoracic;  Laterality: N/A;  TEE WITHOUT CARDIOVERSION N/A 06/16/2014   Procedure: TRANSESOPHAGEAL ECHOCARDIOGRAM (TEE);  Surgeon: Maude Fleeta Ochoa, MD;  Location: [facility] OR;  Service: Thoracic;  Laterality: N/A;   uterine ablasion  2012    FAMILY HISTORY: Family History  Problem Relation Age of Onset   Breast cancer Mother    Hypertension Mother    Diabetes Mother    Heart attack Father    Epilepsy Father    Endocrine tumor Brother    Multiple sclerosis Neg Hx     SOCIAL HISTORY:  Social History   Socioeconomic History   Marital status: Married    Spouse name: Not on file   Number of children: 0   Years of education: MA   Highest education level: Not on file  Occupational History   Occupation: [employer]  Tobacco Use   Smoking status: Never   Smokeless tobacco: Never  Vaping Use   Vaping status: Some Days  Substance and Sexual Activity   Alcohol use: Yes    Alcohol/week: 3.0 standard drinks of alcohol    Types: 3 Standard drinks or equivalent per week    Comment: occasional   Drug use: Yes    Comment: marijuana   Sexual activity: Not Currently  Other Topics Concern   Not on file  Social History Narrative   Patient lives at home with her wife Sherrie)    Patient works full time.   Financial controller.   Right handed.   Caffeine two cups daily.   Social Drivers of Corporate investment banker Strain: Not on file  Food Insecurity: No Food Insecurity (07/10/2023)   Hunger Vital Sign    Worried About Running Out of Food in the Last Year: Never true    Ran Out of Food in the Last Year: Never true   Transportation Needs: No Transportation Needs (07/10/2023)   PRAPARE - Administrator, Civil Service (Medical): No    Lack of Transportation (Non-Medical): No  Physical Activity: Not on file  Stress: Not on file  Social Connections: Not on file  Intimate Partner Violence: Not At Risk (07/10/2023)   Humiliation, Afraid, Rape, and Kick questionnaire    Fear of Current or Ex-Partner: No    Emotionally Abused: No    Physically Abused: No    Sexually Abused: No     PHYSICAL EXAM  Vitals:   04/25/24 1400  BP: 122/83  Pulse: 67  SpO2: 98%  Weight: 280 lb 8 oz (127.2 kg)  Height: 5' 9 (1.753 m)    Body mass index is 41.42 kg/m.   General: The patient is well-developed and well-nourished and in no acute distress  HEENT:  Head is [place]/AT.  Sclera are anicteric.    Mallampati 2.   16 inch neck circumference  Neck:   The neck is nontender.  Skin: Extremities are without rash or  edema.  Musculoskeletal:  Back is nontender  Neurologic Exam  Mental status: The patient is alert and oriented x 3 at the time of the examination. The patient has apparent normal recent and remote memory, with an apparently normal attention span and concentration ability.   Speech is normal.  Cranial nerves: Extraocular movements are full.  Facial strength and sensation was normal.  Trapezius strength was normal.   No obvious hearing deficits are noted.  Motor:  Muscle bulk is normal.   Tone is normal. Strength is  5 / 5 in all 4 extremities.   Sensory: Sensory testing  is intact to pinprick, soft touch and vibration sensation in all 4 extremities.  Coordination: Cerebellar testing reveals good finger-nose-finger and heel-to-shin bilaterally.  Gait and station: Station is normal.  The gait is mildly wide and the tandem gait is poor.  She does not have a Romberg sign..  Reflexes: Deep tendon reflexes are symmetric and normal in the arms and increase in the legs with spreasd at knees.   However, there is no ankle clonus.SABRA       DIAGNOSTIC DATA (LABS, IMAGING, TESTING) - I reviewed patient records, labs, notes, testing and imaging myself where available.  Lab Results  Component Value Date   WBC 4.0 02/01/2024   HGB 12.7 02/01/2024   HCT 37.3 02/01/2024   MCV 90.1 02/01/2024   PLT 179 02/01/2024      Component Value Date/Time   NA 137 02/01/2024 1040   NA 137 11/30/2023 1550   K 4.4 02/01/2024 1040   CL 101 02/01/2024 1040   CO2 24 02/01/2024 1040   GLUCOSE 91 02/01/2024 1040   BUN 15 02/01/2024 1040   BUN 16 11/30/2023 1550   CREATININE 0.81 02/01/2024 1040   CREATININE 0.76 06/27/2014 0915   CALCIUM  9.4 02/01/2024 1040   PROT 6.8 02/01/2024 1040   PROT 6.8 11/30/2023 1550   ALBUMIN 4.1 02/01/2024 1040   ALBUMIN 4.2 11/30/2023 1550   AST 19 02/01/2024 1040   ALT 13 02/01/2024 1040   ALKPHOS 112 02/01/2024 1040   BILITOT 0.4 02/01/2024 1040   BILITOT 0.3 11/30/2023 1550   GFRNONAA >60 02/01/2024 1040   GFRAA 99 04/24/2020 0915    Lab Results  Component Value Date   VITAMINB12 479 04/24/2020   Lab Results  Component Value Date   TSH 1.160 02/01/2024       ASSESSMENT AND PLAN  Multiple sclerosis (HCC) - Plan: CBC with Differential/Platelet, Hepatic function panel, Hepatitis B surface antigen, HIV Antibody (routine testing w rflx), QuantiFERON-TB Gold Plus, Hepatitis B core antibody, total  High risk medication use - Plan: CBC with Differential/Platelet, Hepatic function panel, Hepatitis B surface antigen, HIV Antibody (routine testing w rflx), QuantiFERON-TB Gold Plus, Hepatitis B core antibody, total  Lumbosacral radiculopathy at S1 - Plan: DG INJECT DIAG/THERA/INC NEEDLE/CATH/PLC EPI/LUMB/SAC W/IMG  Gait disorder  Urge incontinence   She had an exacerbation with MRI changes while on Ocrevus  in 2024.  She initiated [facility] February 2025.  She will have a second year in early 2026.  We will check some lab work today.    Worsening leg  pain is unlikely to be MS related and more likely to be due to lumbar degenerative changes she has progressive changes of the lumbar spine seen on the MRI from a couple months ago compared to 2019.  We will send her for an L5-S1 ESI for left S1 radiculopathy She has CPAP for OSA but has not been using it much.  We discussed the importance of trying to use it every night.  She also has restless leg syndrome\.  She will continue the nighttime gabapentin  to 600 mg (takes 300 - 300 - 600), continue pramipexole .  Stay active and exercise as tolerated.   Rtc 6 months  40-minute office visit with the majority of the time spent face-to-face for history and physical, discussion/counseling and decision-making.  Additional time with record review and documentation.   Bensen Chadderdon A. Vear, MD, [facility] 04/25/2024, 4:21 PM Certified in Neurology, Clinical Neurophysiology, Sleep Medicine and Neuroimaging  [facility] [address] [place], [place] [address] [phone]

## 2024-04-27 ENCOUNTER — Ambulatory Visit: Payer: Self-pay | Admitting: Neurology

## 2024-04-27 LAB — CBC WITH DIFFERENTIAL/PLATELET
Basophils Absolute: 0 x10E3/uL (ref 0.0–0.2)
Basos: 1 %
EOS (ABSOLUTE): 0.1 x10E3/uL (ref 0.0–0.4)
Eos: 2 %
Hematocrit: 36.7 % (ref 34.0–46.6)
Hemoglobin: 12.3 g/dL (ref 11.1–15.9)
Immature Grans (Abs): 0 x10E3/uL (ref 0.0–0.1)
Immature Granulocytes: 0 %
Lymphocytes Absolute: 1.1 x10E3/uL (ref 0.7–3.1)
Lymphs: 20 %
MCH: 31.4 pg (ref 26.6–33.0)
MCHC: 33.5 g/dL (ref 31.5–35.7)
MCV: 94 fL (ref 79–97)
Monocytes Absolute: 0.5 x10E3/uL (ref 0.1–0.9)
Monocytes: 9 %
Neutrophils Absolute: 3.5 x10E3/uL (ref 1.4–7.0)
Neutrophils: 68 %
Platelets: 190 x10E3/uL (ref 150–450)
RBC: 3.92 x10E6/uL (ref 3.77–5.28)
RDW: 12.7 % (ref 11.7–15.4)
WBC: 5.1 x10E3/uL (ref 3.4–10.8)

## 2024-04-27 LAB — QUANTIFERON-TB GOLD PLUS
QuantiFERON Mitogen Value: 0.71 [IU]/mL
QuantiFERON Nil Value: 0.06 [IU]/mL
QuantiFERON TB1 Ag Value: 0.08 [IU]/mL
QuantiFERON TB2 Ag Value: 0.12 [IU]/mL
QuantiFERON-TB Gold Plus: NEGATIVE

## 2024-04-27 LAB — HEPATIC FUNCTION PANEL
ALT: 13 IU/L (ref 0–32)
AST: 17 IU/L (ref 0–40)
Albumin: 4.2 g/dL (ref 3.9–4.9)
Alkaline Phosphatase: 126 IU/L (ref 49–135)
Bilirubin Total: 0.2 mg/dL (ref 0.0–1.2)
Bilirubin, Direct: 0.1 mg/dL (ref 0.00–0.40)
Total Protein: 6.6 g/dL (ref 6.0–8.5)

## 2024-04-27 LAB — HEPATITIS B CORE ANTIBODY, TOTAL: Hep B Core Total Ab: NEGATIVE

## 2024-04-27 LAB — HIV ANTIBODY (ROUTINE TESTING W REFLEX): HIV Screen 4th Generation wRfx: NONREACTIVE

## 2024-04-27 LAB — HEPATITIS B SURFACE ANTIGEN: Hepatitis B Surface Ag: NEGATIVE

## 2024-04-28 NOTE — Progress Notes (Signed)
 Start form in Dr. Duncan box in pod for him to sign once back in office.

## 2024-05-03 ENCOUNTER — Other Ambulatory Visit (HOSPITAL_BASED_OUTPATIENT_CLINIC_OR_DEPARTMENT_OTHER): Payer: Self-pay | Admitting: Internal Medicine

## 2024-05-03 ENCOUNTER — Other Ambulatory Visit (HOSPITAL_COMMUNITY): Payer: Self-pay | Admitting: Internal Medicine

## 2024-05-03 DIAGNOSIS — Z0000 Encounter for general adult medical examination without abnormal findings: Secondary | ICD-10-CM | POA: Diagnosis not present

## 2024-05-03 DIAGNOSIS — Z8679 Personal history of other diseases of the circulatory system: Secondary | ICD-10-CM

## 2024-05-03 DIAGNOSIS — E559 Vitamin D deficiency, unspecified: Secondary | ICD-10-CM | POA: Diagnosis not present

## 2024-05-03 DIAGNOSIS — E538 Deficiency of other specified B group vitamins: Secondary | ICD-10-CM | POA: Diagnosis not present

## 2024-05-03 DIAGNOSIS — Z23 Encounter for immunization: Secondary | ICD-10-CM | POA: Diagnosis not present

## 2024-05-03 DIAGNOSIS — I10 Essential (primary) hypertension: Secondary | ICD-10-CM | POA: Diagnosis not present

## 2024-05-03 DIAGNOSIS — E611 Iron deficiency: Secondary | ICD-10-CM | POA: Diagnosis not present

## 2024-05-03 DIAGNOSIS — H2013 Chronic iridocyclitis, bilateral: Secondary | ICD-10-CM | POA: Diagnosis not present

## 2024-05-03 DIAGNOSIS — E785 Hyperlipidemia, unspecified: Secondary | ICD-10-CM | POA: Diagnosis not present

## 2024-05-03 DIAGNOSIS — G35 Multiple sclerosis: Secondary | ICD-10-CM | POA: Diagnosis not present

## 2024-05-04 ENCOUNTER — Ambulatory Visit
Admission: RE | Admit: 2024-05-04 | Discharge: 2024-05-04 | Disposition: A | Discharge status: Home / Self Care | Source: Ambulatory Visit | Attending: Neurology | Admitting: Neurology

## 2024-05-04 DIAGNOSIS — M5416 Radiculopathy, lumbar region: Secondary | ICD-10-CM | POA: Diagnosis not present

## 2024-05-04 DIAGNOSIS — M5417 Radiculopathy, lumbosacral region: Secondary | ICD-10-CM

## 2024-05-04 MED ORDER — METHYLPREDNISOLONE ACETATE 40 MG/ML INJ SUSP (RADIOLOG
80.0000 mg | Freq: Once | INTRAMUSCULAR | Status: AC
  Administered 2024-05-04: 80 mg via EPIDURAL

## 2024-05-04 MED ORDER — IOPAMIDOL (ISOVUE-M 200) INJECTION 41%
1.0000 mL | Freq: Once | INTRAMUSCULAR | Status: AC
  Administered 2024-05-04: 1 mL via EPIDURAL

## 2024-05-04 NOTE — Discharge Instructions (Signed)

## 2024-05-11 ENCOUNTER — Telehealth: Payer: Self-pay | Admitting: *Deleted

## 2024-05-11 NOTE — Telephone Encounter (Signed)
 Due January of 2026-- will need to wait closer to then to complete PA.

## 2024-05-11 NOTE — Telephone Encounter (Signed)
Faxed completed/signed [employer]avenclad start form to [employer] at [phone]. Received fax confirmation.

## 2024-05-29 ENCOUNTER — Other Ambulatory Visit: Payer: Self-pay | Admitting: Neurology

## 2024-05-30 ENCOUNTER — Ambulatory Visit (HOSPITAL_BASED_OUTPATIENT_CLINIC_OR_DEPARTMENT_OTHER)
Admission: RE | Admit: 2024-05-30 | Discharge: 2024-05-30 | Disposition: A | Discharge status: Home / Self Care | Payer: Self-pay | Source: Ambulatory Visit | Attending: Internal Medicine | Admitting: Internal Medicine

## 2024-05-30 DIAGNOSIS — E785 Hyperlipidemia, unspecified: Secondary | ICD-10-CM | POA: Insufficient documentation

## 2024-05-30 NOTE — Telephone Encounter (Signed)
 Last seen on 04/25/24 Follow up scheduled on 07/05/24

## 2024-06-10 ENCOUNTER — Ambulatory Visit (HOSPITAL_COMMUNITY)
Admission: RE | Admit: 2024-06-10 | Discharge: 2024-06-10 | Disposition: A | Discharge status: Home / Self Care | Source: Ambulatory Visit | Attending: Internal Medicine | Admitting: Internal Medicine

## 2024-06-10 DIAGNOSIS — Z8679 Personal history of other diseases of the circulatory system: Secondary | ICD-10-CM | POA: Insufficient documentation

## 2024-06-10 LAB — ECHOCARDIOGRAM COMPLETE
AR max vel: 2.56 cm2
AV Area VTI: 2.58 cm2
AV Area mean vel: 2.55 cm2
AV Mean grad: 10.5 mmHg
AV Peak grad: 20.9 mmHg
Ao pk vel: 2.29 m/s
Area-P 1/2: 2.2 cm2
S' Lateral: 2 cm

## 2024-06-16 NOTE — Telephone Encounter (Signed)
 Completed PA for Mavenclad (10 tablets) via CMM. Sent to [facility]. Should have a determination within 1-3 business days. Key: [id number].

## 2024-06-21 NOTE — Telephone Encounter (Signed)
 PA for [facility] was approved. Likely will need to renew this PA since it expires prior to January 2026.

## 2024-06-27 DIAGNOSIS — M67442 Ganglion, left hand: Secondary | ICD-10-CM | POA: Diagnosis not present

## 2024-06-28 DIAGNOSIS — H5203 Hypermetropia, bilateral: Secondary | ICD-10-CM | POA: Diagnosis not present

## 2024-06-28 DIAGNOSIS — H43813 Vitreous degeneration, bilateral: Secondary | ICD-10-CM | POA: Diagnosis not present

## 2024-06-28 DIAGNOSIS — H26491 Other secondary cataract, right eye: Secondary | ICD-10-CM | POA: Diagnosis not present

## 2024-06-28 DIAGNOSIS — Z961 Presence of intraocular lens: Secondary | ICD-10-CM | POA: Diagnosis not present

## 2024-07-04 NOTE — Telephone Encounter (Signed)
 Received this from [facility]:

## 2024-07-05 ENCOUNTER — Encounter: Payer: Self-pay | Admitting: Neurology

## 2024-07-05 ENCOUNTER — Ambulatory Visit: Admitting: Neurology

## 2024-07-05 VITALS — BP 139/86 | HR 57 | Ht 69.0 in | Wt 271.5 lb

## 2024-07-05 DIAGNOSIS — G35A Relapsing-remitting multiple sclerosis: Secondary | ICD-10-CM

## 2024-07-05 DIAGNOSIS — R4189 Other symptoms and signs involving cognitive functions and awareness: Secondary | ICD-10-CM

## 2024-07-05 DIAGNOSIS — G2581 Restless legs syndrome: Secondary | ICD-10-CM

## 2024-07-05 DIAGNOSIS — Z79899 Other long term (current) drug therapy: Secondary | ICD-10-CM | POA: Diagnosis not present

## 2024-07-05 DIAGNOSIS — R269 Unspecified abnormalities of gait and mobility: Secondary | ICD-10-CM | POA: Diagnosis not present

## 2024-07-05 MED ORDER — MODAFINIL 200 MG PO TABS: ORAL_TABLET | ORAL | 1 refills | Status: AC

## 2024-07-05 NOTE — Progress Notes (Addendum)
 [facility]  PATIENT: [name] DOB: 03-Feb-1961  REFERRING DOCTOR OR PCP:  Dr. Elliot SOURCE: Patient, note from Dr. Jenel and Duwaine Russell, NP, laboratory and imaging reports, MRI images personally reviewed.  _________________________________   HISTORICAL  CHIEF COMPLAINT:  Chief Complaint  Patient presents with   Multiple Sclerosis    RM10, WIFE PRESENT, MS FOLLOW UP     HISTORY OF PRESENT ILLNESS:  [name] is a 63 y.o. woman with multiple sclerosis.  Update 07/05/2024 She did the [facility] Feb and March 2025.  The lymphocyte count was 0.4 on 11/30/2023 and 0.7 on 02/01/2024.  She will be up for her second year of Mavenclad January or February 2026.  She tolerated the Mavenclad well.  She denies any MS exacerbations or significant new symptoms.  She generally feels stable but notes more fatigue some days.    Before the exacerbation in November 2024, she had gone 10 years without another exacerbation..    She has OSA and is on auto CPAP.  She has trouble using it many nights at the last visit, d/l shows good efficacy (AHI=3.7) but poor compliance 3%. She uses a Chief Operating Officer or similar mask - it is comfortable enough but she takes it off since she moves around a lot and does not always place it back.  She has trouble getting comfortable with it. \  RLS and leg pain bothers her more at night the past year, espe the last month.  Pramipexole  0.375 CR plus 2 x 0.25 IR at night.   She is on gabapentin  300 mg (1-1-2)    Armodafinil  helps sleepiness.but sometimes makes her have more trouble sleeping at night if she takes after 8 am.    She is able to walk without a cane or walker but notes doing worse when she goes longer distance.  Stairs are difficult.  She does not keep up with others.  Her left side is weaker than her right and she has some left leg spasticity.   She has urinary urgency and rare urge incontinence.   She has not been placed on any  medication for this.    Vision is stable.  She has had chronic left  uveitis.    Just mild reduced acuity and colors not desaturated.    She notes fatigue, cognitive and physical.   She takes armodafinil  1/2 pill 1-2 times a week.  She feels cognition worsens as day goes on.   She is working as a it trainer and has noted more issues doing her job as well.  We discussed considering neurocognitive testing to further assess the cognitive function and get a baseline for the future.  She had sciatic type symptoms and had ESI - back pain is better but leg pain hurt more since.      She had lumbar surgery (L4-L5 microdiskectomy) around 2018.     She has seen Dr. Alm Molt.   She had cervical ACDF 12/2019 C5-C7.     She continues to have left neck pain and pain down the left arm.       The MRI of the lumbar spine 03/01/2024 showed At L2-L3, there are degenerative changes causing mild spinal stenosis that have progressed compared to the MRI from 11/17/2017.  There is no nerve root compression. At L3-L4, there are degenerative changes causing moderate spinal stenosis and moderately severe right lateral recess stenosis with potential for right L4 nerve root compression.  Degenerative changes have progressed compared to the  2019 MRI. At L4-L5, there are degenerative changes causing mild spinal stenosis but no nerve root compression.  The disc herniation noted in 2019 is resolved by the microdiscectomy.  There is no epidural fibrosis. At L5-S1, there are degenerative changes causing left greater than right foraminal and lateral recess stenosis with potential for left L5 and S1 nerve root compression.  The degenerative has had progressed compared to the 2019 MRI. Normal enhancement pattern.      MS History: She was diagnosed in 2000 after presenting with unilateral numbness.   She ws started on Avonex. .   In retrospect, she also had left optic neuritis in 1996.     She switched from Avonex to Rebif due  to breakthrough activity.   She was switched to Tysabri  due to breakthrough activity.   She became JCV Ab positive and switched to Ocrevus  in 2022.      She had a relapse with 2 ehancing lesions on MRI in Novemebr 2024 and will be switched to [facility]  Imaging: MRI Brain 05/09/2020 shows   Multiple T2/FLAIR hyperintense foci in the cerebellum, brainstem, thalamus and hemispheres in a pattern and configuration consistent with chronic demyelinating plaque associated with multiple sclerosis. None of the foci appears to be acute.  No changes compared to 06/28/2019  MRI cervical spine 05/09/2020 shows    There are T2 hyperintense foci within the posterior spinal cord adjacent to C2 , anteriorly adjacent to C3, posteriorly adjacent to C3-C4 and anteriorly to the left adjacent to C4-C5.and multilevel DJD with mild spinal stenosis at C4C5 and C6C6 and significant foraminal narrowing at C3C4 and C5C6.     Potential for right C4, right C5, bilateral C6 and left C7 nerve root compression.    MRI of the brain 11/19/2021 shows T2/FLAIR hyperintense foci in the cerebellum, middle cerebellar peduncle, thalamus, upper spinal cord and in the cerebral hemispheres in a pattern and configuration consistent with chronic demyelinating plaque associated with multiple sclerosis. None of the foci appear to be acute. Compared to the MRI from 05/09/2020, there were no new lesions.   MRI of the cervical spine 11/19/2021 shows multiple T2 hyperintense foci within the spinal cord. These are located adjacent to C1-C2, adjacent to C2, anteriorly adjacent to C2-C3/anterocentrally adjacent to C3, posterolaterally to the right adjacent to C3-C4. Compared to the MRI dated 05/09/2020, there did not appear to be any new lesions. .   C4-C7 ACDF.  There is residual mild spinal stenosis at C6-C7 though there does not appear to be nerve root compression at any of these levels.  .   Mild degenerative changes at C2-C3 and C3-C4 that do not lead to any  nerve root compression or spinal stenosis.  MRI of the brain 07/08/2023 showed an enhancing lesion in the periventricular white matter of the left frontal lobe.  This lesion was not present on the previous MRI from 2023.  Other lesions appeared unchanged.  MRI of the cervical spine 07/08/2023 showed a small new enhancing ventral spinal cord lesion at C7 not present on the previous MRI.  She has additional foci in the spinal cord unchanged compared to the previous MRI.  MRI of the lumbar spine 11/17/2017 shows disc herniation towards the left causing severe lateral recess stenosis and probable left L5 nerve root compression.  At L5-S1, there is severe loss of disc height, disc bulging, endplate spurring and facet hypertrophy causing spinal stenosis and moderate left foraminal narrowing that could affect the left L5 nerve root.  REVIEW OF SYSTEMS: Constitutional: No fevers, chills, sweats, or change in appetite Eyes: No visual changes, double vision, eye pain Ear, nose and throat: No hearing loss, ear pain, nasal congestion, sore throat Cardiovascular: No chest pain, palpitations Respiratory:  No shortness of breath at rest or with exertion.   No wheezes GastrointestinaI: No nausea, vomiting, diarrhea, abdominal pain, fecal incontinence Genitourinary:  No dysuria, urinary retention or frequency.  No nocturia. Musculoskeletal:  No neck pain, back pain Integumentary: No rash, pruritus, skin lesions Neurological: as above Psychiatric: No depression at this time.  No anxiety Endocrine: No palpitations, diaphoresis, change in appetite, change in weigh or increased thirst Hematologic/Lymphatic:  No anemia, purpura, petechiae. Allergic/Immunologic: No itchy/runny eyes, nasal congestion, recent allergic reactions, rashes  ALLERGIES: Allergies  Allergen Reactions   Penicillins Rash and Dermatitis    HOME MEDICATIONS:  Current Outpatient Medications:    amLODipine  (NORVASC ) 5 MG tablet, Take 5 mg  by mouth daily., Disp: , Rfl:    Armodafinil  150 MG tablet, 1/2 tablet to 1 tablet po qAM as needed (Patient taking differently: Take 0.5 tablets by mouth daily as needed (daytime sleepiness).), Disp: 90 tablet, Rfl: 1   atorvastatin  (LIPITOR) 10 MG tablet, Take 10 mg by mouth daily., Disp: , Rfl:    baclofen  (LIORESAL ) 10 MG tablet, Take 1 tablet (10 mg total) by mouth 2 (two) times daily., Disp: 60 tablet, Rfl: 11   buPROPion  (WELLBUTRIN  XL) 150 MG 24 hr tablet, Take 150 mg by mouth every morning., Disp: , Rfl:    famotidine  (PEPCID ) 20 MG tablet, Take 1 tablet (20 mg total) by mouth 2 (two) times daily., Disp: 30 tablet, Rfl: 3   fluticasone  (FLONASE ) 50 MCG/ACT nasal spray, Place 2 sprays into both nostrils 2 (two) times daily., Disp: 16 g, Rfl: 6   gabapentin  (NEURONTIN ) 300 MG capsule, ONE BY MOUTH EVERY MORNING AND BY MOUTH EVERY EVENING AND TWO BY MOUTH AT BEDTIME, Disp: 120 capsule, Rfl: 1   hydrOXYzine (ATARAX) 10 MG tablet, Take 10 mg by mouth as needed., Disp: , Rfl:    ibuprofen  (ADVIL ,MOTRIN ) 200 MG tablet, Take 400 mg by mouth every 6 (six) hours as needed., Disp: , Rfl:    losartan  (COZAAR ) 100 MG tablet, Take 100 mg by mouth daily., Disp: , Rfl:    meloxicam  (MOBIC ) 15 MG tablet, TAKE 1 TABLET (15 MG TOTAL) BY MOUTH DAILY., Disp: 30 tablet, Rfl: 3   modafinil  (PROVIGIL ) 200 MG tablet, 1/2 to 1 po qAM, Disp: 90 tablet, Rfl: 1   pantoprazole  (PROTONIX ) 40 MG tablet, Take 1 tablet (40 mg total) by mouth 2 (two) times daily., Disp: 60 tablet, Rfl: 0   pramipexole  (MIRAPEX ) 0.25 MG tablet, Take one or two po qHS, Disp: 180 tablet, Rfl: 3   Pramipexole  Dihydrochloride 0.375 MG TB24, TAKE 1 TABLET (0.375 MG TOTAL) BY MOUTH AT BEDTIME., Disp: 90 tablet, Rfl: 3   prednisoLONE  acetate (PRED FORTE ) 1 % ophthalmic suspension, As needed, Disp: , Rfl:   PAST MEDICAL HISTORY: Past Medical History:  Diagnosis Date   Asthma    Back pain    Cardiac/pericardial tamponade 06/11/2014   s/p  pericardial window 06/16/2014 per Dr. Obadiah   Depression    Hypertension    MS (multiple sclerosis)    Muscle pain    Obesity    Periodic limb movement disorder (PLMD) 07/15/2016   Pleural effusion, bilateral 06/30/2014   Uveitis    Bilateral    PAST SURGICAL HISTORY: Past Surgical History:  Procedure Laterality Date   CERVICAL FUSION     01-02-21, [place] C5-7   microdisectomy  2019   lumbar spine   PERICARDIAL TAP N/A 06/11/2014   Procedure: PERICARDIAL TAP;  Surgeon: Ozell JONETTA Fell, MD;  Location: [facility] CATH LAB;  Service: Cardiovascular;  Laterality: N/A;   SUBXYPHOID PERICARDIAL WINDOW N/A 06/16/2014   Procedure: SUBXYPHOID PERICARDIAL WINDOW;  Surgeon: Maude Fleeta Ochoa, MD;  Location: [facility] OR;  Service: Thoracic;  Laterality: N/A;   TEE WITHOUT CARDIOVERSION N/A 06/16/2014   Procedure: TRANSESOPHAGEAL ECHOCARDIOGRAM (TEE);  Surgeon: Maude Fleeta Ochoa, MD;  Location: [facility] OR;  Service: Thoracic;  Laterality: N/A;   uterine ablasion  2012    FAMILY HISTORY: Family History  Problem Relation Age of Onset   Breast cancer Mother    Hypertension Mother    Diabetes Mother    Heart attack Father    Epilepsy Father    Endocrine tumor Brother    Multiple sclerosis Neg Hx     SOCIAL HISTORY:  Social History   Socioeconomic History   Marital status: Married    Spouse name: Not on file   Number of children: 0   Years of education: MA   Highest education level: Not on file  Occupational History   Occupation: [employer]  Tobacco Use   Smoking status: Never   Smokeless tobacco: Never  Vaping Use   Vaping status: Some Days  Substance and Sexual Activity   Alcohol use: Yes    Alcohol/week: 3.0 standard drinks of alcohol    Types: 3 Standard drinks or equivalent per week    Comment: occasional   Drug use: Yes    Comment: marijuana   Sexual activity: Not Currently  Other Topics Concern   Not on file  Social History Narrative   Patient lives at home  with her wife Sherrie)    Patient works full time.   Financial controller.   Right handed.   Caffeine two cups daily.   Social Drivers of Corporate Investment Banker Strain: Not on file  Food Insecurity: No Food Insecurity (07/10/2023)   Hunger Vital Sign    Worried About Running Out of Food in the Last Year: Never true    Ran Out of Food in the Last Year: Never true  Transportation Needs: No Transportation Needs (07/10/2023)   PRAPARE - Administrator, Civil Service (Medical): No    Lack of Transportation (Non-Medical): No  Physical Activity: Not on file  Stress: Not on file  Social Connections: Not on file  Intimate Partner Violence: Not At Risk (07/10/2023)   Humiliation, Afraid, Rape, and Kick questionnaire    Fear of Current or Ex-Partner: No    Emotionally Abused: No    Physically Abused: No    Sexually Abused: No     PHYSICAL EXAM  Vitals:   07/05/24 1552  BP: 139/86  Pulse: (!) 57  Weight: 271 lb 8 oz (123.2 kg)  Height: 5' 9 (1.753 m)    Body mass index is 40.09 kg/m.   General: The patient is well-developed and well-nourished and in no acute distress  HEENT:  Head is [place]/AT.  Sclera are anicteric.    Mallampati 2.   16 inch neck circumference  Neck:   The neck is nontender.  Skin: Extremities are without rash or  edema.  Musculoskeletal:  Back is nontender  Neurologic Exam  Mental status: The patient is alert and oriented x 3 at the  time of the examination. The patient has apparent normal recent and remote memory, with an apparently normal attention span and concentration ability.   Speech is normal.  Cranial nerves: Extraocular movements are full.  Facial strength and sensation was normal.  Trapezius strength was normal.   No obvious hearing deficits are noted.  Motor:  Muscle bulk is normal.   Tone is normal. Strength is  5 / 5 in all 4 extremities.   Sensory: Sensory testing is intact to pinprick, soft touch and vibration sensation in all  4 extremities.  Coordination: Cerebellar testing reveals good finger-nose-finger and heel-to-shin bilaterally.  Gait and station: Station is normal.  The gait is mildly wide.  Tandem gait is poor.  She does not have a Romberg sign..  Reflexes: Deep tendon reflexes are symmetric and normal in the arms and increase in the legs with spreasd at knees.  However, there is no ankle clonus.SABRA       DIAGNOSTIC DATA (LABS, IMAGING, TESTING) - I reviewed patient records, labs, notes, testing and imaging myself where available.  Lab Results  Component Value Date   WBC 5.1 04/25/2024   HGB 12.3 04/25/2024   HCT 36.7 04/25/2024   MCV 94 04/25/2024   PLT 190 04/25/2024      Component Value Date/Time   NA 137 02/01/2024 1040   NA 137 11/30/2023 1550   K 4.4 02/01/2024 1040   CL 101 02/01/2024 1040   CO2 24 02/01/2024 1040   GLUCOSE 91 02/01/2024 1040   BUN 15 02/01/2024 1040   BUN 16 11/30/2023 1550   CREATININE 0.81 02/01/2024 1040   CREATININE 0.76 06/27/2014 0915   CALCIUM  9.4 02/01/2024 1040   PROT 6.6 04/25/2024 1504   ALBUMIN 4.2 04/25/2024 1504   AST 17 04/25/2024 1504   ALT 13 04/25/2024 1504   ALKPHOS 126 04/25/2024 1504   BILITOT 0.2 04/25/2024 1504   GFRNONAA >60 02/01/2024 1040   GFRAA 99 04/24/2020 0915    Lab Results  Component Value Date   VITAMINB12 479 04/24/2020   Lab Results  Component Value Date   TSH 1.160 02/01/2024       ASSESSMENT AND PLAN  Multiple sclerosis, relapsing-remitting - Plan: Hepatitis B surface antigen, HIV Antibody (routine testing w rflx), QuantiFERON-TB Gold Plus, Hepatitis B core antibody, total, Hepatic function panel, CBC with Differential/Platelet  High risk medication use - Plan: Hepatitis B surface antigen, HIV Antibody (routine testing w rflx), QuantiFERON-TB Gold Plus, Hepatitis B core antibody, total, Hepatic function panel, CBC with Differential/Platelet  Gait disorder  RLS (restless legs syndrome)  Disturbed  cognition   She initiated [facility] February 2025.  She will have a second year in early 2026.  We will check lab work today for chronic infections..    She has auto PAP for OSA but has not been using it much.  We discussed the importance of trying to use it every night.  She also has restless leg syndrome.  She might benefit from a different type of mask.  She will continue the nighttime gabapentin  to 600 mg (takes 300 - 300 - 600), continue pramipexole  for RLS Stay active and exercise as tolerated.   Rtc 6 months   Shaasia Odle A. Vear, MD, [facility] 07/05/2024, 7:25 PM Certified in Neurology, Clinical Neurophysiology, Sleep Medicine and Neuroimaging  [facility] [address] [place], [place] [address] [phone]

## 2024-07-05 NOTE — Telephone Encounter (Signed)
 Patient is cleared to start therapy in January 2026.  I have reached out to Javie at  [facility] for confirmation.

## 2024-07-05 NOTE — Addendum Note (Signed)
 Addended by: VEAR CHARLIE LABOR on: 07/05/2024 07:27 PM   Modules accepted: Orders

## 2024-07-06 ENCOUNTER — Telehealth: Payer: Self-pay | Admitting: Neurology

## 2024-07-06 NOTE — Telephone Encounter (Signed)
 Referral for neuropsychology fax to [facility]. Phone: [phone], Fax: [phone]

## 2024-07-07 LAB — HIV ANTIBODY (ROUTINE TESTING W REFLEX): HIV Screen 4th Generation wRfx: NONREACTIVE

## 2024-07-07 LAB — CBC WITH DIFFERENTIAL/PLATELET
Basophils Absolute: 0 x10E3/uL (ref 0.0–0.2)
Basos: 1 %
EOS (ABSOLUTE): 0.1 x10E3/uL (ref 0.0–0.4)
Eos: 2 %
Hematocrit: 37.2 % (ref 34.0–46.6)
Hemoglobin: 12.9 g/dL (ref 11.1–15.9)
Immature Grans (Abs): 0 x10E3/uL (ref 0.0–0.1)
Immature Granulocytes: 0 %
Lymphocytes Absolute: 1.2 x10E3/uL (ref 0.7–3.1)
Lymphs: 27 %
MCH: 32.1 pg (ref 26.6–33.0)
MCHC: 34.7 g/dL (ref 31.5–35.7)
MCV: 93 fL (ref 79–97)
Monocytes Absolute: 0.4 x10E3/uL (ref 0.1–0.9)
Monocytes: 9 %
Neutrophils Absolute: 2.7 x10E3/uL (ref 1.4–7.0)
Neutrophils: 61 %
Platelets: 192 x10E3/uL (ref 150–450)
RBC: 4.02 x10E6/uL (ref 3.77–5.28)
RDW: 11.9 % (ref 11.7–15.4)
WBC: 4.4 x10E3/uL (ref 3.4–10.8)

## 2024-07-07 LAB — QUANTIFERON-TB GOLD PLUS
QuantiFERON Mitogen Value: 0.6 [IU]/mL
QuantiFERON Nil Value: 0.04 [IU]/mL
QuantiFERON TB1 Ag Value: 0.06 [IU]/mL
QuantiFERON TB2 Ag Value: 0.06 [IU]/mL
QuantiFERON-TB Gold Plus: NEGATIVE

## 2024-07-07 LAB — HEPATIC FUNCTION PANEL
ALT: 18 IU/L (ref 0–32)
AST: 16 IU/L (ref 0–40)
Albumin: 4.3 g/dL (ref 3.9–4.9)
Alkaline Phosphatase: 126 IU/L (ref 49–135)
Bilirubin Total: 0.4 mg/dL (ref 0.0–1.2)
Bilirubin, Direct: 0.14 mg/dL (ref 0.00–0.40)
Total Protein: 6.7 g/dL (ref 6.0–8.5)

## 2024-07-07 LAB — HEPATITIS B CORE ANTIBODY, TOTAL: Hep B Core Total Ab: NEGATIVE

## 2024-07-07 LAB — HEPATITIS B SURFACE ANTIGEN: Hepatitis B Surface Ag: NEGATIVE

## 2024-07-08 DIAGNOSIS — G4733 Obstructive sleep apnea (adult) (pediatric): Secondary | ICD-10-CM | POA: Diagnosis not present

## 2024-07-11 ENCOUNTER — Ambulatory Visit: Payer: Self-pay | Admitting: Neurology

## 2024-07-11 NOTE — Telephone Encounter (Signed)
 Sater, [name] LABOR, MD to [facility] Results (Selected Message)    07/11/24 11:08 AM Result Note Labs are good to do second year of Mavenclad

## 2024-07-20 NOTE — Telephone Encounter (Signed)
 Received this from [facility]:

## 2024-07-28 ENCOUNTER — Other Ambulatory Visit: Payer: Self-pay | Admitting: Neurology

## 2024-08-02 ENCOUNTER — Telehealth: Payer: Self-pay

## 2024-08-02 ENCOUNTER — Other Ambulatory Visit (HOSPITAL_COMMUNITY): Payer: Self-pay

## 2024-08-02 NOTE — Telephone Encounter (Signed)
 Pharmacy Patient Advocate Encounter  Received notification from [facility] that Prior Authorization for [facility] has been APPROVED from 08/02/2024 to 09/16/2024   PA #/Case ID/Reference #: [id number]  KEY: [id number]

## 2024-08-03 ENCOUNTER — Other Ambulatory Visit: Payer: Self-pay

## 2024-08-03 ENCOUNTER — Other Ambulatory Visit: Payer: Self-pay | Admitting: Neurology

## 2024-08-18 NOTE — Telephone Encounter (Signed)
 Patient is due for year 2 of Mavenclad in January 2026.  PA for [place] has been approved through February 2026.  I reached out to Javie at [facility] to check on the status.

## 2024-08-21 ENCOUNTER — Other Ambulatory Visit: Payer: Self-pay | Admitting: Neurology

## 2024-08-31 ENCOUNTER — Other Ambulatory Visit: Payer: Self-pay | Admitting: Podiatry

## 2024-09-08 NOTE — Telephone Encounter (Signed)
 Per Javie at [facility]: [id number] was delivered,  pt started on 08/28/24 . Her PA  expires 09/16/24 . A PA may be needed  for M2 if she isnt able to schedule before then.

## 2024-09-08 NOTE — Telephone Encounter (Signed)
 I have reached out to [facility] again for an update.

## 2024-09-09 NOTE — Telephone Encounter (Addendum)
 I called patient to discuss if she tolerated her month 1 year 2 Mavenclad well.  No answer, left a voicemail asking her to call us  back.  Once I confirm she has completed her first month of Mavenclad, I can renew the prior authorization for month two.  If patient calls back, please confirm with her that she completed month one of her Mavenclad this year and tolerated it well.  Please let her know I will complete the prior authorization for month 2.  PA: Key: [id number]

## 2024-09-09 NOTE — Telephone Encounter (Signed)
 Patient returned my call.  She reports that she tolerated her 46-month 1 of Mavenclad well.  I have submitted the PA for Mavenclad to [facility] via CMM.  Should have a determination within 3-5 business days. Key: [id number]

## 2024-09-14 NOTE — Telephone Encounter (Signed)
 PA for Mavenclad was approved:   I have informed Javie at [employer].
